# Patient Record
Sex: Male | Born: 1950
Health system: Southern US, Community
[De-identification: ages and names within clinical notes are randomized; demographics above are authoritative.]

## PROBLEM LIST (undated history)

## (undated) DIAGNOSIS — G4733 Obstructive sleep apnea (adult) (pediatric): Secondary | ICD-10-CM

## (undated) DIAGNOSIS — Z974 Presence of external hearing-aid: Secondary | ICD-10-CM

## (undated) DIAGNOSIS — K649 Unspecified hemorrhoids: Secondary | ICD-10-CM

## (undated) DIAGNOSIS — I4901 Ventricular fibrillation: Secondary | ICD-10-CM

## (undated) DIAGNOSIS — C4490 Unspecified malignant neoplasm of skin, unspecified: Secondary | ICD-10-CM

## (undated) DIAGNOSIS — K219 Gastro-esophageal reflux disease without esophagitis: Secondary | ICD-10-CM

## (undated) DIAGNOSIS — M1990 Unspecified osteoarthritis, unspecified site: Secondary | ICD-10-CM

## (undated) DIAGNOSIS — I255 Ischemic cardiomyopathy: Secondary | ICD-10-CM

## (undated) DIAGNOSIS — R0601 Orthopnea: Secondary | ICD-10-CM

## (undated) DIAGNOSIS — I469 Cardiac arrest, cause unspecified: Secondary | ICD-10-CM

## (undated) DIAGNOSIS — J45909 Unspecified asthma, uncomplicated: Secondary | ICD-10-CM

## (undated) DIAGNOSIS — H9190 Unspecified hearing loss, unspecified ear: Secondary | ICD-10-CM

## (undated) DIAGNOSIS — I2510 Atherosclerotic heart disease of native coronary artery without angina pectoris: Secondary | ICD-10-CM

## (undated) DIAGNOSIS — I10 Essential (primary) hypertension: Secondary | ICD-10-CM

## (undated) DIAGNOSIS — E119 Type 2 diabetes mellitus without complications: Secondary | ICD-10-CM

## (undated) DIAGNOSIS — H269 Unspecified cataract: Secondary | ICD-10-CM

## (undated) DIAGNOSIS — E785 Hyperlipidemia, unspecified: Secondary | ICD-10-CM

## (undated) DIAGNOSIS — I2109 ST elevation (STEMI) myocardial infarction involving other coronary artery of anterior wall: Secondary | ICD-10-CM

## (undated) HISTORY — PX: CORONARY ANGIOPLASTY: SHX604

## (undated) HISTORY — PX: EYE SURGERY: SHX253

## (undated) HISTORY — PX: CHOLECYSTECTOMY: SHX55

## (undated) HISTORY — PX: OTHER SURGICAL HISTORY: SHX169

---

## 2004-10-04 ENCOUNTER — Ambulatory Visit: Payer: Self-pay | Admitting: Internal Medicine

## 2006-03-15 ENCOUNTER — Ambulatory Visit: Payer: Self-pay

## 2007-07-22 ENCOUNTER — Ambulatory Visit: Payer: Self-pay

## 2007-07-25 DIAGNOSIS — Z85828 Personal history of other malignant neoplasm of skin: Secondary | ICD-10-CM

## 2007-07-25 HISTORY — DX: Personal history of other malignant neoplasm of skin: Z85.828

## 2007-07-29 ENCOUNTER — Ambulatory Visit: Payer: Self-pay

## 2008-06-04 ENCOUNTER — Ambulatory Visit: Payer: Self-pay | Admitting: Internal Medicine

## 2008-10-15 ENCOUNTER — Ambulatory Visit: Payer: Self-pay | Admitting: General Practice

## 2009-04-15 ENCOUNTER — Ambulatory Visit: Payer: Self-pay | Admitting: Gastroenterology

## 2009-05-24 ENCOUNTER — Ambulatory Visit: Payer: Self-pay | Admitting: General Practice

## 2009-11-08 ENCOUNTER — Ambulatory Visit: Payer: Self-pay | Admitting: Internal Medicine

## 2012-04-10 ENCOUNTER — Ambulatory Visit: Payer: Self-pay | Admitting: Internal Medicine

## 2012-11-27 HISTORY — PX: COLONOSCOPY: SHX174

## 2013-08-26 ENCOUNTER — Ambulatory Visit: Payer: Self-pay | Admitting: Internal Medicine

## 2014-04-09 DIAGNOSIS — K219 Gastro-esophageal reflux disease without esophagitis: Secondary | ICD-10-CM | POA: Insufficient documentation

## 2016-03-31 NOTE — H&P (Signed)
  See scanned notes. 

## 2016-04-03 ENCOUNTER — Ambulatory Visit
Admission: RE | Admit: 2016-04-03 | Discharge: 2016-04-03 | Disposition: A | Discharge status: Home / Self Care | Payer: BLUE CROSS/BLUE SHIELD | Source: Ambulatory Visit | Attending: Ophthalmology | Admitting: Ophthalmology

## 2016-04-03 ENCOUNTER — Encounter: Payer: Self-pay | Admitting: *Deleted

## 2016-04-03 ENCOUNTER — Ambulatory Visit: Payer: BLUE CROSS/BLUE SHIELD | Admitting: Certified Registered Nurse Anesthetist

## 2016-04-03 ENCOUNTER — Encounter
Admission: RE | Disposition: A | Discharge status: Home / Self Care | Payer: Self-pay | Source: Ambulatory Visit | Attending: Ophthalmology

## 2016-04-03 DIAGNOSIS — I10 Essential (primary) hypertension: Secondary | ICD-10-CM | POA: Insufficient documentation

## 2016-04-03 DIAGNOSIS — H2511 Age-related nuclear cataract, right eye: Secondary | ICD-10-CM | POA: Insufficient documentation

## 2016-04-03 DIAGNOSIS — M1990 Unspecified osteoarthritis, unspecified site: Secondary | ICD-10-CM | POA: Diagnosis not present

## 2016-04-03 DIAGNOSIS — G4730 Sleep apnea, unspecified: Secondary | ICD-10-CM | POA: Diagnosis not present

## 2016-04-03 DIAGNOSIS — J45909 Unspecified asthma, uncomplicated: Secondary | ICD-10-CM | POA: Insufficient documentation

## 2016-04-03 HISTORY — DX: Unspecified osteoarthritis, unspecified site: M19.90

## 2016-04-03 HISTORY — DX: Orthopnea: R06.01

## 2016-04-03 HISTORY — DX: Unspecified malignant neoplasm of skin, unspecified: C44.90

## 2016-04-03 HISTORY — PX: CATARACT EXTRACTION W/PHACO: SHX586

## 2016-04-03 HISTORY — DX: Unspecified asthma, uncomplicated: J45.909

## 2016-04-03 HISTORY — DX: Gastro-esophageal reflux disease without esophagitis: K21.9

## 2016-04-03 HISTORY — DX: Unspecified hearing loss, unspecified ear: H91.90

## 2016-04-03 HISTORY — DX: Type 2 diabetes mellitus without complications: E11.9

## 2016-04-03 LAB — GLUCOSE, CAPILLARY: Glucose-Capillary: 119 mg/dL — ABNORMAL HIGH (ref 65–99)

## 2016-04-03 SURGERY — PHACOEMULSIFICATION, CATARACT, WITH IOL INSERTION
Anesthesia: Monitor Anesthesia Care | Site: Eye | Laterality: Right | Wound class: Clean

## 2016-04-03 MED ORDER — MOXIFLOXACIN HCL 0.5 % OP SOLN
1.0000 [drp] | OPHTHALMIC | Status: AC
  Administered 2016-04-03 (×2): 1 [drp] via OPHTHALMIC

## 2016-04-03 MED ORDER — CEFUROXIME OPHTHALMIC INJECTION 1 MG/0.1 ML
INJECTION | OPHTHALMIC | Status: AC
  Filled 2016-04-03: qty 0.1

## 2016-04-03 MED ORDER — NA CHONDROIT SULF-NA HYALURON 40-17 MG/ML IO SOLN
INTRAOCULAR | Status: DC | PRN
  Administered 2016-04-03: 1 mL via INTRAOCULAR

## 2016-04-03 MED ORDER — LIDOCAINE HCL (PF) 4 % IJ SOLN
INTRAMUSCULAR | Status: AC
  Filled 2016-04-03: qty 5

## 2016-04-03 MED ORDER — MOXIFLOXACIN HCL 0.5 % OP SOLN
OPHTHALMIC | Status: DC | PRN
  Administered 2016-04-03: 1 [drp] via OPHTHALMIC

## 2016-04-03 MED ORDER — POVIDONE-IODINE 5 % OP SOLN
OPHTHALMIC | Status: DC | PRN
  Administered 2016-04-03: 1 via OPHTHALMIC

## 2016-04-03 MED ORDER — LIDOCAINE HCL (PF) 4 % IJ SOLN
INTRAOCULAR | Status: DC | PRN
  Administered 2016-04-03: .5 mL via OPHTHALMIC

## 2016-04-03 MED ORDER — PHENYLEPHRINE HCL 10 % OP SOLN
1.0000 [drp] | Freq: Once | OPHTHALMIC | Status: AC
  Administered 2016-04-03: 1 [drp] via OPHTHALMIC

## 2016-04-03 MED ORDER — CARBACHOL 0.01 % IO SOLN
INTRAOCULAR | Status: DC | PRN
  Administered 2016-04-03: .5 mL via INTRAOCULAR

## 2016-04-03 MED ORDER — NA CHONDROIT SULF-NA HYALURON 40-17 MG/ML IO SOLN
INTRAOCULAR | Status: AC
  Filled 2016-04-03: qty 1

## 2016-04-03 MED ORDER — BUPIVACAINE HCL (PF) 0.75 % IJ SOLN
INTRAMUSCULAR | Status: AC
  Filled 2016-04-03: qty 10

## 2016-04-03 MED ORDER — MOXIFLOXACIN HCL 0.5 % OP SOLN
1.0000 [drp] | OPHTHALMIC | Status: DC | PRN
  Administered 2016-04-03: 1 [drp] via OPHTHALMIC

## 2016-04-03 MED ORDER — EPINEPHRINE HCL 1 MG/ML IJ SOLN
INTRAMUSCULAR | Status: AC
  Filled 2016-04-03: qty 2

## 2016-04-03 MED ORDER — POVIDONE-IODINE 5 % OP SOLN
OPHTHALMIC | Status: AC
  Filled 2016-04-03: qty 30

## 2016-04-03 MED ORDER — CYCLOPENTOLATE HCL 2 % OP SOLN
OPHTHALMIC | Status: AC
  Administered 2016-04-03: 1 [drp] via OPHTHALMIC
  Filled 2016-04-03: qty 2

## 2016-04-03 MED ORDER — TETRACAINE HCL 0.5 % OP SOLN
OPHTHALMIC | Status: DC | PRN
  Administered 2016-04-03: 1 [drp] via OPHTHALMIC

## 2016-04-03 MED ORDER — PHENYLEPHRINE HCL 10 % OP SOLN
1.0000 [drp] | OPHTHALMIC | Status: AC
  Administered 2016-04-03 (×3): 1 [drp] via OPHTHALMIC

## 2016-04-03 MED ORDER — MIDAZOLAM HCL 2 MG/2ML IJ SOLN
INTRAMUSCULAR | Status: DC | PRN
  Administered 2016-04-03: 1 mg via INTRAVENOUS

## 2016-04-03 MED ORDER — TETRACAINE HCL 0.5 % OP SOLN
OPHTHALMIC | Status: AC
  Filled 2016-04-03: qty 2

## 2016-04-03 MED ORDER — HYALURONIDASE HUMAN 150 UNIT/ML IJ SOLN
INTRAMUSCULAR | Status: AC
  Filled 2016-04-03: qty 1

## 2016-04-03 MED ORDER — MOXIFLOXACIN HCL 0.5 % OP SOLN
OPHTHALMIC | Status: AC
  Administered 2016-04-03: 1 [drp] via OPHTHALMIC
  Filled 2016-04-03: qty 3

## 2016-04-03 MED ORDER — PHENYLEPHRINE HCL 10 % OP SOLN
OPHTHALMIC | Status: AC
  Administered 2016-04-03: 1 [drp] via OPHTHALMIC
  Filled 2016-04-03: qty 5

## 2016-04-03 MED ORDER — EPINEPHRINE HCL 1 MG/ML IJ SOLN
INTRAOCULAR | Status: DC | PRN
  Administered 2016-04-03: 1 mL via OPHTHALMIC

## 2016-04-03 MED ORDER — CEFUROXIME OPHTHALMIC INJECTION 1 MG/0.1 ML
INJECTION | OPHTHALMIC | Status: DC | PRN
  Administered 2016-04-03: .1 mL via INTRACAMERAL

## 2016-04-03 MED ORDER — SODIUM CHLORIDE 0.9 % IV SOLN
INTRAVENOUS | Status: DC
  Administered 2016-04-03: 75 mL/h via INTRAVENOUS

## 2016-04-03 MED ORDER — LIDOCAINE HCL (PF) 4 % IJ SOLN
INTRAMUSCULAR | Status: DC | PRN
  Administered 2016-04-03: 4 mL via OPHTHALMIC

## 2016-04-03 MED ORDER — CYCLOPENTOLATE HCL 2 % OP SOLN
1.0000 [drp] | Freq: Once | OPHTHALMIC | Status: AC
  Administered 2016-04-03: 1 [drp] via OPHTHALMIC

## 2016-04-03 MED ORDER — CYCLOPENTOLATE HCL 2 % OP SOLN
1.0000 [drp] | OPHTHALMIC | Status: AC
  Administered 2016-04-03 (×3): 1 [drp] via OPHTHALMIC

## 2016-04-03 MED ORDER — ALFENTANIL 500 MCG/ML IJ INJ
INJECTION | INTRAMUSCULAR | Status: DC | PRN
  Administered 2016-04-03: 200 ug via INTRAVENOUS
  Administered 2016-04-03: 500 ug via INTRAVENOUS

## 2016-04-03 SURGICAL SUPPLY — 29 items
CANNULA ANT/CHMB 27GA (MISCELLANEOUS) ×2 IMPLANT
CORD BIP STRL DISP 12FT (MISCELLANEOUS) ×2 IMPLANT
CUP MEDICINE 2OZ PLAST GRAD ST (MISCELLANEOUS) ×2 IMPLANT
DRAPE XRAY CASSETTE 23X24 (DRAPES) ×2 IMPLANT
ERASER HMR WETFIELD 18G (MISCELLANEOUS) ×2 IMPLANT
GLOVE BIO SURGEON STRL SZ8 (GLOVE) ×2 IMPLANT
GLOVE SURG LX 6.5 MICRO (GLOVE) ×1
GLOVE SURG LX 8.0 MICRO (GLOVE) ×1
GLOVE SURG LX STRL 6.5 MICRO (GLOVE) ×1 IMPLANT
GLOVE SURG LX STRL 8.0 MICRO (GLOVE) ×1 IMPLANT
GOWN STRL REUS W/ TWL LRG LVL3 (GOWN DISPOSABLE) ×1 IMPLANT
GOWN STRL REUS W/ TWL XL LVL3 (GOWN DISPOSABLE) ×1 IMPLANT
GOWN STRL REUS W/TWL LRG LVL3 (GOWN DISPOSABLE) ×1
GOWN STRL REUS W/TWL XL LVL3 (GOWN DISPOSABLE) ×1
LENS IOL ACRYSOF IQ 20.5 (Intraocular Lens) ×2 IMPLANT
PACK CATARACT (MISCELLANEOUS) ×2 IMPLANT
PACK CATARACT DINGLEDEIN LX (MISCELLANEOUS) ×2 IMPLANT
PACK EYE AFTER SURG (MISCELLANEOUS) ×2 IMPLANT
SHLD EYE VISITEC  UNIV (MISCELLANEOUS) ×2 IMPLANT
SOL BSS BAG (MISCELLANEOUS) ×2
SOL PREP PVP 2OZ (MISCELLANEOUS) ×2
SOLUTION BSS BAG (MISCELLANEOUS) ×1 IMPLANT
SOLUTION PREP PVP 2OZ (MISCELLANEOUS) ×1 IMPLANT
SUT SILK 5-0 (SUTURE) ×2 IMPLANT
SYR 3ML LL SCALE MARK (SYRINGE) ×2 IMPLANT
SYR 5ML LL (SYRINGE) ×2 IMPLANT
SYR TB 1ML 27GX1/2 LL (SYRINGE) ×2 IMPLANT
WATER STERILE IRR 1000ML POUR (IV SOLUTION) ×2 IMPLANT
WIPE NON LINTING 3.25X3.25 (MISCELLANEOUS) ×2 IMPLANT

## 2016-04-03 NOTE — Discharge Instructions (Addendum)
See handout Eye Surgery Discharge Instructions  Expect mild scratchy sensation or mild soreness. DO NOT RUB YOUR EYE!  The day of surgery:  Minimal physical activity, but bed rest is not required  No reading, computer work, or close hand work  No bending, lifting, or straining.  May watch TV  For 24 hours:  No driving, legal decisions, or alcoholic beverages  Safety precautions  Eat anything you prefer: It is better to start with liquids, then soup then solid foods.  _____ Eye patch should be worn until postoperative exam tomorrow.  ____ Solar shield eyeglasses should be worn for comfort in the sunlight/patch while sleeping  Resume all regular medications including aspirin or Coumadin if these were discontinued prior to surgery. You may shower, bathe, shave, or wash your hair. Tylenol may be taken for mild discomfort.  Call your doctor if you experience significant pain, nausea, or vomiting, fever > 101 or other signs of infection. [phone] or [phone] Specific instructions:  Follow-up Information    Follow up with Estill Cotta, MD.   Specialty:  Ophthalmology   Why:  5-09-17u at 11:00   Contact information:   [place] [address] [phone]      Eye Surgery Discharge Instructions  Expect mild scratchy sensation or mild soreness. DO NOT RUB YOUR EYE!  The day of surgery:  Minimal physical activity, but bed rest is not required  No reading, computer work, or close hand work  No bending, lifting, or straining.  May watch TV  For 24 hours:  No driving, legal decisions, or alcoholic beverages  Safety precautions  Eat anything you prefer: It is better to start with liquids, then soup then solid foods.  _____ Eye patch should be worn until postoperative exam tomorrow.  ____ Solar shield eyeglasses should be worn for comfort in the sunlight/patch while sleeping  Resume all regular medications including aspirin or  Coumadin if these were discontinued prior to surgery. You may shower, bathe, shave, or wash your hair. Tylenol may be taken for mild discomfort.  Call your doctor if you experience significant pain, nausea, or vomiting, fever > 101 or other signs of infection. [phone] or [phone] Specific instructions:  Follow-up Information    Follow up with Estill Cotta, MD.   Specialty:  Ophthalmology   Why:  5-09-17u at 11:00   Contact information:   [address]   [place] [address] [phone]

## 2016-04-03 NOTE — Interval H&P Note (Signed)
History and Physical Interval Note:  04/03/2016 9:00 AM  Bobby Tanner  has presented today for surgery, with the diagnosis of CATARACT  The various methods of treatment have been discussed with the patient and family. After consideration of risks, benefits and other options for treatment, the patient has consented to  Procedure(s): CATARACT EXTRACTION PHACO AND INTRAOCULAR LENS PLACEMENT ([place]) (Right) as a surgical intervention .  The patient's history has been reviewed, patient examined, no change in status, stable for surgery.  I have reviewed the patient's chart and labs.  Questions were answered to the patient's satisfaction.     Shandon Matson

## 2016-04-03 NOTE — Anesthesia Postprocedure Evaluation (Signed)
Anesthesia Post Note  Patient: Bobby Tanner  Procedure(s) Performed: Procedure(s) (LRB): CATARACT EXTRACTION PHACO AND INTRAOCULAR LENS PLACEMENT ([facility]) (Right)  Patient location during evaluation: PACU Anesthesia Type: MAC Level of consciousness: awake and alert and oriented Pain management: satisfactory to patient Vital Signs Assessment: post-procedure vital signs reviewed and stable Respiratory status: respiratory function stable Cardiovascular status: stable    Last Vitals:  Filed Vitals:   04/03/16 0743  BP: 130/74  Pulse: 67  Temp: 37.6 C  Resp: 18    Last Pain: There were no vitals filed for this visit.               Blima Singer

## 2016-04-03 NOTE — Anesthesia Procedure Notes (Signed)
Performed by: Daneli Butkiewicz Pre-anesthesia Checklist: Patient identified, Emergency Drugs available, Suction available, Patient being monitored and Timeout performed Oxygen Delivery Method: Nasal cannula       

## 2016-04-03 NOTE — Transfer of Care (Signed)
Immediate Anesthesia Transfer of Care Note  Patient: Bobby Tanner  Procedure(s) Performed: Procedure(s) with comments: CATARACT EXTRACTION PHACO AND INTRAOCULAR LENS PLACEMENT ([facility]) (Right) - [place] 01:13 AP% 25.8 CDE 34.18 fluid pack lot [id number]  Patient Location: PACU  Anesthesia Type:MAC  Level of Consciousness: awake, alert  and oriented  Airway & Oxygen Therapy: Patient Spontanous Breathing  Post-op Assessment: Report given to RN and Post -op Vital signs reviewed and stable  Post vital signs: Reviewed and stable  Last Vitals:  Filed Vitals:   04/03/16 0743  BP: 130/74  Pulse: 67  Temp: 37.6 C  Resp: 18    Last Pain: There were no vitals filed for this visit.    Patients Stated Pain Goal: 0 ([id number])  Complications: No apparent anesthesia complications

## 2016-04-03 NOTE — Op Note (Signed)
Date of Surgery: 04/03/2016 Date of Dictation: 04/03/2016 9:45 AM Pre-operative Diagnosis:  Nuclear Sclerotic Cataract right Eye Post-operative Diagnosis: same Procedure performed: Extra-capsular Cataract Extraction (ECCE) with placement of a posterior chamber intraocular lens (IOL) right Eye IOL:  Implant Name Type Inv. Item Serial No. Manufacturer Lot No. LRB No. Used  LENS IOL ACRYSOF IQ 20.5 - [id number] Intraocular Lens LENS IOL ACRYSOF IQ 20.5 [id number] ALCON   Right 1   Anesthesia: 2% Lidocaine and 4% Marcaine in a 50/50 mixture with 10 unites/ml of Hylenex given as a peribulbar Anesthesiologist: Anesthesiologist: Gunnar Bulla, MD CRNA: Demetrius Charity, CRNA Complications: none Estimated Blood Loss: less than 1 ml  Description of procedure:  The patient was given anesthesia and sedation via intravenous access. The patient was then prepped and draped in the usual fashion. A 25-gauge needle was bent for initiating the capsulorhexis. A 5-0 silk suture was placed through the conjunctiva superior and inferiorly to serve as bridle sutures. Hemostasis was obtained at the superior limbus using an eraser cautery. A partial thickness groove was made at the anterior surgical limbus with a 64 Beaver blade and this was dissected anteriorly with an [employer]aya. The anterior chamber was entered at 10 o'clock with a 1.0 mm paracentesis knife and through the lamellar dissection with a 2.6 mm Alcon keratome. Epi-Shugarcaine 0.5 CC [9 cc BSS Plus (Alcon), 3 cc 4% preservative-free lidocaine (Hospira) and 4 cc 1:1000 preservative-free, bisulfite-free epinephrine] was injected into the anterior chamber via the paracentesis tract. Epi-Shugarcaine 0.5 CC [9 cc BSS Plus (Alcon), 3 cc 4% preservative-free lidocaine (Hospira) and 4 cc 1:1000 preservative-free, bisulfite-free epinephrine] was injected into the anterior chamber via the paracentesis tract. DiscoVisc was injected to replace the aqueous and a  continuous tear curvilinear capsulorhexis was performed using a bent 25-gauge needle.  Balance salt on a syringe was used to perform hydro-dissection and phacoemulsification was carried out using a divide and conquer technique. Procedure(s) with comments: CATARACT EXTRACTION PHACO AND INTRAOCULAR LENS PLACEMENT ([facility]) (Right) - [place] 01:13 AP% 25.8 CDE 34.18 fluid pack lot [id number]. Irrigation/aspiration was used to remove the residual cortex and the capsular bag was inflated with DiscoVisc. The intraocular lens was inserted into the capsular bag using a pre-loaded UltraSert Delivery System. Irrigation/aspiration was used to remove the residual DiscoVisc. The wound was inflated with balanced salt and checked for leaks. None were found. Miostat was injected via the paracentesis track and 0.1 ml of cefuroxime containing 1 mg of drug  was injected via the paracentesis track. The wound was checked for leaks again and none were found.   The bridal sutures were removed and two drops of Vigamox were placed on the eye. An eye shield was placed to protect the eye and the patient was discharged to the recovery area in good condition.   Julian Askin MD

## 2016-04-03 NOTE — Anesthesia Preprocedure Evaluation (Signed)
Anesthesia Evaluation  Patient identified by MRN, date of birth, ID band Patient awake    Reviewed: Allergy & Precautions, NPO status , Patient's Chart, lab work & pertinent test results, reviewed documented beta blocker date and time   Airway Mallampati: III  TM Distance: >3 FB     Dental  (+) Chipped   Pulmonary shortness of breath, asthma , sleep apnea ,           Cardiovascular hypertension, Pt. on medications + angina + Orthopnea       Neuro/Psych    GI/Hepatic GERD  Controlled,  Endo/Other    Renal/GU      Musculoskeletal  (+) Arthritis ,   Abdominal   Peds  Hematology   Anesthesia Other Findings   Reproductive/Obstetrics                             Anesthesia Physical Anesthesia Plan  ASA: III  Anesthesia Plan: General and MAC   Post-op Pain Management:    Induction:   Airway Management Planned:   Additional Equipment:   Intra-op Plan:   Post-operative Plan:   Informed Consent: I have reviewed the patients History and Physical, chart, labs and discussed the procedure including the risks, benefits and alternatives for the proposed anesthesia with the patient or authorized representative who has indicated his/her understanding and acceptance.     Plan Discussed with: CRNA  Anesthesia Plan Comments:         Anesthesia Quick Evaluation

## 2016-04-10 ENCOUNTER — Inpatient Hospital Stay (HOSPITAL_COMMUNITY)
Admit: 2016-04-10 | Discharge: 2016-04-10 | Disposition: A | Discharge status: Home / Self Care | Payer: BLUE CROSS/BLUE SHIELD | Attending: Nurse Practitioner | Admitting: Nurse Practitioner

## 2016-04-10 ENCOUNTER — Emergency Department: Payer: BLUE CROSS/BLUE SHIELD

## 2016-04-10 ENCOUNTER — Inpatient Hospital Stay
Admission: EM | Admit: 2016-04-10 | Discharge: 2016-04-12 | DRG: 246 | Disposition: A | Discharge status: Home / Self Care | Payer: BLUE CROSS/BLUE SHIELD | Attending: Cardiovascular Disease | Admitting: Cardiovascular Disease

## 2016-04-10 ENCOUNTER — Encounter
Admission: EM | Disposition: A | Discharge status: Home / Self Care | Payer: Self-pay | Source: Home / Self Care | Attending: Cardiovascular Disease

## 2016-04-10 DIAGNOSIS — I213 ST elevation (STEMI) myocardial infarction of unspecified site: Secondary | ICD-10-CM | POA: Diagnosis not present

## 2016-04-10 DIAGNOSIS — I2510 Atherosclerotic heart disease of native coronary artery without angina pectoris: Secondary | ICD-10-CM | POA: Diagnosis present

## 2016-04-10 DIAGNOSIS — D72829 Elevated white blood cell count, unspecified: Secondary | ICD-10-CM | POA: Diagnosis present

## 2016-04-10 DIAGNOSIS — K219 Gastro-esophageal reflux disease without esophagitis: Secondary | ICD-10-CM | POA: Diagnosis present

## 2016-04-10 DIAGNOSIS — E785 Hyperlipidemia, unspecified: Secondary | ICD-10-CM | POA: Diagnosis present

## 2016-04-10 DIAGNOSIS — Z6841 Body Mass Index (BMI) 40.0 and over, adult: Secondary | ICD-10-CM | POA: Diagnosis not present

## 2016-04-10 DIAGNOSIS — I2102 ST elevation (STEMI) myocardial infarction involving left anterior descending coronary artery: Secondary | ICD-10-CM | POA: Diagnosis not present

## 2016-04-10 DIAGNOSIS — I10 Essential (primary) hypertension: Secondary | ICD-10-CM | POA: Diagnosis present

## 2016-04-10 DIAGNOSIS — I462 Cardiac arrest due to underlying cardiac condition: Secondary | ICD-10-CM | POA: Diagnosis present

## 2016-04-10 DIAGNOSIS — Z8249 Family history of ischemic heart disease and other diseases of the circulatory system: Secondary | ICD-10-CM | POA: Diagnosis not present

## 2016-04-10 DIAGNOSIS — I472 Ventricular tachycardia, unspecified: Secondary | ICD-10-CM

## 2016-04-10 DIAGNOSIS — I469 Cardiac arrest, cause unspecified: Secondary | ICD-10-CM | POA: Diagnosis not present

## 2016-04-10 DIAGNOSIS — Z85828 Personal history of other malignant neoplasm of skin: Secondary | ICD-10-CM | POA: Diagnosis not present

## 2016-04-10 DIAGNOSIS — Z820 Family history of epilepsy and other diseases of the nervous system: Secondary | ICD-10-CM

## 2016-04-10 DIAGNOSIS — R079 Chest pain, unspecified: Secondary | ICD-10-CM | POA: Diagnosis present

## 2016-04-10 DIAGNOSIS — I2109 ST elevation (STEMI) myocardial infarction involving other coronary artery of anterior wall: Principal | ICD-10-CM

## 2016-04-10 DIAGNOSIS — R112 Nausea with vomiting, unspecified: Secondary | ICD-10-CM | POA: Diagnosis present

## 2016-04-10 DIAGNOSIS — H9190 Unspecified hearing loss, unspecified ear: Secondary | ICD-10-CM | POA: Diagnosis present

## 2016-04-10 DIAGNOSIS — G4733 Obstructive sleep apnea (adult) (pediatric): Secondary | ICD-10-CM | POA: Diagnosis present

## 2016-04-10 DIAGNOSIS — Z7982 Long term (current) use of aspirin: Secondary | ICD-10-CM | POA: Diagnosis not present

## 2016-04-10 DIAGNOSIS — Z79899 Other long term (current) drug therapy: Secondary | ICD-10-CM

## 2016-04-10 DIAGNOSIS — I4901 Ventricular fibrillation: Secondary | ICD-10-CM

## 2016-04-10 DIAGNOSIS — E119 Type 2 diabetes mellitus without complications: Secondary | ICD-10-CM

## 2016-04-10 DIAGNOSIS — I209 Angina pectoris, unspecified: Secondary | ICD-10-CM | POA: Diagnosis not present

## 2016-04-10 HISTORY — DX: Unspecified hemorrhoids: K64.9

## 2016-04-10 HISTORY — DX: Morbid (severe) obesity due to excess calories: E66.01

## 2016-04-10 HISTORY — DX: Ischemic cardiomyopathy: I25.5

## 2016-04-10 HISTORY — DX: Obstructive sleep apnea (adult) (pediatric): G47.33

## 2016-04-10 HISTORY — DX: Unspecified cataract: H26.9

## 2016-04-10 HISTORY — DX: Atherosclerotic heart disease of native coronary artery without angina pectoris: I25.10

## 2016-04-10 HISTORY — DX: Ventricular fibrillation: I49.01

## 2016-04-10 HISTORY — DX: ST elevation (STEMI) myocardial infarction involving other coronary artery of anterior wall: I21.09

## 2016-04-10 HISTORY — PX: CARDIAC CATHETERIZATION: SHX172

## 2016-04-10 HISTORY — DX: Cardiac arrest, cause unspecified: I46.9

## 2016-04-10 HISTORY — DX: Essential (primary) hypertension: I10

## 2016-04-10 HISTORY — DX: Hyperlipidemia, unspecified: E78.5

## 2016-04-10 LAB — BASIC METABOLIC PANEL
ANION GAP: 9 (ref 5–15)
BUN: 20 mg/dL (ref 6–20)
CALCIUM: 9.5 mg/dL (ref 8.9–10.3)
CO2: 21 mmol/L — AB (ref 22–32)
Chloride: 111 mmol/L (ref 101–111)
Creatinine, Ser: 1.2 mg/dL (ref 0.61–1.24)
GFR calc Af Amer: >60 mL/min (ref >60)
GFR calc non Af Amer: >60 mL/min (ref >60)
Glucose, Bld: 151 mg/dL — ABNORMAL HIGH (ref 65–99)
Potassium: 3.9 mmol/L (ref 3.5–5.1)
Sodium: 141 mmol/L (ref 135–145)

## 2016-04-10 LAB — CBC
HCT: 44.1 % (ref 40.0–52.0)
HEMATOCRIT: 45.4 % (ref 40.0–52.0)
HEMOGLOBIN: 15.3 g/dL (ref 13.0–18.0)
Hemoglobin: 14.7 g/dL (ref 13.0–18.0)
MCH: 29.7 pg (ref 26.0–34.0)
MCH: 30.3 pg (ref 26.0–34.0)
MCHC: 33.7 g/dL (ref 32.0–36.0)
MCHC: 33.3 g/dL (ref 32.0–36.0)
MCV: 88.1 fL (ref 80.0–100.0)
MCV: 91.2 fL (ref 80.0–100.0)
PLATELETS: 193 10*3/uL (ref 150–440)
Platelets: 202 10*3/uL (ref 150–440)
RBC: 5.15 MIL/uL (ref 4.40–5.90)
RBC: 4.83 MIL/uL (ref 4.40–5.90)
RDW: 13.4 % (ref 11.5–14.5)
RDW: 13.5 % (ref 11.5–14.5)
WBC: 15 10*3/uL — AB (ref 3.8–10.6)
WBC: 19.5 10*3/uL — ABNORMAL HIGH (ref 3.8–10.6)

## 2016-04-10 LAB — CREATININE, SERUM
CREATININE: 1.48 mg/dL — AB (ref 0.61–1.24)
GFR calc Af Amer: 56 mL/min — ABNORMAL LOW (ref >60)
GFR calc non Af Amer: 48 mL/min — ABNORMAL LOW (ref >60)

## 2016-04-10 LAB — MRSA PCR SCREENING: MRSA BY PCR: NEGATIVE

## 2016-04-10 LAB — GLUCOSE, CAPILLARY
GLUCOSE-CAPILLARY: 172 mg/dL — AB (ref 65–99)
Glucose-Capillary: 221 mg/dL — ABNORMAL HIGH (ref 65–99)

## 2016-04-10 LAB — TROPONIN I: TROPONIN I: 0.66 ng/mL — AB (ref <0.031)

## 2016-04-10 LAB — TSH: TSH: 2.357 u[IU]/mL (ref 0.350–4.500)

## 2016-04-10 LAB — APTT

## 2016-04-10 SURGERY — LEFT HEART CATH AND CORONARY ANGIOGRAPHY
Anesthesia: Moderate Sedation

## 2016-04-10 MED ORDER — BIVALIRUDIN 250 MG IV SOLR
250.0000 mg | INTRAVENOUS | Status: DC | PRN
  Administered 2016-04-10: 1.75 mg/kg/h via INTRAVENOUS

## 2016-04-10 MED ORDER — NITROGLYCERIN 5 MG/ML IV SOLN
INTRAVENOUS | Status: AC
  Filled 2016-04-10: qty 10

## 2016-04-10 MED ORDER — HEPARIN (PORCINE) IN NACL 2-0.9 UNIT/ML-% IJ SOLN
INTRAMUSCULAR | Status: AC
  Filled 2016-04-10: qty 500

## 2016-04-10 MED ORDER — HEPARIN SODIUM (PORCINE) 1000 UNIT/ML IJ SOLN
INTRAMUSCULAR | Status: AC
  Filled 2016-04-10: qty 1

## 2016-04-10 MED ORDER — ONDANSETRON HCL 4 MG/2ML IJ SOLN
4.0000 mg | Freq: Four times a day (QID) | INTRAMUSCULAR | Status: DC | PRN
  Filled 2016-04-10: qty 2

## 2016-04-10 MED ORDER — TICAGRELOR 90 MG PO TABS
ORAL_TABLET | ORAL | Status: DC | PRN
  Administered 2016-04-10: 180 mg via ORAL

## 2016-04-10 MED ORDER — FLUTICASONE PROPIONATE 50 MCG/ACT NA SUSP
2.0000 | Freq: Every day | NASAL | Status: DC
  Administered 2016-04-11: 2 via NASAL
  Filled 2016-04-10: qty 16

## 2016-04-10 MED ORDER — ASPIRIN EC 81 MG PO TBEC
DELAYED_RELEASE_TABLET | ORAL | Status: AC
  Filled 2016-04-10: qty 1

## 2016-04-10 MED ORDER — ONDANSETRON HCL 4 MG/2ML IJ SOLN
4.0000 mg | Freq: Once | INTRAMUSCULAR | Status: AC
  Administered 2016-04-10: 4 mg via INTRAVENOUS
  Filled 2016-04-10: qty 2

## 2016-04-10 MED ORDER — IRBESARTAN 150 MG PO TABS
150.0000 mg | ORAL_TABLET | Freq: Every day | ORAL | Status: DC
  Administered 2016-04-11 – 2016-04-12 (×2): 150 mg via ORAL
  Filled 2016-04-10: qty 2
  Filled 2016-04-10: qty 1

## 2016-04-10 MED ORDER — SODIUM CHLORIDE 0.9% FLUSH: 3.0000 mL | INTRAVENOUS | Status: DC | PRN

## 2016-04-10 MED ORDER — NITROGLYCERIN 1 MG/10 ML FOR IR/CATH LAB
INTRA_ARTERIAL | Status: DC | PRN
  Administered 2016-04-10: 200 ug

## 2016-04-10 MED ORDER — ONDANSETRON HCL 4 MG/2ML IJ SOLN: 4.0000 mg | Freq: Four times a day (QID) | INTRAMUSCULAR | Status: DC | PRN

## 2016-04-10 MED ORDER — BIVALIRUDIN 250 MG IV SOLR
INTRAVENOUS | Status: AC
  Filled 2016-04-10: qty 250

## 2016-04-10 MED ORDER — HEPARIN BOLUS VIA INFUSION
4000.0000 [IU] | Freq: Once | INTRAVENOUS | Status: AC
  Administered 2016-04-10: 4000 [IU] via INTRAVENOUS
  Filled 2016-04-10: qty 4000

## 2016-04-10 MED ORDER — FENTANYL CITRATE (PF) 100 MCG/2ML IJ SOLN
INTRAMUSCULAR | Status: AC
  Filled 2016-04-10: qty 2

## 2016-04-10 MED ORDER — BIVALIRUDIN BOLUS VIA INFUSION - CUPID
INTRAVENOUS | Status: DC | PRN
  Administered 2016-04-10: 102.075 mg via INTRAVENOUS

## 2016-04-10 MED ORDER — VERAPAMIL HCL 2.5 MG/ML IV SOLN
INTRAVENOUS | Status: AC
  Filled 2016-04-10: qty 2

## 2016-04-10 MED ORDER — ASPIRIN EC 81 MG PO TBEC
162.0000 mg | DELAYED_RELEASE_TABLET | Freq: Once | ORAL | Status: AC
  Administered 2016-04-10: 162 mg via ORAL
  Filled 2016-04-10: qty 2

## 2016-04-10 MED ORDER — NITROGLYCERIN 0.4 MG SL SUBL
0.4000 mg | SUBLINGUAL_TABLET | SUBLINGUAL | Status: DC | PRN
  Administered 2016-04-10 (×2): 0.4 mg via SUBLINGUAL
  Filled 2016-04-10: qty 1

## 2016-04-10 MED ORDER — CARVEDILOL 3.125 MG PO TABS
3.1250 mg | ORAL_TABLET | Freq: Two times a day (BID) | ORAL | Status: DC
  Administered 2016-04-11 – 2016-04-12 (×3): 3.125 mg via ORAL
  Filled 2016-04-10 (×3): qty 1

## 2016-04-10 MED ORDER — ONDANSETRON HCL 4 MG/2ML IJ SOLN
INTRAMUSCULAR | Status: AC
  Filled 2016-04-10: qty 2

## 2016-04-10 MED ORDER — PANTOPRAZOLE SODIUM 40 MG PO TBEC
40.0000 mg | DELAYED_RELEASE_TABLET | Freq: Every day | ORAL | Status: DC
  Administered 2016-04-11 – 2016-04-12 (×2): 40 mg via ORAL
  Filled 2016-04-10 (×2): qty 1

## 2016-04-10 MED ORDER — IOPAMIDOL (ISOVUE-300) INJECTION 61%
INTRAVENOUS | Status: DC | PRN
  Administered 2016-04-10: 140 mL via INTRAVENOUS

## 2016-04-10 MED ORDER — TICAGRELOR 90 MG PO TABS
90.0000 mg | ORAL_TABLET | Freq: Two times a day (BID) | ORAL | Status: DC
  Administered 2016-04-10 – 2016-04-12 (×4): 90 mg via ORAL
  Filled 2016-04-10 (×4): qty 1

## 2016-04-10 MED ORDER — VERAPAMIL HCL 2.5 MG/ML IV SOLN
INTRAVENOUS | Status: DC | PRN
  Administered 2016-04-10: 2.5 mg via INTRAVENOUS

## 2016-04-10 MED ORDER — INSULIN ASPART 100 UNIT/ML ~~LOC~~ SOLN
0.0000 [IU] | Freq: Three times a day (TID) | SUBCUTANEOUS | Status: DC
  Administered 2016-04-10: 5 [IU] via SUBCUTANEOUS
  Administered 2016-04-11 (×2): 2 [IU] via SUBCUTANEOUS
  Filled 2016-04-10: qty 5
  Filled 2016-04-10 (×2): qty 2

## 2016-04-10 MED ORDER — ONDANSETRON HCL 4 MG/2ML IJ SOLN
INTRAMUSCULAR | Status: DC | PRN
  Administered 2016-04-10: 4 mg via INTRAVENOUS

## 2016-04-10 MED ORDER — HEPARIN (PORCINE) IN NACL 100-0.45 UNIT/ML-% IJ SOLN
1400.0000 [IU]/h | INTRAMUSCULAR | Status: DC
  Filled 2016-04-10 (×2): qty 250

## 2016-04-10 MED ORDER — SODIUM CHLORIDE 0.9 % IV SOLN: 250.0000 mL | INTRAVENOUS | Status: DC | PRN

## 2016-04-10 MED ORDER — INSULIN ASPART 100 UNIT/ML ~~LOC~~ SOLN: 0.0000 [IU] | Freq: Three times a day (TID) | SUBCUTANEOUS | Status: DC

## 2016-04-10 MED ORDER — TIROFIBAN HCL IN NACL 5-0.9 MG/100ML-% IV SOLN
0.1500 ug/kg/min | INTRAVENOUS | Status: AC
  Administered 2016-04-10 (×2): 0.15 ug/kg/min via INTRAVENOUS
  Filled 2016-04-10 (×2): qty 100

## 2016-04-10 MED ORDER — TICAGRELOR 90 MG PO TABS
ORAL_TABLET | ORAL | Status: AC
  Filled 2016-04-10: qty 2

## 2016-04-10 MED ORDER — SODIUM CHLORIDE 0.9% FLUSH
3.0000 mL | Freq: Two times a day (BID) | INTRAVENOUS | Status: DC
  Administered 2016-04-10 – 2016-04-11 (×2): 3 mL via INTRAVENOUS

## 2016-04-10 MED ORDER — NITROGLYCERIN 0.4 MG SL SUBL: 0.4000 mg | SUBLINGUAL_TABLET | SUBLINGUAL | Status: DC | PRN

## 2016-04-10 MED ORDER — ACETAMINOPHEN 325 MG PO TABS: 650.0000 mg | ORAL_TABLET | ORAL | Status: DC | PRN

## 2016-04-10 MED ORDER — TIROFIBAN HCL IN NACL 5-0.9 MG/100ML-% IV SOLN: 0.1500 ug/kg/min | INTRAVENOUS | Status: DC

## 2016-04-10 MED ORDER — SODIUM CHLORIDE 0.9 % IV SOLN: INTRAVENOUS | Status: AC

## 2016-04-10 MED ORDER — TIROFIBAN HCL IN NACL 5-0.9 MG/100ML-% IV SOLN
INTRAVENOUS | Status: AC
  Filled 2016-04-10: qty 200

## 2016-04-10 MED ORDER — ATORVASTATIN CALCIUM 20 MG PO TABS
80.0000 mg | ORAL_TABLET | Freq: Every day | ORAL | Status: DC
  Administered 2016-04-11: 80 mg via ORAL
  Filled 2016-04-10 (×2): qty 4

## 2016-04-10 MED ORDER — SODIUM CHLORIDE 0.9% FLUSH
3.0000 mL | Freq: Two times a day (BID) | INTRAVENOUS | Status: DC
  Administered 2016-04-10 – 2016-04-11 (×3): 3 mL via INTRAVENOUS

## 2016-04-10 MED ORDER — ASPIRIN EC 81 MG PO TBEC
81.0000 mg | DELAYED_RELEASE_TABLET | Freq: Every day | ORAL | Status: DC
  Administered 2016-04-10 – 2016-04-12 (×3): 81 mg via ORAL
  Filled 2016-04-10 (×3): qty 1

## 2016-04-10 MED ORDER — MIDAZOLAM HCL 2 MG/2ML IJ SOLN
INTRAMUSCULAR | Status: AC
  Filled 2016-04-10: qty 2

## 2016-04-10 MED ORDER — HEPARIN SODIUM (PORCINE) 5000 UNIT/ML IJ SOLN
5000.0000 [IU] | Freq: Three times a day (TID) | INTRAMUSCULAR | Status: DC
  Administered 2016-04-10 – 2016-04-12 (×5): 5000 [IU] via SUBCUTANEOUS
  Filled 2016-04-10 (×7): qty 1

## 2016-04-10 MED ORDER — TIROFIBAN (AGGRASTAT) BOLUS VIA INFUSION
25.0000 ug/kg | Freq: Once | INTRAVENOUS | Status: AC
  Administered 2016-04-10: 3402.5 ug via INTRAVENOUS
  Filled 2016-04-10: qty 69

## 2016-04-10 SURGICAL SUPPLY — 13 items
CATH HEARTRAIL 6F IL3.5 (CATHETERS) ×3 IMPLANT
CATH INFINITI 5FR ANG PIGTAIL (CATHETERS) ×3 IMPLANT
CATH INFINITI JR4 5F (CATHETERS) ×3 IMPLANT
CATH PRIORITY ONE AC 6F (CATHETERS) ×3 IMPLANT
CATH VISTA GUIDE 6FR JL3.5 (CATHETERS) ×3 IMPLANT
DEVICE INFLAT 30 PLUS (MISCELLANEOUS) ×3 IMPLANT
DEVICE RAD COMP TR BAND LRG (VASCULAR PRODUCTS) ×3 IMPLANT
GLIDESHEATH SLEND SS 6F .021 (SHEATH) ×3 IMPLANT
KIT MANI 3VAL PERCEP (MISCELLANEOUS) ×3 IMPLANT
PACK CARDIAC CATH (CUSTOM PROCEDURE TRAY) ×3 IMPLANT
STENT XIENCE ALPINE RX 3.5X15 (Permanent Stent) ×3 IMPLANT
WIRE RUNTHROUGH .014X180CM (WIRE) ×3 IMPLANT
WIRE SAFE-T 1.5MM-J .035X260CM (WIRE) ×3 IMPLANT

## 2016-04-10 NOTE — ED Notes (Signed)
Chest pain that began today after doing some gardening

## 2016-04-10 NOTE — Progress Notes (Signed)
ANTICOAGULATION CONSULT NOTE - Initial Consult  Pharmacy Consult for heparin dosing Indication: chest pain/ACS  No Known Allergies  Patient Measurements: Height: 6' (182.9 cm) Weight: 300 lb (136.079 kg) IBW/kg (Calculated) : 77.6  Heparin dosing weight: 108kg  Vital Signs: Temp: 97.4 F (36.3 C) (05/15 1514) Temp Source: Oral (05/15 1514) BP: 138/61 mmHg (05/15 1514) Pulse Rate: 71 (05/15 1514)  Labs:  Recent Labs  04/10/16 1510  HGB 15.3  HCT 45.4  PLT 202  CREATININE 1.20  TROPONINI <0.03    Estimated Creatinine Clearance: 88.8 mL/min (by C-G formula based on Cr of 1.2).   Assessment: Pharmacy consulted to start heparin for ACS in this 65 year old male. Patient arresting in ED. Spoke with patients family, not on anticoagulation prior to admission. Baseline labs ordered.   Goal of Therapy:  Heparin level 0.3-0.7 units/ml Monitor platelets by anticoagulation protocol: Yes   Plan:  Heparin 4000 units IV x 1 followed by 1400 units/hour. Will check heparin level 6 hours after start of drip. CBC with AM labs.  Tiras Bianchini C 04/10/2016,3:54 PM

## 2016-04-10 NOTE — ED Provider Notes (Signed)
[facility] Emergency Department Provider Note  ____________________________________________  Time seen: Approximately 3:17 PM  I have reviewed the triage vital signs and the nursing notes.   HISTORY  Chief Complaint Chest Pain    HPI Bobby Tanner is a 65 y.o. male with a history of HTN, diet controlled diabetes, GERD, morbid obesity presenting with chest pain. The patient reports he was outside gardening in the heat for 2 hours and then developed a "burning" and "dull" central chest pain that did not radiate. He had associated shortness breath, diaphoresis, nausea and vomiting. No lightheadedness or palpitations. He went to lie down, and this made the pain worse. He took 2 low-dose aspirins and his antihypertensives after the pain started, and states that his pain has been easing off but is waxing and waning in character. He describes years of intermittent atypical chest pain that has not been worse over the last several weeks. At this time, the patient is feeling much better with improvement in his diaphoresis and chest pain. Blood sugar 120s by EMS.   Past Medical History  Diagnosis Date  . Obstructive sleep apnea     a. Compliant w/ CPAP.  Marland Kitchen Essential hypertension   . GERD (gastroesophageal reflux disease)   . Diet-controlled diabetes mellitus ([place])   . HOH (hard of hearing)   . Skin cancer     a. Basal cell carcinoma of neck s/p resection.  . Hyperlipidemia   . Anginal pain ([place])     a. Reports nl cardiac w/u ~ 2014.  . Orthopnea     a. Improved with CPAP.  Marland Kitchen Asthma     a. had as a child. pollen could aggrevate but does not use inhalers  . Arthritis     a. Hands  . Morbid obesity ([place])   . Hemorrhoids   . Cataract     a. 03/2016 s/p R cataract extraction and IOL placement.    Patient Active Problem List   Diagnosis Date Noted  . Acute ST elevation myocardial infarction (STEMI) involving left anterior descending coronary artery ([place])  04/10/2016  . Morbid obesity ([place])   . Hyperlipidemia   . Diet-controlled diabetes mellitus ([place])   . Essential hypertension   . Obstructive sleep apnea   . ST elevation myocardial infarction involving left anterior descending (LAD) coronary artery [facility])     Past Surgical History  Procedure Laterality Date  . Skin cancers      head  . Colonoscopy  2014  . Cataract extraction w/phaco Right 04/03/2016    Procedure: CATARACT EXTRACTION PHACO AND INTRAOCULAR LENS PLACEMENT ([facility]);  Surgeon: Estill Cotta, MD;  Location: [facility] ORS;  Service: Ophthalmology;  Laterality: Right;  [place] 01:13 AP% 25.8 CDE 34.18 fluid pack lot [id number]    No current outpatient prescriptions on file.  Allergies Review of patient's allergies indicates no known allergies.  Family History  Problem Relation Age of Onset  . CAD Father   . Hypertension Father   . Alzheimer's disease Mother   . Hypertension Paternal Grandfather   . Hypertension Brother     Social History Social History  Substance Use Topics  . Smoking status: Never Smoker   . Smokeless tobacco: None     Comment: no smokers in  home  . Alcohol Use: 0.0 oz/week    0 Standard drinks or equivalent per week     Comment: occasional drink every one or two weeks    Review of Systems Constitutional: No fever/chills.No lightheadedness or  syncope. Positive diaphoresis Eyes: No visual changes. ENT: No sore throat. No congestion or rhinorrhea. Cardiovascular: Positive central chest pain. Denies palpitations. Respiratory: Positive shortness of breath.  No cough. Gastrointestinal: No abdominal pain.  Positive nausea, positive vomiting.  No diarrhea.  No constipation. Genitourinary: Negative for dysuria. Musculoskeletal: Negative for back pain. Skin: Negative for rash. Neurological: Negative for headaches. No focal numbness, tingling or weakness.   10-point ROS otherwise negative.  ____________________________________________   PHYSICAL  EXAM:  VITAL SIGNS: ED Triage Vitals  Enc Vitals Group     BP 04/10/16 1514 138/61 mmHg     Pulse Rate 04/10/16 1514 71     Resp 04/10/16 1514 17     Temp 04/10/16 1514 97.4 F (36.3 C)     Temp Source 04/10/16 1514 Oral     SpO2 04/10/16 1514 96 %     Weight 04/10/16 1514 300 lb (136.079 kg)     Height 04/10/16 1514 6' (1.829 m)     Head Cir --      Peak Flow --      Pain Score 04/10/16 1515 2     Pain Loc --      Pain Edu? --      Excl. in [place]? --     Constitutional: Alert and oriented. Nontoxic. Answers questions appropriately. Eyes: Conjunctivae are normal.  EOMI. No scleral icterus. Head: Atraumatic. Nose: No congestion/rhinnorhea. Mouth/Throat: Mucous membranes are dry.  Neck: No stridor.  Supple.  No JVD. Cardiovascular: Normal rate, regular rhythm. No murmurs, rubs or gallops.  Respiratory: Normal respiratory effort.  No accessory muscle use or retractions. Lungs CTAB.  No wheezes, rales or ronchi. Gastrointestinal: Obese. Soft, nontender and nondistended.  No guarding or rebound.  No peritoneal signs. Musculoskeletal: No LE edema. No ttp in the calves or palpable cords.  Negative Homan's sign. Neurologic:  A&Ox3.  Speech is clear.  Face and smile are symmetric.  EOMI.  Moves all extremities well. Skin:  Skin is warm, dry and intact. No rash noted. Psychiatric: Mood and affect are normal. Speech and behavior are normal.  Normal judgement.  ____________________________________________   LABS (all labs ordered are listed, but only abnormal results are displayed)  Labs Reviewed  CBC - Abnormal; Notable for the following:    WBC 15.0 (*)    All other components within normal limits  BASIC METABOLIC PANEL - Abnormal; Notable for the following:    CO2 21 (*)    Glucose, Bld 151 (*)    All other components within normal limits  APTT - Abnormal; Notable for the following:    aPTT <24 (*)    All other components within normal limits  CBC - Abnormal; Notable for the  following:    WBC 19.5 (*)    All other components within normal limits  CREATININE, SERUM - Abnormal; Notable for the following:    Creatinine, Ser 1.48 (*)    GFR calc non Af Amer 48 (*)    GFR calc Af Amer 56 (*)    All other components within normal limits  TROPONIN I - Abnormal; Notable for the following:    Troponin I 0.66 (*)    All other components within normal limits  GLUCOSE, CAPILLARY - Abnormal; Notable for the following:    Glucose-Capillary 172 (*)    All other components within normal limits  GLUCOSE, CAPILLARY - Abnormal; Notable for the following:    Glucose-Capillary 221 (*)    All other components within normal limits  MRSA PCR SCREENING  TROPONIN I  TSH  COMPREHENSIVE METABOLIC PANEL  TROPONIN I  TROPONIN I  CBC   ____________________________________________  EKG  ED ECG REPORT I, Eula Listen, the attending physician, personally viewed and interpreted this ECG.   Date: 04/10/2016  EKG Time: 1508  Rate: 69  Rhythm: normal sinus rhythm  Axis: normal  Intervals:none  ST&T Change: No STEMI  Repeat EKG after vfib arrest s/p defibrillation: ED ECG REPORT I, Eula Listen, the attending physician, personally viewed and interpreted this ECG.   Date: 04/10/2016  EKG Time: 1549  Rate: 91  Rhythm: normal sinus rhythm  Axis: normal  Intervals:none  ST&T Change: STEMI w/ tombstone meaning ST elevation in V1 through V5 and reciprocal changes in II, III, and F aVF. This EKG is consistent with anterior acute MI.   ____________________________________________  RADIOLOGY  No results found.  ____________________________________________   PROCEDURES  Procedure(s) performed: None  Critical Care performed: Yes ____________________________________________   INITIAL IMPRESSION / ASSESSMENT AND PLAN / ED COURSE  Pertinent labs & imaging results that were available during my care of the patient were reviewed by me and considered in  my medical decision making (see chart for details).  65 y.o. male with a history of HTN, diet controlled DM, and GERD presenting with a central chest pain associated with shortness of breath, nausea and vomiting, who is mild ankle trouble appearing but stable at this time. Given the patient's multiple cardiac risk factors, I would be concerned about ACS or MI and will evaluate him for cardiac causes of pain. This could also possibly be reflux, especially given that his pain worsened when he lay down, but he does not endorse that this is typical of his GERD symptoms. The patient has not had any recent cardiac stress test, nor has he ever had a cardiac catheterization, so he will require full cardiac workup and admission even if his ER workup is reassuring.  ----------------------------------------- 4:17 PM on 04/10/2016 -----------------------------------------  CRITICAL CARE Performed by: Eula Listen   Total critical care time: 50 minutes  Critical care time was exclusive of separately billable procedures and treating other patients.  Critical care was necessary to treat or prevent imminent or life-threatening deterioration.  Critical care was time spent personally by me on the following activities: development of treatment plan with patient and/or surrogate as well as nursing, discussions with consultants, evaluation of patient's response to treatment, examination of patient, obtaining history from patient or surrogate, ordering and performing treatments and interventions, ordering and review of laboratory studies, ordering and review of radiographic studies, pulse oximetry and re-evaluation of patient's condition.    The patient continued to complain of worsening chest pain, despite nitroglycerin. He became grossly diaphoretic, then unresponsive. No pulse could be found so CPR was initiated. The patient had agonal breathing so bag-valve-mask was used to aid in his oxygenation. On  first check, the patient was in V. fib arrest, and defibrillation at 120 V was attempted with no response. A second and third defibrillation at 200 V was attempted which resulted in the patient beginning to mentate, stable blood pressure, and sinus rhythm with rates between 80s to 140s. The patient was given 150 mg of amiodarone and cardiology was immediately contacted.  A repeat EKG was obtained which showed profuse ST elevation, most notable in the anterior leads. The patient had received aspirin, and a heparin bolus was given.  The patient's rhythm evolved again, first into ventricular tachycardia, then into ventricular fibrillation. He  became unresponsive with no pulse was CPR was reinitiated and the patient received 5 defibrillatory shocks at 200 V.  He was given an additional 150 mg of amiodarone. Dr. Sophronia Simas, the cardiologist in call, arrived during CPR and the patient did regain responsiveness, pulse, and was immediately transported to the cardiac catheterization lab.  During the entirety of the patient's unstable condition, I was able to intermittently talked to the patient's wife, so that she was updated about the patient's condition and the plan for cardiac catheterization.  Cardiopulmonary Resuscitation (CPR) Procedure Note Directed/Performed by: Eula Listen I personally directed ancillary staff and/or performed CPR in an effort to regain return of spontaneous circulation and to maintain cardiac, neuro and systemic perfusion.       ____________________________________________  FINAL CLINICAL IMPRESSION(S) / ED DIAGNOSES  Final diagnoses:  ST elevation myocardial infarction involving left anterior descending (LAD) coronary artery ([facility])  Ventricular fibrillation ([place])  Cardiac arrest ([place])  Ventricular tachycardia ([place])      NEW MEDICATIONS STARTED DURING THIS VISIT:  Current Discharge Medication List       Eula Listen, MD 04/10/16 2325

## 2016-04-10 NOTE — ED Notes (Addendum)
1535 patient became unresponsive. MD made aware. Patient's rhythm showing v-fib. CPR started and patient prepared for shock. First shock administered at 1539 and one mg epi given and CPR resumed. Patient shocked again at 1540. CPR resumed. 1542 patient shocked again. Patient able to respond after that shock. Given 150 amiodarone at 1544. 1550 patient given 4000units heparin bolus. 1551 patient back in v-fib/tach , shock administered. CPR resumed. 1552 shock administered, CPR resumed another 150 amiodarone administered. Cardiology here to see patient. 1553 shock administered, CPR resumed. 1554 shock administered, CPR resumed. 1555 shock administered patient able to respond to verbal stimulation. Patient taken to cath lab via stretcher with staff.

## 2016-04-10 NOTE — Progress Notes (Signed)
°   04/10/16 1700  Clinical Encounter Type  Visited With Patient and family together  Visit Type Initial  Referral From Nurse  Consult/Referral To Chaplain  Pastoral care visit with family. [facility] Ext [phone]

## 2016-04-10 NOTE — Progress Notes (Signed)
Pt arrived to unit around 1745.  Pt is alert and oriented, nsr, lungs clear maintaining O2 Sats around 98%.  Tolerating liquids at this time.  Aggrastat infusing at 24.5 ml/hr, to infuse for 12 hrs per Dr. Fletcher Anon (discontinue at 0600 tomorrow am).  Right radial cath site clear of hematoma or bleeding.  TR band deflation time began at 1830, 3cc air let out q15 min, 1 ml of air remained in TR band at report off to Tess, RN resuming care of pt.

## 2016-04-10 NOTE — Progress Notes (Signed)
ANTICOAGULATION CONSULT NOTE - Initial Consult  Pharmacy Consult for Aggrastat  Indication: STEMI   No Known Allergies  Patient Measurements: Height: 6' (182.9 cm) Weight: (!) 315 lb 0.6 oz (142.9 kg) IBW/kg (Calculated) : 77.6 Heparin Dosing Weight:   Vital Signs: Temp: 97.8 F (36.6 C) (05/15 1745) Temp Source: Oral (05/15 1745) BP: 117/52 mmHg (05/15 1745) Pulse Rate: 70 (05/15 1745)  Labs:  Recent Labs  04/10/16 1510  HGB 15.3  HCT 45.4  PLT 202  APTT <24*  CREATININE 1.20  TROPONINI <0.03    Estimated Creatinine Clearance: 91.2 mL/min (by C-G formula based on Cr of 1.2).   Medical History: Past Medical History  Diagnosis Date  . Obstructive sleep apnea     a. Compliant w/ CPAP.  Marland Kitchen Essential hypertension   . GERD (gastroesophageal reflux disease)   . Diet-controlled diabetes mellitus ([place])   . HOH (hard of hearing)   . Skin cancer     a. Basal cell carcinoma of neck s/p resection.  . Hyperlipidemia   . Anginal pain ([place])     a. Reports nl cardiac w/u ~ 2014.  . Orthopnea     a. Improved with CPAP.  Marland Kitchen Asthma     a. had as a child. pollen could aggrevate but does not use inhalers  . Arthritis     a. Hands  . Morbid obesity ([place])   . Hemorrhoids   . Cataract     a. 03/2016 s/p R cataract extraction and IOL placement.    Medications:  Scheduled:  . aspirin EC      . aspirin EC  81 mg Oral Daily  . [START ON 04/11/2016] atorvastatin  80 mg Oral q1800  . [START ON 04/11/2016] carvedilol  3.125 mg Oral BID WC  . fluticasone  2 spray Each Nare Daily  . heparin      . heparin  5,000 Units Subcutaneous Q8H  . [START ON 04/11/2016] insulin aspart  0-15 Units Subcutaneous TID WC  . irbesartan  150 mg Oral Daily  . [START ON 04/11/2016] pantoprazole  40 mg Oral Q0600  . sodium chloride flush  3 mL Intravenous Q12H  . sodium chloride flush  3 mL Intravenous Q12H  . ticagrelor  90 mg Oral BID  . tirofiban  25 mcg/kg Intravenous Once     Assessment: Pharmacy consulted to dose Aggrastat in this 65 year old male admitted with STEMI.   Goal of Therapy:  Thrombus prophylaxis during PCI.    Plan:  Will order aggrastat 3402.5 mcg X 1 over 5 minutes followed by aggrastat 24.5 ml/hr X 12 hrs.   Roldan Laforest D 04/10/2016,6:17 PM

## 2016-04-10 NOTE — H&P (Signed)
History & Physical    Patient ID: Bobby Tanner MRN: [id number], DOB/AGE: 1951-04-01   Admit date: 04/10/2016   Primary Physician: Rusty Aus, MD Primary Cardiologist: new - seen by M. Fletcher Anon, MD   Patient Profile    65 y/o ? w/o prior cardiac hx who presented with c/p and suffered VF arrest  subsequently found to have anterior STEMI.  Past Medical History    Past Medical History  Diagnosis Date  . Obstructive sleep apnea     a. Compliant w/ CPAP.  Marland Kitchen Essential hypertension   . GERD (gastroesophageal reflux disease)   . Diet-controlled diabetes mellitus ([place])   . HOH (hard of hearing)   . Skin cancer     a. Basal cell carcinoma of neck s/p resection.  . Hyperlipidemia   . Anginal pain ([place])     a. Reports nl cardiac w/u ~ 2014.  . Orthopnea     a. Improved with CPAP.  Marland Kitchen Asthma     a. had as a child. pollen could aggrevate but does not use inhalers  . Arthritis     a. Hands  . Morbid obesity ([place])   . Hemorrhoids     Past Surgical History  Procedure Laterality Date  . Skin cancers      head  . Colonoscopy  2014  . Cataract extraction w/phaco Right 04/03/2016    Procedure: CATARACT EXTRACTION PHACO AND INTRAOCULAR LENS PLACEMENT (IOC);  Surgeon: Estill Cotta, MD;  Location: [facility] ORS;  Service: Ophthalmology;  Laterality: Right;  [place] 01:13 AP% 25.8 CDE 34.18 fluid pack lot [id number]     Allergies  No Known Allergies  History of Present Illness    65 y/o ? with a h/o HTN, HL, obesity, OSA, and diet controlled DM.  He reports a prior h/o chest pain, ~ 3 yrs ago, with negative cardiac w/u @ that time. No data available @ this time.  He lives locally with his wife and was doing some gardening today when he began to experience chest pain.  After two hours of ongoing Ss, he presented to the [facility] ED where initial ECG was non-acute and troponin was nl.  At 1535, he became unresponsive and was noted to be in VT.  CPR/ACLS initiated.  He required multiple defibs and  was given amio 150 mg, epi 1mg , and heparin 4K units.  He had ROSC and was noted to have marked anterior ST elevation on 15:49 ECG.  Code STEMI was called and pt had recurrent VT requiring at least two more defibs, the last of which being successful in restoring sinus rhythm, again with ant ST elevation noted.  At that point, he was taken to the cath lab for emergent diagnostic cath  ongoing.  Home Medications    Prior to Admission medications   Medication Sig Start Date End Date Taking? Authorizing Provider  aspirin EC 81 MG tablet Take 81 mg by mouth daily.    Historical Provider, MD  fluticasone (FLONASE) 50 MCG/ACT nasal spray Place 2 sprays into both nostrils daily.    Historical Provider, MD  lansoprazole (PREVACID) 30 MG capsule Take 30 mg by mouth daily.    Historical Provider, MD  telmisartan (MICARDIS) 40 MG tablet Take 40 mg by mouth daily.     Historical Provider, MD    Family History    Family History  Problem Relation Age of Onset  . CAD Father   . Hypertension Father   . Alzheimer's disease Mother   .  Hypertension Paternal Grandfather   . Hypertension Brother     Social History    Social History   Social History  . Marital Status: Married    Spouse Name: N/A  . Number of Children: N/A  . Years of Education: N/A   Occupational History  . Not on file.   Social History Main Topics  . Smoking status: Never Smoker   . Smokeless tobacco: Not on file     Comment: no smokers in  home  . Alcohol Use: 0.0 oz/week    0 Standard drinks or equivalent per week     Comment: occasional drink every one or two weeks  . Drug Use: No  . Sexual Activity: Not on file   Other Topics Concern  . Not on file   Social History Narrative   Lives in [place] with wife.  Does not routinely exercise.  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     Review of Systems    General:  No chills, fever, night sweats or weight changes.  Cardiovascular:  +++ chest pain, +++ dyspnea.  No  edema, orthopnea, palpitations, paroxysmal nocturnal dyspnea. Dermatological: No rash, lesions/masses Respiratory: No cough, dyspnea Urologic: No hematuria, dysuria Abdominal:   No nausea, vomiting, diarrhea, bright red blood per rectum, melena, or hematemesis Neurologic:  No visual changes, wkns, changes in mental status. All other systems reviewed and are otherwise negative except as noted above.  Physical Exam    Blood pressure 138/61, pulse 71, temperature 97.4 F (36.3 C), temperature source Oral, resp. rate 17, height 6' (1.829 m), weight 300 lb (136.079 kg), SpO2 96 %.  General: Pleasant, NAD Psych: Normal affect. Neuro: Alert and oriented X 3. Moves all extremities spontaneously. HEENT: Normal  Neck: Supple, obese, without bruits.  Difficult to gauge jvp 2/2 girth. Lungs:  Resp regular and unlabored, CTA. Heart: RRR no s3, s4, or murmurs. Abdomen: Soft, non-tender, non-distended, BS + x 4.  Extremities: No clubbing, cyanosis or edema. DP/PT/Radials 2+ and equal bilaterally.  Labs     Recent Labs  04/10/16 1510  TROPONINI <0.03   Lab Results  Component Value Date   WBC 15.0* 04/10/2016   HGB 15.3 04/10/2016   HCT 45.4 04/10/2016   MCV 88.1 04/10/2016   PLT 202 04/10/2016    Recent Labs Lab 04/10/16 1510  NA 141  K 3.9  CL 111  CO2 21*  BUN 20  CREATININE 1.20  CALCIUM 9.5  GLUCOSE 151*    Radiology Studies    No results found.  ECG & Cardiac Imaging    RSR, 91, marked anterolateral ST elevation with inferior reciprocal changes.  Assessment & Plan    1.  Acute Anterolateral STEMI/VF Arrest:  Pt presented to the ED today with chest pain and later developed VF requiring multiple defibrillations, epi, and amio.  Post-arrest ECG notable for marked anterolateral ST elevation and inferior reciprocal changes.  Emergent cath ongoing.  Plan to admit to ICU, cycle trop, check echo, initiate ASA, high potency statin, and  blocker.  Cont home ARB.  Eventual  cardiac rehab.  2.  Essential HTN:  BP currently stable.  On ARB @ home.  Follow.  3.  HL:  Lipid from 03/2015: TC 196, TG 134, HDL 37, LDL 132.  Statin naive.  Adding high potency statin in setting of STEMI.  4.  Diet controlled DM:  Glucose (non-fasting) 151 in ED.  Add SSI.  5.  OSA:  Cont CPAP while inpt.  6.  Morbid Obesity:  Will require dietary counseling/Cardiac rehab.  7.  Leukocytosis:  Likely reactive in setting of ACS.  Follow-up CXR given VF arrest/risk for aspiration.  Signed, Murray Hodgkins, NP 04/10/2016, 4:13 PM

## 2016-04-10 NOTE — Progress Notes (Signed)
eLink Physician-Brief Progress Note Patient Name: Bobby Tanner DOB: 1951/11/08 MRN: [id number]   Date of Service  04/10/2016  HPI/Events of Note  New patient evaluation.  eICU Interventions  Nothing to add.     Intervention Category Intermediate Interventions: Other:  Shaguana Love 04/10/2016, 6:34 PM

## 2016-04-10 NOTE — ED Notes (Signed)
Patient states no relief from nitro. Still c/o of pain 7/10. Will ask MD about pain medication.

## 2016-04-11 ENCOUNTER — Encounter: Payer: Self-pay | Admitting: Cardiovascular Disease

## 2016-04-11 ENCOUNTER — Inpatient Hospital Stay: Payer: BLUE CROSS/BLUE SHIELD

## 2016-04-11 DIAGNOSIS — I469 Cardiac arrest, cause unspecified: Secondary | ICD-10-CM

## 2016-04-11 DIAGNOSIS — I4901 Ventricular fibrillation: Secondary | ICD-10-CM

## 2016-04-11 DIAGNOSIS — I2102 ST elevation (STEMI) myocardial infarction involving left anterior descending coronary artery: Secondary | ICD-10-CM | POA: Diagnosis not present

## 2016-04-11 DIAGNOSIS — I209 Angina pectoris, unspecified: Secondary | ICD-10-CM

## 2016-04-11 DIAGNOSIS — R079 Chest pain, unspecified: Secondary | ICD-10-CM

## 2016-04-11 DIAGNOSIS — I10 Essential (primary) hypertension: Secondary | ICD-10-CM | POA: Diagnosis not present

## 2016-04-11 LAB — COMPREHENSIVE METABOLIC PANEL
ALBUMIN: 3.7 g/dL (ref 3.5–5.0)
ALT: 215 U/L — AB (ref 17–63)
AST: 162 U/L — AB (ref 15–41)
Alkaline Phosphatase: 89 U/L (ref 38–126)
Anion gap: 7 (ref 5–15)
BILIRUBIN TOTAL: 1 mg/dL (ref 0.3–1.2)
BUN: 20 mg/dL (ref 6–20)
CHLORIDE: 109 mmol/L (ref 101–111)
CO2: 21 mmol/L — ABNORMAL LOW (ref 22–32)
CREATININE: 1.25 mg/dL — AB (ref 0.61–1.24)
Calcium: 8.4 mg/dL — ABNORMAL LOW (ref 8.9–10.3)
GFR calc Af Amer: >60 mL/min (ref >60)
GFR, EST NON AFRICAN AMERICAN: 59 mL/min — AB (ref >60)
GLUCOSE: 158 mg/dL — AB (ref 65–99)
POTASSIUM: 4.2 mmol/L (ref 3.5–5.1)
Sodium: 137 mmol/L (ref 135–145)
TOTAL PROTEIN: 6.6 g/dL (ref 6.5–8.1)

## 2016-04-11 LAB — LIPID PANEL
Cholesterol: 189 mg/dL (ref 0–200)
HDL: 37 mg/dL — ABNORMAL LOW
LDL Cholesterol: 132 mg/dL — ABNORMAL HIGH (ref 0–99)
Total CHOL/HDL Ratio: 5.1 ratio
Triglycerides: 100 mg/dL
VLDL: 20 mg/dL (ref 0–40)

## 2016-04-11 LAB — HEMOGLOBIN A1C: Hgb A1c MFr Bld: 6.3 % — ABNORMAL HIGH (ref 4.0–6.0)

## 2016-04-11 LAB — CBC
HEMATOCRIT: 41.1 % (ref 40.0–52.0)
HEMOGLOBIN: 13.8 g/dL (ref 13.0–18.0)
MCH: 30.2 pg (ref 26.0–34.0)
MCHC: 33.6 g/dL (ref 32.0–36.0)
MCV: 90.1 fL (ref 80.0–100.0)
Platelets: 171 10*3/uL (ref 150–440)
RBC: 4.56 MIL/uL (ref 4.40–5.90)
RDW: 13.5 % (ref 11.5–14.5)
WBC: 14.7 10*3/uL — ABNORMAL HIGH (ref 3.8–10.6)

## 2016-04-11 LAB — GLUCOSE, CAPILLARY
GLUCOSE-CAPILLARY: 147 mg/dL — AB (ref 65–99)
Glucose-Capillary: 146 mg/dL — ABNORMAL HIGH (ref 65–99)
Glucose-Capillary: 119 mg/dL — ABNORMAL HIGH (ref 65–99)
Glucose-Capillary: 133 mg/dL — ABNORMAL HIGH (ref 65–99)

## 2016-04-11 LAB — TROPONIN I
TROPONIN I: 3.53 ng/mL — AB (ref <0.031)
Troponin I: 5.24 ng/mL — ABNORMAL HIGH (ref <0.031)

## 2016-04-11 LAB — ECHOCARDIOGRAM COMPLETE
HEIGHTINCHES: 72 in
WEIGHTICAEL: 5040.6 [oz_av]

## 2016-04-11 MED ORDER — ONDANSETRON HCL 4 MG/2ML IJ SOLN
INTRAMUSCULAR | Status: DC | PRN
  Administered 2016-04-10: 4 mg via INTRAVENOUS

## 2016-04-11 MED ORDER — TIROFIBAN (AGGRASTAT) BOLUS VIA INFUSION
INTRAVENOUS | Status: DC | PRN
  Administered 2016-04-10: 3635 ug via INTRAVENOUS

## 2016-04-11 MED ORDER — TIROFIBAN HCL IN NACL 5-0.9 MG/100ML-% IV SOLN
INTRAVENOUS | Status: DC | PRN
  Administered 2016-04-10: 0.15 ug/kg/min via INTRAVENOUS

## 2016-04-11 MED ORDER — PNEUMOCOCCAL VAC POLYVALENT 25 MCG/0.5ML IJ INJ: 0.5000 mL | INJECTION | INTRAMUSCULAR | Status: DC

## 2016-04-11 MED FILL — Medication: Qty: 1 | Status: AC

## 2016-04-11 NOTE — Progress Notes (Signed)
65 yo wm transferred from CCU to 236 via bed.  Alert and oriented x4, no distress on ra.  Cardiac monitor applied and verified with Gerald Stabs, CNA.  Pt denies chest pain.  Lungs diminished lower lobes bil.  1+ edema noted to lower extremities bil.  Dressing dry and intact to rt radial cath site.  SL rt ac/rt hand in place.  Oriented to room and surroundings, POC reviewed with pt.  Denies need at this time, family at bedside.

## 2016-04-11 NOTE — Progress Notes (Signed)
Ambulated around nurses station x 2 with minimal shortness of breath.

## 2016-04-11 NOTE — Progress Notes (Signed)
Pt is to be transferred to room 236, report called to [place], rn receiving pt.  Pt is alert and oriented, no complaints of pain. NSR, lungs clear on room air.  Up in room and chair with no complications, plan is to ambulate pt in halls today and observe for arrthymias. Voids.  VSS, afebrile.  Right radial site clear of hematoma or bleeding, positive pulses.

## 2016-04-11 NOTE — Progress Notes (Signed)
Pt voided around 0650 this am (341ml) with about 1L urine output on night shift.  Pt has not voided since 0650.  Pt reports that he has not been drinking much.  Now getting pt to drink fluids. Pt states he "will be able to pee after he drinks the fluids and walks around."  Called and spoke with Lavella Lemons of pt urine status and to monitor for urine output.

## 2016-04-11 NOTE — Progress Notes (Signed)
Patient: Bobby Tanner / Admit Date: 04/10/2016 / Date of Encounter: 04/11/2016, 7:28 AM   Subjective: Patient presented to [facility] ED on 5/15 with acute onset of substernal chest pain with initial EKG being non-acute, subsequently developing ventricular fibrillation and was unresponsive required multiple defibs. Repeat EKG showed marked anterior ST elevation with reciprocal inferior st changes. Code STEMI was called. He again had recurrent VT requiring at least 2 more defibs leading to restoration of sinus rhyhtm. He was taken emergently to the cardiac cath lab which showed the proximal LAD was 99% stenosed s/p PCI/DES there was 0% residual stenosis. LVSF was normal. Otherwise there was no obstructive disease. He had significant N/V s/p cath and was continued on tirofiban. Overnight, he has done well. No further chest pain. Felt good getting up in his room. Some SOB this AM. O2 saturations stable.   Review of Systems: Review of Systems  Constitutional: Negative for fever, chills, weight loss, malaise/fatigue and diaphoresis.  HENT: Negative for congestion.   Eyes: Negative for discharge and redness.  Respiratory: Positive for shortness of breath. Negative for cough, hemoptysis, sputum production and wheezing.   Cardiovascular: Negative for chest pain, palpitations, orthopnea, claudication, leg swelling and PND.  Gastrointestinal: Negative for heartburn, nausea, vomiting and abdominal pain.  Musculoskeletal: Negative for falls.  Skin: Negative for rash.  Neurological: Negative for dizziness, tingling, tremors, sensory change, speech change, focal weakness, loss of consciousness and weakness.  Endo/Heme/Allergies: Does not bruise/bleed easily.  Psychiatric/Behavioral: The patient is not nervous/anxious.      Objective: Telemetry: NSR, 60's, no further increased ventricular ectopy Physical Exam: Blood pressure 121/68, pulse 66, temperature 98.8 F (37.1 C), temperature source Oral, resp. rate  16, height 6' (1.829 m), weight 320 lb 8.8 oz (145.4 kg), SpO2 98 %. Body mass index is 43.46 kg/(m^2). General: Well developed, well nourished, in no acute distress. Head: Normocephalic, atraumatic, sclera non-icteric, no xanthomas, nares are without discharge. Neck: Negative for carotid bruits. JVP not elevated. Lungs: Clear bilaterally to auscultation without wheezes, rales, or rhonchi. Breathing is unlabored. Heart: RRR S1 S2 without murmurs, rubs, or gallops.  Abdomen: Obese, soft, non-tender, non-distended with normoactive bowel sounds. No rebound/guarding. Extremities: No clubbing or cyanosis. No edema. Distal pedal pulses are 2+ and equal bilaterally. Cath site healing well, without bleeding, bruising, swelling, erythema, or TTP. Distal pulse 2+.  Neuro: Alert and oriented X 3. Moves all extremities spontaneously. Psych:  Responds to questions appropriately with a normal affect.   Intake/Output Summary (Last 24 hours) at 04/11/16 0728 Last data filed at 04/11/16 0700  Gross per 24 hour  Intake    500 ml  Output   1080 ml  Net   -580 ml    Inpatient Medications:  . aspirin EC  81 mg Oral Daily  . atorvastatin  80 mg Oral q1800  . carvedilol  3.125 mg Oral BID WC  . fluticasone  2 spray Each Nare Daily  . heparin  5,000 Units Subcutaneous Q8H  . insulin aspart  0-15 Units Subcutaneous TID WC  . irbesartan  150 mg Oral Daily  . pantoprazole  40 mg Oral Q0600  . sodium chloride flush  3 mL Intravenous Q12H  . sodium chloride flush  3 mL Intravenous Q12H  . ticagrelor  90 mg Oral BID   Infusions:    Labs:  Recent Labs  04/10/16 1510 04/10/16 1925  NA 141  --   K 3.9  --   CL 111  --  CO2 21*  --   GLUCOSE 151*  --   BUN 20  --   CREATININE 1.20 1.48*  CALCIUM 9.5  --    No results for input(s): AST, ALT, ALKPHOS, BILITOT, PROT, ALBUMIN in the last 72 hours.  Recent Labs  04/10/16 1925 04/11/16 0648  WBC 19.5* 14.7*  HGB 14.7 13.8  HCT 44.1 41.1  MCV 91.2  90.1  PLT 193 171    Recent Labs  04/10/16 1510 04/10/16 1925 04/11/16 0036 04/11/16 0648  TROPONINI <0.03 0.66* 3.53* 5.24*   Invalid input(s): POCBNP No results for input(s): HGBA1C in the last 72 hours.   Weights: Filed Weights   04/10/16 1514 04/10/16 1745 04/11/16 0500  Weight: 300 lb (136.079 kg) 315 lb 0.6 oz (142.9 kg) 320 lb 8.8 oz (145.4 kg)     Radiology/Studies:  No results found.   Assessment and Plan   1. Acute anterior ST elevation MI s/p PCI as above/VF arrest: -No further chest pain -Continue DAPT with aspirin 81 mg and Brilinta 90 mg bid for at least the next 12 months -Coreg -Echo is pending  -Cardiac rehab -Some SOB, could be related to his Brilinta vs prior history of SOB with statin. O2 saturations stable. Continue to monitor -If ok with Dr. Rockey Situ, MD would transfer to telemetry and have patient ambulate throughout the hallways today  2. HTN: -Well controlled -Continue Coreg and irbesartan   3. HLD: -Lipitor 80 mg daily -Has history of statin intolerance, though he is not sure which statin it was that lead to prior SOB -Goal LDL <70, check lipids  4. Diet controlled DM: -SSI per IM -Check A1C  5. OSA: -CPAP  6. Morbid obesity: -Needs dietary counseling -Cardiac rehab  7. Leukocytosis: -Likely in the setting of #1 -Will repeat CXR today to evaluate for aspiration PNA given VF arrest   Signed, Christell Faith, PA-C Pager: [phone] 04/11/2016, 7:28 AM

## 2016-04-12 ENCOUNTER — Telehealth: Payer: Self-pay

## 2016-04-12 ENCOUNTER — Telehealth: Payer: Self-pay | Admitting: Cardiovascular Disease

## 2016-04-12 DIAGNOSIS — E119 Type 2 diabetes mellitus without complications: Secondary | ICD-10-CM

## 2016-04-12 DIAGNOSIS — G4733 Obstructive sleep apnea (adult) (pediatric): Secondary | ICD-10-CM

## 2016-04-12 MED ORDER — ATORVASTATIN CALCIUM 80 MG PO TABS: 80.0000 mg | ORAL_TABLET | Freq: Every day | ORAL | Status: DC

## 2016-04-12 MED ORDER — TICAGRELOR 90 MG PO TABS: 90.0000 mg | ORAL_TABLET | Freq: Two times a day (BID) | ORAL | Status: DC

## 2016-04-12 MED ORDER — CARVEDILOL 3.125 MG PO TABS: 3.1250 mg | ORAL_TABLET | Freq: Two times a day (BID) | ORAL | Status: DC

## 2016-04-12 MED ORDER — NITROGLYCERIN 0.4 MG SL SUBL: 0.4000 mg | SUBLINGUAL_TABLET | SUBLINGUAL | Status: DC | PRN

## 2016-04-12 NOTE — Telephone Encounter (Signed)
Spoke w/ pt's wife.  Clarified d/c instructions read to "Continue Coreg and irbesartan " She verbalizes understanding and is appreciative of the call.

## 2016-04-12 NOTE — Telephone Encounter (Signed)
Attempted to contact pt regarding discharge from [facility] on 04/12/16. Left message asking pt to call back regarding discharge instructions and/or medications. Advised pt of appt w/ Dr. Rockey Situ on 04/18/16 at 11:20 w/ [facility]. Asked pt to call back if unable to keep this appt.

## 2016-04-12 NOTE — Progress Notes (Signed)
No voiced complaints of chest pain or shortness of breath.  CPAP in place while asleep.

## 2016-04-12 NOTE — Care Management (Signed)
CM consult for [place] assistance. Coupon given by cardiology. Patient is a Pharmacist, hospital. He is independent, active and drives. No home health needs anticipated. Case closed.

## 2016-04-12 NOTE — Telephone Encounter (Signed)
-----   Message from Blain Pais sent at 04/12/2016  8:33 AM EDT ----- Regarding: tcm/ph 5/23 11:20 Dr. Rockey Situ

## 2016-04-12 NOTE — Discharge Summary (Signed)
Discharge Summary    Patient ID: Bobby Tanner,  MRN: [id number], DOB/AGE: Mar 28, 1951 65 y.o.  Admit date: 04/10/2016 Discharge date: 04/12/2016  Primary Care Provider: Rusty Aus Primary Cardiologist: Dr. Fletcher Anon, MD  Discharge Diagnoses    Active Problems:   ST elevation myocardial infarction involving left anterior descending (LAD) coronary artery [facility])   Cardiac arrest [facility])   Ventricular fibrillation ([place])   Morbid obesity ([place])   Hyperlipidemia   Diet-controlled diabetes mellitus ([place])   Essential hypertension   Obstructive sleep apnea   Chest pain   Allergies No Known Allergies  Diagnostic Studies/Procedures    Cardiac cath 04/10/2016: Coronary Findings    Dominance: Right   Left Main  . Vessel is angiographically normal.     Left Anterior Descending   . Prox LAD lesion, 99% stenosed. Thrombotic with heavy thrombus. The lesion was not previously treated. Pressure wire/FFR was not performed on the lesion   . Thrombectomy: An aspiration thrombectomy was performed on the lesion. Thrombectomy was successful.  Marland Kitchen PCI: The pre-interventional distal flow is normal (TIMI 3). No pre-stent angioplasty was performed. A drug-eluting stent was placed. No post-stent angioplasty was performed. Maximum pressure: 12 atm. The post-interventional distal flow is normal (TIMI 3). No complications occurred at this lesion.  . There is no residual stenosis post intervention.     . First Diagonal Branch   . 1st Diag lesion, 50% stenosed.   . Second Diagonal Branch   The vessel is angiographically normal.   . Third Diagonal Branch   The vessel is small in size and is angiographically normal.     Left Circumflex  The vessel exhibits minimal luminal irregularities.   . First Obtuse Marginal Branch   The vessel is small in size and is angiographically normal.   . Second Obtuse Marginal Branch   The vessel is angiographically normal.   . Third Obtuse Marginal Branch   The  vessel is angiographically normal.     Right Coronary Artery  The vessel exhibits minimal luminal irregularities.   . Right Posterior Descending Artery   The vessel is angiographically normal.   . Right Posterior Atrioventricular Branch   The vessel is angiographically normal.   . First Right Posterolateral   The vessel is angiographically normal.   . Second Right Posterolateral   The vessel is angiographically normal.   . Third Right Posterolateral   The vessel is angiographically normal.      Wall Motion                 Left Heart    Left Ventricle The left ventricular size is normal. The left ventricular systolic function is normal. The left ventricular ejection fraction is 45-50% by visual estimate. There are wall motion abnormalities in the left ventricle. There are segmental wall motion abnormalities in the left ventricle.   Mitral Valve There is no mitral valve regurgitation.   Aortic Valve There is no aortic valve stenosis.    Coronary Diagrams    Diagnostic Diagram           Post-Intervention Diagram         Echocardiogram 04/10/2016: Study Conclusions - Left ventricle: The cavity size was normal. Systolic function was  normal. The estimated ejection fraction was in the range of 50%  to 55%. Akinesis of the apical myocardium, hypokinesis of the  anteroapical region. Left ventricular diastolic function  parameters were normal. - Mitral valve: There was mild regurgitation. - Right  ventricle: Systolic function was normal. - Pulmonary arteries: Systolic pressure was within the normal  range. _____________   History of Present Illness     65 year old male with newly diagnosed CAD s/p anterior wall ST elevation MI in the setting of multiple VT arrests in the [facility] ED who was without prior known cardiac history. Also with history of HTN, HLD, morbid obesity, OSA, and diet controlled DM who presented at [facility] ED on 04/10/16 with acute onset of  chest pain. He reported a prior history of chest pain approximately 3 years ago with a negative cardiac work up at that time. No data is available for review during this admission. He was doing some gardening on 5/15 when he began to experience sudden onset of chest pain. Symptoms persisted for 2 hours prior to his arrival to [facility] ED for further evaluation.   Hospital Course     Consultants: Case manager & cardiac rehab  His initial EKG was non-acute and troponin was normal. At 1535, he became unresponsive and was noted to be in VT. CPR/ACLS was initiated. He required multiple defibrillations and was given amiodarone 150 mg, epi 1 mg, and heparin 4,000 units. He had ROSC and was noted to have marked anterior ST elevation on 1549 EKG with inferior reciprocal changes. Code STEMI was called. The patient had recurrent VT requiring at least 2 more defibrillations, with the last being successful in restoring normal sinus rhythm. Again, anterior ST elevation was noted on EKG. At that point he was taken emergently to the cardiac cath lab which showed severe 1-vessel CAD with heavy thrombotic lesion in the proximal LAD, otherwise no obstructive disease. He underwent successful aspiration thrombectomy and PCI/DES to the proximal LAD. His LVSF was mildly reduced with mild anterior wall HK on LV gram. There was moderately to severely elevated LVEDP. Post procedure he had persistent nausea and vomiting. There was concern as to if he absorbed the Brilinta that was given to him. Because of this he was placed on Aggrostat for 12 hours post cath. Post cardiac cath showed NSR, 61 bpm, improvement in anterior ST elevation with anterolateral TWI. Echo showed an EF of 50-55%, akinesis of the apical myocardium, hypokinesis of the anteroapical region, LV diastolic function was normal, mild MR, RV systolic function was normal, PASP was normal. Troponin peaked at 5.24 post cardiac catheterization, patient was asymptomatic. His  presenting WBC count was 19.5-->14.7 on 5/16. Because of the VT arrests and his leukocytosis follow up CXR was performed on 5/16 to rule out possible aspiration PNA. CXR on 5/16 was negative. LDL was 132, TC 189, HDL 37, TG 100, A1C 6.3%. He has reported history of statin intolerance, though he is uncertain as to which statin he has previously been on. He also noted mild SOB on 5/16, though had stable pulse ox in the upper 90's on room air. He did note some chest soreness 2/2 chest compressions the day prior. He was transferred to telemetry on 5/16 and ambulated without issues. He had no further chest pain. SOB was greatly improved on 04/12/16. He has been tolerating his medications without issues. No further ventricular ectopy seen on telemetry. He is to follow up with his PCP regarding his diabetes. He is ready to go home.   The patient has been seen by Dr. Rockey Situ, MD and felt to be stable for discharge.  _____________  Discharge Vitals Blood pressure 114/63, pulse 61, temperature 97.6 F (36.4 C), temperature source Oral, resp. rate 20, height  6' (1.829 m), weight 313 lb 3.2 oz (142.067 kg), SpO2 95 %.  Filed Weights   04/10/16 1745 04/11/16 0500 04/12/16 0423  Weight: 315 lb 0.6 oz (142.9 kg) 320 lb 8.8 oz (145.4 kg) 313 lb 3.2 oz (142.067 kg)    Labs & Radiologic Studies    CBC  Recent Labs  04/10/16 1925 04/11/16 0648  WBC 19.5* 14.7*  HGB 14.7 13.8  HCT 44.1 41.1  MCV 91.2 90.1  PLT 193 403   Basic Metabolic Panel  Recent Labs  04/10/16 1510 04/10/16 1925 04/11/16 0648  NA 141  --  137  K 3.9  --  4.2  CL 111  --  109  CO2 21*  --  21*  GLUCOSE 151*  --  158*  BUN 20  --  20  CREATININE 1.20 1.48* 1.25*  CALCIUM 9.5  --  8.4*   Liver Function Tests  Recent Labs  04/11/16 0648  AST 162*  ALT 215*  ALKPHOS 89  BILITOT 1.0  PROT 6.6  ALBUMIN 3.7   No results for input(s): LIPASE, AMYLASE in the last 72 hours. Cardiac Enzymes  Recent Labs  04/10/16 1925  04/11/16 0036 04/11/16 0648  TROPONINI 0.66* 3.53* 5.24*   BNP Invalid input(s): POCBNP D-Dimer No results for input(s): DDIMER in the last 72 hours. Hemoglobin A1C  Recent Labs  04/11/16 0648  HGBA1C 6.3*   Fasting Lipid Panel  Recent Labs  04/11/16 0648  CHOL 189  HDL 37*  LDLCALC 132*  TRIG 100  CHOLHDL 5.1   Thyroid Function Tests  Recent Labs  04/10/16 1925  TSH 2.357   _____________  Dg Chest Port 1 View  04/11/2016  CLINICAL DATA:  Post cardiac arrest EXAM: PORTABLE CHEST 1 VIEW COMPARISON:  None. FINDINGS: Normal heart size. Normal mediastinal contour. No pneumothorax. No pleural effusion. Lungs appear clear, with no acute consolidative airspace disease and no pulmonary edema. IMPRESSION: No active disease. Electronically Signed   By: Ilona Sorrel M.D.   On: 04/11/2016 08:39   Disposition   Pt is being discharged home today in good condition.  Follow-up Plans & Appointments    Follow-up Information    Follow up with Ida Rogue, MD On 04/18/2016.   Specialty:  Cardiology   Why:  Appointment time: 11:20 AM   Contact information:   [address] [place] [address] [phone]      Discharge Instructions    AMB Referral to Cardiac Rehabilitation - Phase II    Complete by:  As directed   Diagnosis:   PTCA STEMI             Discharge Medications   Current Discharge Medication List    START taking these medications   Details  atorvastatin (LIPITOR) 80 MG tablet Take 1 tablet (80 mg total) by mouth daily at 6 PM. Qty: 30 tablet, Refills: 11    carvedilol (COREG) 3.125 MG tablet Take 1 tablet (3.125 mg total) by mouth 2 (two) times daily with a meal. Qty: 60 tablet, Refills: 11    nitroGLYCERIN (NITROSTAT) 0.4 MG SL tablet Place 1 tablet (0.4 mg total) under the tongue every 5 (five) minutes x 3 doses as needed for chest pain. Qty: 25 tablet, Refills: 12    !! ticagrelor (BRILINTA) 90 MG TABS tablet Take 1 tablet (90  mg total) by mouth 2 (two) times daily. Qty: 60 tablet, Refills: 0    !! ticagrelor (BRILINTA) 90 MG TABS tablet  Take 1 tablet (90 mg total) by mouth 2 (two) times daily. Qty: 60 tablet, Refills: 11     !! - Potential duplicate medications found. Please discuss with provider.    CONTINUE these medications which have NOT CHANGED   Details  Difluprednate 0.05 % EMUL Apply 1 drop to eye 2 (two) times daily.    moxifloxacin (VIGAMOX) 0.5 % ophthalmic solution Place 1 drop into the right eye 4 (four) times daily. Reported on 04/11/2016    nepafenac (NEVANAC) 0.1 % ophthalmic suspension Place 1 drop into the right eye at bedtime.    aspirin EC 81 MG tablet Take 81 mg by mouth daily.    fluticasone (FLONASE) 50 MCG/ACT nasal spray Place 2 sprays into both nostrils daily.    lansoprazole (PREVACID) 30 MG capsule Take 30 mg by mouth daily.    telmisartan (MICARDIS) 40 MG tablet Take 40 mg by mouth daily.          Aspirin prescribed at discharge?  Yes High Intensity Statin Prescribed? (Lipitor 40-88m or Crestor 20-47m: Yes Beta Blocker Prescribed? Yes For EF <40%, was ACEI/ARB Prescribed? No: EF >40% ADP Receptor Inhibitor Prescribed? (i.e. Plavix etc.-Includes Medically Managed Patients): Yes For EF <40%, Aldosterone Inhibitor Prescribed? No: EF >40% Was EF assessed during THIS hospitalization? Yes Was Cardiac Rehab II ordered? (Included Medically managed Patients): Yes   Outstanding Labs/Studies   none  Duration of Discharge Encounter   Greater than 30 minutes including physician time.  Signed, RyChristell FaithA-C 04/12/2016, 8:32 AM

## 2016-04-12 NOTE — Telephone Encounter (Signed)
Patient wife wants to clarify medication changes.  Patient had mi .  Dr. Fletcher Anon did a cath at [place] 3.125 po x2 daily was added   Patient was taking telmisartan 40 mg tablet po once daily  She wants to make sure she has instructions correct.   Was he supposed to stop taking the telmisartan when coreg was started? Please call to clarify.

## 2016-04-12 NOTE — Progress Notes (Signed)
Patient: Bobby Tanner / Admit Date: 04/10/2016 / Date of Encounter: 04/12/2016, 7:21 AM   Subjective: No further chest pain. SOB much improved. Has ambulated in the hallways since coming to telemetry without any issues. Tolerating medications without issues. Echo showed EF 50-55%, akinesis of the apical myocardium, hypokinesis of the anteroseptal myocardium, LV diastolic function was normal, mild MR, RV systolic function was normal, PASP was normal.   Review of Systems: Review of Systems  Constitutional: Negative for fever, chills, weight loss, malaise/fatigue and diaphoresis.  HENT: Negative for congestion.   Eyes: Negative for discharge and redness.  Respiratory: Negative for cough, hemoptysis, sputum production, shortness of breath and wheezing.   Cardiovascular: Negative for chest pain, palpitations, orthopnea, claudication, leg swelling and PND.  Gastrointestinal: Negative for heartburn, nausea, vomiting, abdominal pain, blood in stool and melena.  Genitourinary: Negative for hematuria.  Musculoskeletal: Negative for myalgias and falls.  Skin: Negative for rash.  Neurological: Negative for dizziness, tingling, tremors, sensory change, speech change, focal weakness, loss of consciousness and weakness.  Endo/Heme/Allergies: Does not bruise/bleed easily.  Psychiatric/Behavioral: Negative for substance abuse. The patient is not nervous/anxious.   All other systems reviewed and are negative.   Objective: Telemetry: NSR, 60's bpm Physical Exam: Blood pressure 114/63, pulse 61, temperature 97.6 F (36.4 C), temperature source Oral, resp. rate 20, height 6' (1.829 m), weight 313 lb 3.2 oz (142.067 kg), SpO2 95 %. Body mass index is 42.47 kg/(m^2). General: Well developed, well nourished, in no acute distress. Head: Normocephalic, atraumatic, sclera non-icteric, no xanthomas, nares are without discharge. Neck: Negative for carotid bruits. JVP not elevated. Lungs: Clear bilaterally to  auscultation without wheezes, rales, or rhonchi. Breathing is unlabored. Heart: RRR S1 S2 without murmurs, rubs, or gallops.  Abdomen: Obese, soft, non-tender, non-distended with normoactive bowel sounds. No rebound/guarding. Extremities: No clubbing or cyanosis. No edema. Distal pedal pulses are 2+ and equal bilaterally. Right radial cath site is well healed without bleeding, bruising, swelling, erythema, or TTP. Distal pulse 2+.  Neuro: Alert and oriented X 3. Moves all extremities spontaneously. Psych:  Responds to questions appropriately with a normal affect.   Intake/Output Summary (Last 24 hours) at 04/12/16 0721 Last data filed at 04/11/16 2223  Gross per 24 hour  Intake    240 ml  Output    300 ml  Net    -60 ml    Inpatient Medications:  . aspirin EC  81 mg Oral Daily  . atorvastatin  80 mg Oral q1800  . carvedilol  3.125 mg Oral BID WC  . fluticasone  2 spray Each Nare Daily  . heparin  5,000 Units Subcutaneous Q8H  . insulin aspart  0-15 Units Subcutaneous TID WC  . irbesartan  150 mg Oral Daily  . pantoprazole  40 mg Oral Q0600  . pneumococcal 23 valent vaccine  0.5 mL Intramuscular Tomorrow-1000  . sodium chloride flush  3 mL Intravenous Q12H  . sodium chloride flush  3 mL Intravenous Q12H  . ticagrelor  90 mg Oral BID   Infusions:    Labs:  Recent Labs  04/10/16 1510 04/10/16 1925 04/11/16 0648  NA 141  --  137  K 3.9  --  4.2  CL 111  --  109  CO2 21*  --  21*  GLUCOSE 151*  --  158*  BUN 20  --  20  CREATININE 1.20 1.48* 1.25*  CALCIUM 9.5  --  8.4*    Recent Labs  04/11/16  0648  AST 162*  ALT 215*  ALKPHOS 89  BILITOT 1.0  PROT 6.6  ALBUMIN 3.7    Recent Labs  04/10/16 1925 04/11/16 0648  WBC 19.5* 14.7*  HGB 14.7 13.8  HCT 44.1 41.1  MCV 91.2 90.1  PLT 193 171    Recent Labs  04/10/16 1510 04/10/16 1925 04/11/16 0036 04/11/16 0648  TROPONINI <0.03 0.66* 3.53* 5.24*   Invalid input(s): POCBNP  Recent Labs   04/11/16 0648  HGBA1C 6.3*     Weights: Filed Weights   04/10/16 1745 04/11/16 0500 04/12/16 0423  Weight: 315 lb 0.6 oz (142.9 kg) 320 lb 8.8 oz (145.4 kg) 313 lb 3.2 oz (142.067 kg)     Radiology/Studies:  Dg Chest Port 1 View  04/11/2016  CLINICAL DATA:  Post cardiac arrest EXAM: PORTABLE CHEST 1 VIEW COMPARISON:  None. FINDINGS: Normal heart size. Normal mediastinal contour. No pneumothorax. No pleural effusion. Lungs appear clear, with no acute consolidative airspace disease and no pulmonary edema. IMPRESSION: No active disease. Electronically Signed   By: Ilona Sorrel M.D.   On: 04/11/2016 08:39     Assessment and Plan   1. Acute anterior ST elevation MI s/p PCI as above/VF arrest: -No further chest pain -Continue DAPT with aspirin 81 mg and Brilinta 90 mg bid for at least the next 12 months -Coreg 3.125 mg bid, HR precludes further titration at this time -Echo as above  -Cardiac rehab -SOB is much improved, though some of his SOB, could be related to his Brilinta vs prior history of SOB with statin. O2 saturations stable. Continue to monitor -Once seen by Dr. Rockey Situ, MD would likely be ok for discharge today  2. HTN: -Well controlled -Continue Coreg and irbesartan   3. HLD: -Lipitor 80 mg daily -Has history of statin intolerance, though he is not sure which statin it was that lead to prior SOB -Tolerating well without issues at this time -Goal LDL <70, check lipids  4. Diet controlled DM: -SSI per IM -A1C 6.4% -Needs follow up with PCP  5. OSA: -CPAP  6. Morbid obesity: -Needs dietary counseling -Cardiac rehab  7. Leukocytosis: -Likely in the setting of #1 -Repeat CXR on 5/16 negative  8. Dispo: -Discharge today if ok with MD with outpatient follow up in 7 days with Dr. Fletcher Anon, MD  Signed, Christell Faith, PA-C Pager: [phone] 04/12/2016, 7:21 AM   Agree with above, Please see rounding note  Signed, Esmond Plants, MD, Ph.D [facility]

## 2016-04-18 ENCOUNTER — Ambulatory Visit (INDEPENDENT_AMBULATORY_CARE_PROVIDER_SITE_OTHER): Payer: BLUE CROSS/BLUE SHIELD | Admitting: Cardiovascular Disease

## 2016-04-18 ENCOUNTER — Telehealth: Payer: Self-pay | Admitting: Cardiovascular Disease

## 2016-04-18 ENCOUNTER — Encounter: Payer: Self-pay | Admitting: Cardiovascular Disease

## 2016-04-18 VITALS — BP 104/64 | HR 62 | Ht 72.0 in | Wt 296.2 lb

## 2016-04-18 DIAGNOSIS — E785 Hyperlipidemia, unspecified: Secondary | ICD-10-CM

## 2016-04-18 DIAGNOSIS — I10 Essential (primary) hypertension: Secondary | ICD-10-CM | POA: Diagnosis not present

## 2016-04-18 DIAGNOSIS — I4901 Ventricular fibrillation: Secondary | ICD-10-CM | POA: Diagnosis not present

## 2016-04-18 DIAGNOSIS — I2102 ST elevation (STEMI) myocardial infarction involving left anterior descending coronary artery: Secondary | ICD-10-CM

## 2016-04-18 DIAGNOSIS — R0602 Shortness of breath: Secondary | ICD-10-CM | POA: Diagnosis not present

## 2016-04-18 DIAGNOSIS — E119 Type 2 diabetes mellitus without complications: Secondary | ICD-10-CM

## 2016-04-18 MED ORDER — POTASSIUM CHLORIDE ER 10 MEQ PO TBCR: 10.0000 meq | EXTENDED_RELEASE_TABLET | Freq: Every day | ORAL | Status: DC | PRN

## 2016-04-18 MED ORDER — FUROSEMIDE 20 MG PO TABS
20.0000 mg | ORAL_TABLET | Freq: Every day | ORAL | Status: DC | PRN
Start: 2016-04-18 — End: 2017-05-15

## 2016-04-18 MED ORDER — TELMISARTAN 20 MG PO TABS: 20.0000 mg | ORAL_TABLET | Freq: Every day | ORAL | Status: DC

## 2016-04-18 NOTE — Telephone Encounter (Signed)
Patient wants to make sure he is ok to proceed with cataract surgery. Patient was seen in office today and forgot to ask Dr. Rockey Situ if he has restrictions on this since he just had MI last week.  Please call patient to let him know if he should wait for this or if it is ok to go ahead and schedule surgery.

## 2016-04-18 NOTE — Assessment & Plan Note (Signed)
Long discussion concerning his diet Recommended low carbohydrates, weight loss

## 2016-04-18 NOTE — Assessment & Plan Note (Signed)
Blood pressure running low on today's visit, recommended he decrease the Benicar down to 20 mgrams daily from 40 mg He is having lightheaded symptoms

## 2016-04-18 NOTE — Assessment & Plan Note (Signed)
Recommended he stay on his Lipitor. Goal LDL less than 70 Recommended recheck cholesterol panel in 3 months time either through primary care or through our office

## 2016-04-18 NOTE — Assessment & Plan Note (Signed)
Ventricular fibrillation the setting of STEMI 04/10/2016 Stent placed at that time, minimally depressed ejection fraction with region of hypokinesis in the anterior, anteroseptal wall, apical region Will recommend repeat echocardiogram in 3 months time

## 2016-04-18 NOTE — Assessment & Plan Note (Signed)
Etiology of his shortness of breath symptoms is unclear High risk of diastolic and systolic CHF We have given him prescription for Lasix with potassium to take as needed Recommended he watch his salt and fluid intake If symptoms do not improve, may need to change his brilinta to Plavix We also sent in a order to start cardiac rehabilitation in 2-3 weeks' time

## 2016-04-18 NOTE — Progress Notes (Signed)
Patient ID: Bobby Tanner, male    DOB: 1950/12/13, 65 y.o.   MRN: [id number]  HPI Comments: 65 year old gentleman with history of Acute anterior ST elevation MI s/p PCI with VF arrest requiring chest compressions 04/10/2016, DES placed to the proximal LAD also with residual 50% diagonal disease discharged on 04/12/2016 with follow-up today for his coronary artery disease, STEMI  He has several issues on today's visit Reports that after he takes his morning and evening medications, he gets tunnel vision, feels like blood pressures dropping Also has periodic shortness of breath seems to get worse after taking his medications He has started doing some low-grade walking, occasionally has some tightness up into his neck Some tenderness in his chest in the day and sometimes when sleeping, relieved by changing position He is interested in participating in cardiac rehabilitation  EKG on today's visit shows normal sinus rhythm with rate 62 bpm, old anterior MI, left axis deviation  Other past medical history reviewed with him Previous  Echo showed EF 50-55%, akinesis of the apical myocardium, hypokinesis of the anteroseptal myocardium, LV diastolic function was normal, mild MR, RV systolic function was normal, PASP was normal.   Cardiac catheterization 04/10/2016: 1st Diag lesion, 50% stenosed. Prox LAD lesion, 99% stenosed. Post intervention, there is a 0% residual stenosis. The lesion was not previously treated.The left ventricular systolic function is normal.  1. Severe one-vessel coronary artery disease with heavy thrombotic lesion in the proximal LAD. Otherwise no obstructive disease. 2. Mildly reduced LV systolic function with mild anterior wall hypokinesis. Moderately to severely elevated left ventricular end-diastolic pressure. 3. Successful aspiration thrombectomy and drug-eluting stent placement to the proximal LAD.     No Known Allergies  Current Outpatient Prescriptions on File  Prior to Visit  Medication Sig Dispense Refill  . aspirin EC 81 MG tablet Take 81 mg by mouth daily.    Marland Kitchen atorvastatin (LIPITOR) 80 MG tablet Take 1 tablet (80 mg total) by mouth daily at 6 PM. 30 tablet 11  . carvedilol (COREG) 3.125 MG tablet Take 1 tablet (3.125 mg total) by mouth 2 (two) times daily with a meal. 60 tablet 11  . Difluprednate 0.05 % EMUL Apply 1 drop to eye 2 (two) times daily.    . fluticasone (FLONASE) 50 MCG/ACT nasal spray Place 2 sprays into both nostrils daily.    . lansoprazole (PREVACID) 30 MG capsule Take 30 mg by mouth daily.    . nepafenac (NEVANAC) 0.1 % ophthalmic suspension Place 1 drop into the right eye at bedtime.    . nitroGLYCERIN (NITROSTAT) 0.4 MG SL tablet Place 1 tablet (0.4 mg total) under the tongue every 5 (five) minutes x 3 doses as needed for chest pain. 25 tablet 12  . ticagrelor (BRILINTA) 90 MG TABS tablet Take 1 tablet (90 mg total) by mouth 2 (two) times daily. 60 tablet 0   No current facility-administered medications on file prior to visit.    Past Medical History  Diagnosis Date  . Obstructive sleep apnea     a. Compliant w/ CPAP.  Marland Kitchen Essential hypertension   . GERD (gastroesophageal reflux disease)   . Diet-controlled diabetes mellitus ([place])   . HOH (hard of hearing)   . Skin cancer     a. Basal cell carcinoma of neck s/p resection.  . Hyperlipidemia   . Anginal pain ([place])     a. Reports nl cardiac w/u ~ 2014.  . Orthopnea     a. Improved with CPAP.  Marland Kitchen  Asthma     a. had as a child. pollen could aggrevate but does not use inhalers  . Arthritis     a. Hands  . Morbid obesity ([place])   . Hemorrhoids   . Cataract     a. 03/2016 s/p R cataract extraction and IOL placement.  Marland Kitchen CAD (coronary artery disease)     a. anterior wall ST elevation MI 0000000 complicated by multiple VT arrests requiring multiple defibs; b. cardiac cath 04/10/16: pLAD 99% s/p aspiration thrombectomy and PCI/DES 0% residual stenosis, D1 50%, mod to sev  elevated LVEDP, mildly reduced LVSF  . ST elevation myocardial infarction (STEMI) of anterior wall ([place]) 04/10/2016  . Ischemic cardiomyopathy     a. echo 04/10/16: EF 50-55%, AK of apical wall, HK of anteroapical wall, LV dias fxn nl, mild MR, RV sys fxn nl, PASP nl  . Cardiac arrest with ventricular fibrillation ([place]) 04/10/2016    a. VT/VF arrest x 2 in setting of anterior ST elevation MI requiring multiple defibs, chest compressions, amio, epi    Past Surgical History  Procedure Laterality Date  . Skin cancers      head  . Colonoscopy  2014  . Cataract extraction w/phaco Right 04/03/2016    Procedure: CATARACT EXTRACTION PHACO AND INTRAOCULAR LENS PLACEMENT (IOC);  Surgeon: Estill Cotta, MD;  Location: [facility];  Service: Ophthalmology;  Laterality: Right;  [place] 01:13 AP% 25.8 CDE 34.18 fluid pack lot [id number]  . Cardiac catheterization N/A 04/10/2016    Procedure: Left Heart Cath and Coronary Angiography;  Surgeon: Wellington Hampshire, MD;  Location: [facility];  Service: Cardiovascular;  Laterality: N/A;  . Cardiac catheterization N/A 04/10/2016    Procedure: Coronary Stent Intervention;  Surgeon: Wellington Hampshire, MD;  Location: [facility];  Service: Cardiovascular;  Laterality: N/A;    Social History  reports that he has never smoked. He does not have any smokeless tobacco history on file. He reports that he drinks alcohol. He reports that he does not use illicit drugs.  Family History family history includes Alzheimer's disease in his mother; CAD in his father; Hypertension in his brother, father, and paternal grandfather.   Review of Systems  Constitutional: Negative.   Respiratory: Positive for chest tightness and shortness of breath.   Cardiovascular: Negative.   Gastrointestinal: Negative.   Musculoskeletal: Negative.   Neurological: Positive for light-headedness.  Hematological: Negative.   Psychiatric/Behavioral: Negative.   All other systems  reviewed and are negative.   BP 104/64 mmHg  Pulse 62  Ht 6' (1.829 m)  Wt 296 lb 4 oz (134.378 kg)  BMI 40.17 kg/m2  Physical Exam  Constitutional: He is oriented to person, place, and time. He appears well-developed and well-nourished.  Obese  HENT:  Head: Normocephalic.  Nose: Nose normal.  Mouth/Throat: Oropharynx is clear and moist.  Eyes: Conjunctivae are normal. Pupils are equal, round, and reactive to light.  Neck: Normal range of motion. Neck supple. No JVD present.  Cardiovascular: Normal rate, regular rhythm, normal heart sounds and intact distal pulses.  Exam reveals no gallop and no friction rub.   No murmur heard. Pulmonary/Chest: Effort normal and breath sounds normal. No respiratory distress. He has no wheezes. He has no rales. He exhibits no tenderness.  Abdominal: Soft. Bowel sounds are normal. He exhibits no distension. There is no tenderness.  Musculoskeletal: Normal range of motion. He exhibits no edema or tenderness.  Lymphadenopathy:    He has no cervical adenopathy.  Neurological: He is alert and oriented to person, place, and time. Coordination normal.  Skin: Skin is warm and dry. No rash noted. No erythema.  Psychiatric: He has a normal mood and affect. His behavior is normal. Judgment and thought content normal.

## 2016-04-18 NOTE — Patient Instructions (Addendum)
You are doing well.  Please decrease the micardis to 20mg  daily. Please take at night.  Take lasix with potassium as needed   If the SOB continues, call the office   Begin Cardiac Rehab in 3 weeks  Please call us if you have new issues that need to be addressed before your next appt.  Your physician wants you to follow-up in: 3 months.  You will receive a reminder letter in the mail two months in advance. If you don't receive a letter, please call our office to schedule the follow-up appointment.

## 2016-04-18 NOTE — Telephone Encounter (Signed)
Is he ok to proceed w/ scheduling cataract surgery?

## 2016-04-18 NOTE — Assessment & Plan Note (Signed)
DES placed to the proximal LAD Long discussion concerning his medications, will continue aspirin and brilinta but he is having shortness of breath

## 2016-04-20 NOTE — Telephone Encounter (Signed)
Okay to have cataract surgery Typically they can do this on all of the blood thinners Would not stop any of the aspirin or brilinta

## 2016-04-20 NOTE — Telephone Encounter (Signed)
Spoke with patient and let him know that per Dr. Rockey Situ he could move forward with cataract surgery and does not have to hold any of his blood thinners. Patient verbalized understanding and had no further questions at this time.

## 2016-05-01 ENCOUNTER — Encounter: Payer: BLUE CROSS/BLUE SHIELD | Attending: Cardiovascular Disease | Admitting: *Deleted

## 2016-05-01 VITALS — Ht 71.0 in | Wt 293.0 lb

## 2016-05-01 DIAGNOSIS — Z9861 Coronary angioplasty status: Secondary | ICD-10-CM | POA: Diagnosis present

## 2016-05-01 DIAGNOSIS — I213 ST elevation (STEMI) myocardial infarction of unspecified site: Secondary | ICD-10-CM | POA: Diagnosis present

## 2016-05-01 NOTE — Patient Instructions (Signed)
Patient Instructions  Patient Details  Name: Bobby Tanner MRN: [id number] Date of Birth: 1951/03/30 Referring Provider:  Wellington Hampshire, MD  Below are the personal goals you chose as well as exercise and nutrition goals. Our goal is to help you keep on track towards obtaining and maintaining your goals. We will be discussing your progress on these goals with you throughout the program.  Initial Exercise Prescription:     Initial Exercise Prescription - 05/01/16 1500    Date of Initial Exercise RX and Referring Provider   Date 05/01/16   Treadmill   MPH 3.2   Grade 0   Minutes 15   Recumbant Bike   Level 3   Watts 40   Minutes 15   Recumbant Elliptical   Level 3   Watts 30   Minutes 15   REL-XR   Level 4   Watts 40   Minutes 15   T5 Nustep   Level 3   Watts 30   Minutes 15   Prescription Details   Frequency (times per week) 3   Duration Progress to 30 minutes of continuous aerobic without signs/symptoms of physical distress   Intensity   THRR 40-80% of Max Heartrate 110-142   Ratings of Perceived Exertion 11-15   Perceived Dyspnea 0-4   Progression   Progression Continue progressive overload as per policy without signs/symptoms or physical distress.   Resistance Training   Training Prescription Yes   Weight 3   Reps 10-15      Exercise Goals: Frequency: Be able to perform aerobic exercise three times per week working toward 3-5 days per week.  Intensity: Work with a perceived exertion of 11 (fairly light) - 15 (hard) as tolerated. Follow your new exercise prescription and watch for changes in prescription as you progress with the program. Changes will be reviewed with you when they are made.  Duration: You should be able to do 30 minutes of continuous aerobic exercise in addition to a 5 minute warm-up and a 5 minute cool-down routine.  Nutrition Goals: Your personal nutrition goals will be established when you do your nutrition analysis with the  dietician.  The following are nutrition guidelines to follow: Cholesterol < 200mg /day Sodium < 1500mg /day Fiber: Men over 50 yrs - 30 grams per day  Personal Goals:     Personal Goals and Risk Factors at Admission - 05/01/16 1539    Core Components/Risk Factors/Patient Goals on Admission    Weight Management Yes;Obesity;Weight Loss   Intervention Weight Management/Obesity: Establish reasonable short term and long term weight goals.;Obesity: Provide education and appropriate resources to help participant work on and attain dietary goals.;Weight Management: Develop a combined nutrition and exercise program designed to reach desired caloric intake, while maintaining appropriate intake of nutrient and fiber, sodium and fats, and appropriate energy expenditure required for the weight goal.;Weight Management: Provide education and appropriate resources to help participant work on and attain dietary goals.   Admit Weight 293 lb 11.2 oz (133.221 kg)   Goal Weight: Short Term 250 lb (113.399 kg)  by the end of the year.     Goal Weight: Long Term 200 lb (90.719 kg)   Expected Outcomes Short Term: Continue to assess and modify interventions until short term weight is achieved;Long Term: Adherence to nutrition and physical activity/exercise program aimed toward attainment of established weight goal;Weight Loss: Understanding of general recommendations for a balanced deficit meal plan, which promotes 1-2 lb weight loss per week and includes a negative  energy balance of [phone] kcal/d;Understanding recommendations for meals to include 15-35% energy as protein, 25-35% energy from fat, 35-60% energy from carbohydrates, less than 200mg  of dietary cholesterol, 20-35 gm of total fiber daily;Understanding of distribution of calorie intake throughout the day with the consumption of 4-5 meals/snacks   Sedentary Yes   Intervention Provide advice, education, support and counseling about physical activity/exercise  needs.;Develop an individualized exercise prescription for aerobic and resistive training based on initial evaluation findings, risk stratification, comorbidities and participant's personal goals.   Expected Outcomes Achievement of increased cardiorespiratory fitness and enhanced flexibility, muscular endurance and strength shown through measurements of functional capacity and personal statement of participant.   Increase Strength and Stamina Yes   Intervention Provide advice, education, support and counseling about physical activity/exercise needs.;Develop an individualized exercise prescription for aerobic and resistive training based on initial evaluation findings, risk stratification, comorbidities and participant's personal goals.   Expected Outcomes Achievement of increased cardiorespiratory fitness and enhanced flexibility, muscular endurance and strength shown through measurements of functional capacity and personal statement of participant.   Hypertension Yes   Intervention Provide education on lifestyle modifcations including regular physical activity/exercise, weight management, moderate sodium restriction and increased consumption of fresh fruit, vegetables, and low fat dairy, alcohol moderation, and smoking cessation.;Monitor prescription use compliance.   Expected Outcomes Short Term: Continued assessment and intervention until BP is < 140/67mm HG in hypertensive participants. < 130/60mm HG in hypertensive participants with diabetes, heart failure or chronic kidney disease.;Long Term: Maintenance of blood pressure at goal levels.   Lipids Yes   Intervention Provide education and support for participant on nutrition & aerobic/resistive exercise along with prescribed medications to achieve LDL 70mg , HDL >40mg .   Expected Outcomes Short Term: Participant states understanding of desired cholesterol values and is compliant with medications prescribed. Participant is following exercise  prescription and nutrition guidelines.;Long Term: Cholesterol controlled with medications as prescribed, with individualized exercise RX and with personalized nutrition plan. Value goals: LDL < 70mg , HDL > 40 mg.      Tobacco Use Initial Evaluation: History  Smoking status  . Never Smoker   Smokeless tobacco  . Not on file    Comment: no smokers in  home    Copy of goals given to participant.

## 2016-05-01 NOTE — Progress Notes (Signed)
Cardiac Individual Treatment Plan  Patient Details  Name: Bobby Tanner MRN: [id number] Date of Birth: [birth year] Referring Provider:    Initial Encounter Date:       Cardiac Rehab from 05/01/2016 in [facility] Cardiac and Pulmonary Rehab   Date  05/01/16      Visit Diagnosis: ST elevation myocardial infarction (STEMI), unspecified artery ([place])  S/P PTCA (percutaneous transluminal coronary angioplasty)  Patient's Home Medications on Admission:  Current outpatient prescriptions:  .  aspirin EC 81 MG tablet, Take 81 mg by mouth daily., Disp: , Rfl:  .  atorvastatin (LIPITOR) 80 MG tablet, Take 1 tablet (80 mg total) by mouth daily at 6 PM., Disp: 30 tablet, Rfl: 11 .  carvedilol (COREG) 3.125 MG tablet, Take 1 tablet (3.125 mg total) by mouth 2 (two) times daily with a meal., Disp: 60 tablet, Rfl: 11 .  fluticasone (FLONASE) 50 MCG/ACT nasal spray, Place 2 sprays into both nostrils daily. Reported on 05/01/2016, Disp: , Rfl:  .  furosemide (LASIX) 20 MG tablet, Take 1 tablet (20 mg total) by mouth daily as needed., Disp: 30 tablet, Rfl: 6 .  lansoprazole (PREVACID) 30 MG capsule, Take 30 mg by mouth daily., Disp: , Rfl:  .  nitroGLYCERIN (NITROSTAT) 0.4 MG SL tablet, Place 1 tablet (0.4 mg total) under the tongue every 5 (five) minutes x 3 doses as needed for chest pain., Disp: 25 tablet, Rfl: 12 .  potassium chloride (K-DUR) 10 MEQ tablet, Take 1 tablet (10 mEq total) by mouth daily as needed., Disp: 30 tablet, Rfl: 6 .  telmisartan (MICARDIS) 20 MG tablet, Take 1 tablet (20 mg total) by mouth daily., Disp: 30 tablet, Rfl: 6 .  ticagrelor (BRILINTA) 90 MG TABS tablet, Take 1 tablet (90 mg total) by mouth 2 (two) times daily., Disp: 60 tablet, Rfl: 0 .  Difluprednate 0.05 % EMUL, Apply 1 drop to eye 2 (two) times daily. Reported on 05/01/2016, Disp: , Rfl:  .  nepafenac (NEVANAC) 0.1 % ophthalmic suspension, Place 1 drop into the right eye at bedtime. Reported on 05/01/2016, Disp: , Rfl:   Past  Medical History: Past Medical History  Diagnosis Date  . Obstructive sleep apnea     a. Compliant w/ CPAP.  Marland Kitchen Essential hypertension   . GERD (gastroesophageal reflux disease)   . Diet-controlled diabetes mellitus ([place])   . HOH (hard of hearing)   . Skin cancer     a. Basal cell carcinoma of neck s/p resection.  . Hyperlipidemia   . Anginal pain ([place])     a. Reports nl cardiac w/u ~ 2014.  . Orthopnea     a. Improved with CPAP.  Marland Kitchen Asthma     a. had as a child. pollen could aggrevate but does not use inhalers  . Arthritis     a. Hands  . Morbid obesity ([place])   . Hemorrhoids   . Cataract     a. 03/2016 s/p R cataract extraction and IOL placement.  Marland Kitchen CAD (coronary artery disease)     a. anterior wall ST elevation MI 0000000 complicated by multiple VT arrests requiring multiple defibs; b. cardiac cath 04/10/16: pLAD 99% s/p aspiration thrombectomy and PCI/DES 0% residual stenosis, D1 50%, mod to sev elevated LVEDP, mildly reduced LVSF  . ST elevation myocardial infarction (STEMI) of anterior wall ([place]) 04/10/2016  . Ischemic cardiomyopathy     a. echo 04/10/16: EF 50-55%, AK of apical wall, HK of anteroapical wall, LV dias fxn nl, mild MR,  RV sys fxn nl, PASP nl  . Cardiac arrest with ventricular fibrillation ([place]) 04/10/2016    a. VT/VF arrest x 2 in setting of anterior ST elevation MI requiring multiple defibs, chest compressions, amio, epi    Tobacco Use: History  Smoking status  . Never Smoker   Smokeless tobacco  . Not on file    Comment: no smokers in  home    Labs: Recent Review Flowsheet Data    Labs for ITP Cardiac and Pulmonary Rehab Latest Ref Rng 04/11/2016   Cholestrol 0 - 200 mg/dL 189   LDLCALC 0 - 99 mg/dL 132(H)   HDL >40 mg/dL 37(L)   Trlycerides <150 mg/dL 100   Hemoglobin A1c 4.0 - 6.0 % 6.3(H)       Exercise Target Goals: Date: 05/01/16  Exercise Program Goal: Individual exercise prescription set with THRR, safety & activity barriers. Participant  demonstrates ability to understand and report RPE using BORG scale, to self-measure pulse accurately, and to acknowledge the importance of the exercise prescription.  Exercise Prescription Goal: Starting with aerobic activity 30 plus minutes a day, 3 days per week for initial exercise prescription. Provide home exercise prescription and guidelines that participant acknowledges understanding prior to discharge.  Activity Barriers & Risk Stratification:     Activity Barriers & Cardiac Risk Stratification - 05/01/16 1528    Activity Barriers & Cardiac Risk Stratification   Activity Barriers Arthritis;Joint Problems;Neck/Spine Problems;Deconditioning;Shortness of Breath;Other (comment)   Comments right calf problems - used to be a runner and right calf muscle started tightening up and causing pain.  Had to stop pain.  Arthritis of hand mostly.     Cardiac Risk Stratification High      6 Minute Walk:     6 Minute Walk      05/01/16 1534       6 Minute Walk   Phase Initial     Distance 1725 feet     Walk Time 6 minutes     # of Rest Breaks 0     MPH 3.27     METS 3.5     RPE 12     Symptoms No     Resting HR 57 bpm     Resting BP 120/70 mmHg     Max Ex. HR 102 bpm     Max Ex. BP 158/72 mmHg        Initial Exercise Prescription:     Initial Exercise Prescription - 05/01/16 1500    Date of Initial Exercise RX and Referring Provider   Date 05/01/16   Treadmill   MPH 3.2   Grade 0   Minutes 15   Recumbant Bike   Level 3   Watts 40   Minutes 15   Recumbant Elliptical   Level 3   Watts 30   Minutes 15   REL-XR   Level 4   Watts 40   Minutes 15   T5 Nustep   Level 3   Watts 30   Minutes 15   Prescription Details   Frequency (times per week) 3   Duration Progress to 30 minutes of continuous aerobic without signs/symptoms of physical distress   Intensity   THRR 40-80% of Max Heartrate 110-142   Ratings of Perceived Exertion 11-15   Perceived Dyspnea 0-4    Progression   Progression Continue progressive overload as per policy without signs/symptoms or physical distress.   Resistance Training   Training Prescription Yes   Weight 3  Reps 10-15      Perform Capillary Blood Glucose checks as needed.  Exercise Prescription Changes:   Exercise Comments:   Discharge Exercise Prescription (Final Exercise Prescription Changes):   Nutrition:  Target Goals: Understanding of nutrition guidelines, daily intake of sodium 1500mg , cholesterol 200mg , calories 30% from fat and 7% or less from saturated fats, daily to have 5 or more servings of fruits and vegetables.  Biometrics:     Pre Biometrics - 05/01/16 1541    Pre Biometrics   Height 5\' 11"  (1.803 m)   Weight 293 lb (132.904 kg)   Waist Circumference 49.5 inches   Hip Circumference 52 inches   Waist to Hip Ratio 0.95 %   BMI (Calculated) 41       Nutrition Therapy Plan and Nutrition Goals:   Nutrition Discharge: Rate Your Plate Scores:   Nutrition Goals Re-Evaluation:   Psychosocial: Target Goals: Acknowledge presence or absence of depression, maximize coping skills, provide positive support system. Participant is able to verbalize types and ability to use techniques and skills needed for reducing stress and depression.  Initial Review & Psychosocial Screening:     Initial Psych Review & Screening - 05/01/16 [place]? Yes   Comments Wife, Carlyon Shadow is very supportive along with 3 children, 3 grandchildren and 1 great grandchild.  Affiliated with [facility] in [place], [place].     Barriers   Psychosocial barriers to participate in program There are no identifiable barriers or psychosocial needs.   Screening Interventions   Interventions Encouraged to exercise;Program counselor consult      Quality of Life Scores:     Quality of Life - 05/01/16 1850    Quality of Life Scores   Health/Function Pre 22 %    Socioeconomic Pre 25.07 %   Psych/Spiritual Pre 22.14 %   Family Pre 27.6 %   GLOBAL Pre 23.49 %      PHQ-9:     Recent Review Flowsheet Data    Depression screen [facility] 2/9 05/01/2016   Decreased Interest 1   Down, Depressed, Hopeless 0   PHQ - 2 Score 1   Altered sleeping 2   Tired, decreased energy 1   Change in appetite 0   Feeling bad or failure about yourself  0   Trouble concentrating 0   Suicidal thoughts 0   PHQ-9 Score 4   Difficult doing work/chores Not difficult at all      Psychosocial Evaluation and Intervention:   Psychosocial Re-Evaluation:   Vocational Rehabilitation: Provide vocational rehab assistance to qualifying candidates.   Vocational Rehab Evaluation & Intervention:     Vocational Rehab - 05/01/16 1536    Initial Vocational Rehab Evaluation & Intervention   Assessment shows need for Vocational Rehabilitation No      Education: Education Goals: Education classes will be provided on a weekly basis, covering required topics. Participant will state understanding/return demonstration of topics presented.  Learning Barriers/Preferences:     Learning Barriers/Preferences - 05/01/16 1532    Learning Barriers/Preferences   Learning Barriers Hearing;Sight   Learning Preferences Individual Instruction;Video      Education Topics: General Nutrition Guidelines/Fats and Fiber: -Group instruction provided by verbal, written material, models and posters to present the general guidelines for heart healthy nutrition. Gives an explanation and review of dietary fats and fiber.   Controlling Sodium/Reading Food Labels: -Group verbal and written material supporting the discussion of sodium use in heart healthy nutrition.  Review and explanation with models, verbal and written materials for utilization of the food label.   Exercise Physiology & Risk Factors: - Group verbal and written instruction with models to review the exercise physiology of the  cardiovascular system and associated critical values. Details cardiovascular disease risk factors and the goals associated with each risk factor.   Aerobic Exercise & Resistance Training: - Gives group verbal and written discussion on the health impact of inactivity. On the components of aerobic and resistive training programs and the benefits of this training and how to safely progress through these programs.   Flexibility, Balance, General Exercise Guidelines: - Provides group verbal and written instruction on the benefits of flexibility and balance training programs. Provides general exercise guidelines with specific guidelines to those with heart or lung disease. Demonstration and skill practice provided.   Stress Management: - Provides group verbal and written instruction about the health risks of elevated stress, cause of high stress, and healthy ways to reduce stress.   Depression: - Provides group verbal and written instruction on the correlation between heart/lung disease and depressed mood, treatment options, and the stigmas associated with seeking treatment.   Anatomy & Physiology of the Heart: - Group verbal and written instruction and models provide basic cardiac anatomy and physiology, with the coronary electrical and arterial systems. Review of: AMI, Angina, Valve disease, Heart Failure, Cardiac Arrhythmia, Pacemakers, and the ICD.   Cardiac Procedures: - Group verbal and written instruction and models to describe the testing methods done to diagnose heart disease. Reviews the outcomes of the test results. Describes the treatment choices: Medical Management, Angioplasty, or Coronary Bypass Surgery.   Cardiac Medications: - Group verbal and written instruction to review commonly prescribed medications for heart disease. Reviews the medication, class of the drug, and side effects. Includes the steps to properly store meds and maintain the prescription regimen.   Go  Sex-Intimacy & Heart Disease, Get SMART - Goal Setting: - Group verbal and written instruction through game format to discuss heart disease and the return to sexual intimacy. Provides group verbal and written material to discuss and apply goal setting through the application of the S.M.A.R.T. Method.   Other Matters of the Heart: - Provides group verbal, written materials and models to describe Heart Failure, Angina, Valve Disease, and Diabetes in the realm of heart disease. Includes description of the disease process and treatment options available to the cardiac patient.   Exercise & Equipment Safety: - Individual verbal instruction and demonstration of equipment use and safety with use of the equipment.          Cardiac Rehab from 05/01/2016 in [facility] Cardiac and Pulmonary Rehab   Date  05/01/16   Educator  DW   Instruction Review Code  1- partially meets, needs review/practice      Infection Prevention: - Provides verbal and written material to individual with discussion of infection control including proper hand washing and proper equipment cleaning during exercise session.      Cardiac Rehab from 05/01/2016 in [facility] Cardiac and Pulmonary Rehab   Date  05/01/16   Educator  DW   Instruction Review Code  2- meets goals/outcomes      Falls Prevention: - Provides verbal and written material to individual with discussion of falls prevention and safety.      Cardiac Rehab from 05/01/2016 in [facility] Cardiac and Pulmonary Rehab   Date  05/01/16   Educator  DW   Instruction Review Code  2- meets goals/outcomes  Diabetes: - Individual verbal and written instruction to review signs/symptoms of diabetes, desired ranges of glucose level fasting, after meals and with exercise. Advice that pre and post exercise glucose checks will be done for 3 sessions at entry of program.    Knowledge Questionnaire Score:     Knowledge Questionnaire Score - 05/01/16 1533    Knowledge Questionnaire Score    Pre Score 24/28      Core Components/Risk Factors/Patient Goals at Admission:     Personal Goals and Risk Factors at Admission - 05/01/16 1539    Core Components/Risk Factors/Patient Goals on Admission    Weight Management Yes;Obesity;Weight Loss   Intervention Weight Management/Obesity: Establish reasonable short term and long term weight goals.;Obesity: Provide education and appropriate resources to help participant work on and attain dietary goals.;Weight Management: Develop a combined nutrition and exercise program designed to reach desired caloric intake, while maintaining appropriate intake of nutrient and fiber, sodium and fats, and appropriate energy expenditure required for the weight goal.;Weight Management: Provide education and appropriate resources to help participant work on and attain dietary goals.   Admit Weight 293 lb 11.2 oz (133.221 kg)   Goal Weight: Short Term 250 lb (113.399 kg)  by the end of the year.     Goal Weight: Long Term 200 lb (90.719 kg)   Expected Outcomes Short Term: Continue to assess and modify interventions until short term weight is achieved;Long Term: Adherence to nutrition and physical activity/exercise program aimed toward attainment of established weight goal;Weight Loss: Understanding of general recommendations for a balanced deficit meal plan, which promotes 1-2 lb weight loss per week and includes a negative energy balance of [id number] kcal/d;Understanding recommendations for meals to include 15-35% energy as protein, 25-35% energy from fat, 35-60% energy from carbohydrates, less than 200mg  of dietary cholesterol, 20-35 gm of total fiber daily;Understanding of distribution of calorie intake throughout the day with the consumption of 4-5 meals/snacks   Sedentary Yes   Intervention Provide advice, education, support and counseling about physical activity/exercise needs.;Develop an individualized exercise prescription for aerobic and resistive training  based on initial evaluation findings, risk stratification, comorbidities and participant's personal goals.   Expected Outcomes Achievement of increased cardiorespiratory fitness and enhanced flexibility, muscular endurance and strength shown through measurements of functional capacity and personal statement of participant.   Increase Strength and Stamina Yes   Intervention Provide advice, education, support and counseling about physical activity/exercise needs.;Develop an individualized exercise prescription for aerobic and resistive training based on initial evaluation findings, risk stratification, comorbidities and participant's personal goals.   Expected Outcomes Achievement of increased cardiorespiratory fitness and enhanced flexibility, muscular endurance and strength shown through measurements of functional capacity and personal statement of participant.   Hypertension Yes   Intervention Provide education on lifestyle modifcations including regular physical activity/exercise, weight management, moderate sodium restriction and increased consumption of fresh fruit, vegetables, and low fat dairy, alcohol moderation, and smoking cessation.;Monitor prescription use compliance.   Expected Outcomes Short Term: Continued assessment and intervention until BP is < 140/57mm HG in hypertensive participants. < 130/79mm HG in hypertensive participants with diabetes, heart failure or chronic kidney disease.;Long Term: Maintenance of blood pressure at goal levels.   Lipids Yes   Intervention Provide education and support for participant on nutrition & aerobic/resistive exercise along with prescribed medications to achieve LDL 70mg , HDL >40mg .   Expected Outcomes Short Term: Participant states understanding of desired cholesterol values and is compliant with medications prescribed. Participant is following exercise prescription and nutrition guidelines.;Long  Term: Cholesterol controlled with medications as  prescribed, with individualized exercise RX and with personalized nutrition plan. Value goals: LDL < 70mg , HDL > 40 mg.      Core Components/Risk Factors/Patient Goals Review:    Core Components/Risk Factors/Patient Goals at Discharge (Final Review):    ITP Comments:   Comments:  Amadeo will be starting Cardiac Rehab on Thursday, June 8th, 2017 at 0800.

## 2016-05-03 DIAGNOSIS — I2510 Atherosclerotic heart disease of native coronary artery without angina pectoris: Secondary | ICD-10-CM | POA: Insufficient documentation

## 2016-05-04 ENCOUNTER — Encounter: Payer: BLUE CROSS/BLUE SHIELD | Admitting: *Deleted

## 2016-05-04 DIAGNOSIS — I213 ST elevation (STEMI) myocardial infarction of unspecified site: Secondary | ICD-10-CM

## 2016-05-04 DIAGNOSIS — Z9861 Coronary angioplasty status: Secondary | ICD-10-CM

## 2016-05-04 NOTE — Progress Notes (Signed)
Daily Session Note  Patient Details  Name: Bobby Tanner MRN: [id number] Date of Birth: 06/11/1951 Referring Provider:            Cardiac Rehab from 05/04/2016 in [facility] Cardiac and Pulmonary Rehab   Referring Provider  Emily Filbert MD      Encounter Date: 05/04/2016  Check In:     Session Check In - 05/04/16 4739    Check-In   Location [facility]-Cardiac & Pulmonary Rehab   Staff Present Alberteen Sam, MA, ACSM RCEP, Exercise Physiologist;Amanda Oletta Darter, BA, ACSM CEP, Exercise Physiologist;Diane Joya Gaskins, RN, BSN   Supervising physician immediately available to respond to emergencies See telemetry face sheet for immediately available ER MD   Medication changes reported     No   Fall or balance concerns reported    No   Warm-up and Cool-down Performed on first and last piece of equipment   Resistance Training Performed Yes   VAD Patient? No   Pain Assessment   Currently in Pain? No/denies   Multiple Pain Sites No         Goals Met:  Exercise tolerated well Personal goals reviewed No report of cardiac concerns or symptoms Strength training completed today  Goals Unmet:  Not Applicable  Comments: First full day of exercise!  Patient was oriented to gym and equipment including functions, settings, policies, and procedures.  Patient's individual exercise prescription and treatment plan were reviewed.  All starting workloads were established based on the results of the 6 minute walk test done at initial orientation visit.  The plan for exercise progression was also introduced and progression will be customized based on patient's performance and goals.    Dr. Emily Filbert is Medical Director for [place] and [facility].

## 2016-05-08 ENCOUNTER — Other Ambulatory Visit: Payer: Self-pay | Admitting: Physician Assistant

## 2016-05-09 ENCOUNTER — Encounter: Payer: BLUE CROSS/BLUE SHIELD | Admitting: *Deleted

## 2016-05-09 DIAGNOSIS — I213 ST elevation (STEMI) myocardial infarction of unspecified site: Secondary | ICD-10-CM | POA: Diagnosis not present

## 2016-05-09 NOTE — Progress Notes (Signed)
Daily Session Note  Patient Details  Name: Bobby Tanner MRN: [id number] Date of Birth: 05/09/1951 Referring Provider:        Cardiac Rehab from 05/04/2016 in [facility] Cardiac and Pulmonary Rehab   Referring Provider  Emily Filbert MD      Encounter Date: 05/09/2016  Check In:     Session Check In - 05/09/16 0847    Check-In   Location [facility]-Cardiac & Pulmonary Rehab   Staff Present Gerlene Burdock, RN, BSN;Diane Joya Gaskins, RN, Levie Heritage, MA, ACSM RCEP, Exercise Physiologist   Supervising physician immediately available to respond to emergencies See telemetry face sheet for immediately available ER MD   Medication changes reported     No   Fall or balance concerns reported    No   Warm-up and Cool-down Performed on first and last piece of equipment   Resistance Training Performed Yes   VAD Patient? No   Pain Assessment   Currently in Pain? No/denies         Goals Met:  Proper associated with RPD/PD & O2 Sat Exercise tolerated well  Goals Unmet:  Not Applicable  Comments:     Dr. Emily Filbert is Medical Director for [place] and [facility].

## 2016-05-09 NOTE — Progress Notes (Signed)
Cardiac Individual Treatment Plan  Patient Details  Name: Bobby Tanner MRN: [id number] Date of Birth: 1951/03/26 Referring Provider:        Cardiac Rehab from 05/04/2016 in [facility] Cardiac and Pulmonary Rehab   Referring Provider  Emily Filbert MD      Initial Encounter Date:       Cardiac Rehab from 05/04/2016 in [facility] Cardiac and Pulmonary Rehab   Referring Provider  Emily Filbert MD      Visit Diagnosis: ST elevation myocardial infarction (STEMI), unspecified artery ([place])  Patient's Home Medications on Admission:  Current outpatient prescriptions:  .  aspirin EC 81 MG tablet, Take 81 mg by mouth daily., Disp: , Rfl:  .  atorvastatin (LIPITOR) 80 MG tablet, Take 1 tablet (80 mg total) by mouth daily at 6 PM., Disp: 30 tablet, Rfl: 11 .  BRILINTA 90 MG TABS tablet, TAKE ONE TABLET BY MOUTH TWICE DAILY, Disp: 60 tablet, Rfl: 3 .  carvedilol (COREG) 3.125 MG tablet, Take 1 tablet (3.125 mg total) by mouth 2 (two) times daily with a meal., Disp: 60 tablet, Rfl: 11 .  Difluprednate 0.05 % EMUL, Apply 1 drop to eye 2 (two) times daily. Reported on 05/01/2016, Disp: , Rfl:  .  fluticasone (FLONASE) 50 MCG/ACT nasal spray, Place 2 sprays into both nostrils daily. Reported on 05/01/2016, Disp: , Rfl:  .  furosemide (LASIX) 20 MG tablet, Take 1 tablet (20 mg total) by mouth daily as needed., Disp: 30 tablet, Rfl: 6 .  lansoprazole (PREVACID) 30 MG capsule, Take 30 mg by mouth daily., Disp: , Rfl:  .  nepafenac (NEVANAC) 0.1 % ophthalmic suspension, Place 1 drop into the right eye at bedtime. Reported on 05/01/2016, Disp: , Rfl:  .  nitroGLYCERIN (NITROSTAT) 0.4 MG SL tablet, Place 1 tablet (0.4 mg total) under the tongue every 5 (five) minutes x 3 doses as needed for chest pain., Disp: 25 tablet, Rfl: 12 .  potassium chloride (K-DUR) 10 MEQ tablet, Take 1 tablet (10 mEq total) by mouth daily as needed., Disp: 30 tablet, Rfl: 6 .  telmisartan (MICARDIS) 20 MG tablet, Take 1 tablet (20 mg total) by mouth  daily., Disp: 30 tablet, Rfl: 6  Past Medical History: Past Medical History  Diagnosis Date  . Obstructive sleep apnea     a. Compliant w/ CPAP.  Marland Kitchen Essential hypertension   . GERD (gastroesophageal reflux disease)   . Diet-controlled diabetes mellitus ([place])   . HOH (hard of hearing)   . Skin cancer     a. Basal cell carcinoma of neck s/p resection.  . Hyperlipidemia   . Anginal pain ([place])     a. Reports nl cardiac w/u ~ 2014.  . Orthopnea     a. Improved with CPAP.  Marland Kitchen Asthma     a. had as a child. pollen could aggrevate but does not use inhalers  . Arthritis     a. Hands  . Morbid obesity ([place])   . Hemorrhoids   . Cataract     a. 03/2016 s/p R cataract extraction and IOL placement.  Marland Kitchen CAD (coronary artery disease)     a. anterior wall ST elevation MI 0000000 complicated by multiple VT arrests requiring multiple defibs; b. cardiac cath 04/10/16: pLAD 99% s/p aspiration thrombectomy and PCI/DES 0% residual stenosis, D1 50%, mod to sev elevated LVEDP, mildly reduced LVSF  . ST elevation myocardial infarction (STEMI) of anterior wall ([place]) 04/10/2016  . Ischemic cardiomyopathy     a. echo  04/10/16: EF 50-55%, AK of apical wall, HK of anteroapical wall, LV dias fxn nl, mild MR, RV sys fxn nl, PASP nl  . Cardiac arrest with ventricular fibrillation ([place]) 04/10/2016    a. VT/VF arrest x 2 in setting of anterior ST elevation MI requiring multiple defibs, chest compressions, amio, epi    Tobacco Use: History  Smoking status  . Never Smoker   Smokeless tobacco  . Not on file    Comment: no smokers in  home    Labs: Recent Review Flowsheet Data    Labs for ITP Cardiac and Pulmonary Rehab Latest Ref Rng 04/11/2016   Cholestrol 0 - 200 mg/dL 189   LDLCALC 0 - 99 mg/dL 132(H)   HDL >40 mg/dL 37(L)   Trlycerides <150 mg/dL 100   Hemoglobin A1c 4.0 - 6.0 % 6.3(H)       Exercise Target Goals:    Exercise Program Goal: Individual exercise prescription set with THRR, safety &  activity barriers. Participant demonstrates ability to understand and report RPE using BORG scale, to self-measure pulse accurately, and to acknowledge the importance of the exercise prescription.  Exercise Prescription Goal: Starting with aerobic activity 30 plus minutes a day, 3 days per week for initial exercise prescription. Provide home exercise prescription and guidelines that participant acknowledges understanding prior to discharge.  Activity Barriers & Risk Stratification:     Activity Barriers & Cardiac Risk Stratification - 05/01/16 1528    Activity Barriers & Cardiac Risk Stratification   Activity Barriers Arthritis;Joint Problems;Neck/Spine Problems;Deconditioning;Shortness of Breath;Other (comment)   Comments right calf problems - used to be a runner and right calf muscle started tightening up and causing pain.  Had to stop pain.  Arthritis of hand mostly.     Cardiac Risk Stratification High      6 Minute Walk:     6 Minute Walk      05/01/16 1534       6 Minute Walk   Phase Initial     Distance 1725 feet     Walk Time 6 minutes     # of Rest Breaks 0     MPH 3.27     METS 3.5     RPE 12     Symptoms No     Resting HR 57 bpm     Resting BP 120/70 mmHg     Max Ex. HR 102 bpm     Max Ex. BP 158/72 mmHg        Initial Exercise Prescription:     Initial Exercise Prescription - 05/04/16 0800    Date of Initial Exercise RX and Referring Provider   Referring Provider Emily Filbert MD      Perform Capillary Blood Glucose checks as needed.  Exercise Prescription Changes:   Exercise Comments:     Exercise Comments      05/04/16 0825           Exercise Comments First full day of exercise!  He did great for his first day.  Patient was oriented to gym and equipment including functions, settings, policies, and procedures.  Patient's individual exercise prescription and treatment plan were reviewed.  All starting workloads were established based on the  results of the 6 minute walk test done at initial orientation visit.  The plan for exercise progression was also introduced and progression will be customized based on patient's performance and goals.          Discharge Exercise Prescription (Final Exercise Prescription  Changes):   Nutrition:  Target Goals: Understanding of nutrition guidelines, daily intake of sodium 1500mg , cholesterol 200mg , calories 30% from fat and 7% or less from saturated fats, daily to have 5 or more servings of fruits and vegetables.  Biometrics:     Pre Biometrics - 05/01/16 1541    Pre Biometrics   Height 5\' 11"  (1.803 m)   Weight 293 lb (132.904 kg)   Waist Circumference 49.5 inches   Hip Circumference 52 inches   Waist to Hip Ratio 0.95 %   BMI (Calculated) 41       Nutrition Therapy Plan and Nutrition Goals:   Nutrition Discharge: Rate Your Plate Scores:   Nutrition Goals Re-Evaluation:   Psychosocial: Target Goals: Acknowledge presence or absence of depression, maximize coping skills, provide positive support system. Participant is able to verbalize types and ability to use techniques and skills needed for reducing stress and depression.  Initial Review & Psychosocial Screening:     Initial Psych Review & Screening - 05/01/16 [place]? Yes   Comments Wife, Bobby Tanner is very supportive along with 3 children, 3 grandchildren and 1 great grandchild.  Affiliated with [facility] in [place], [place].     Barriers   Psychosocial barriers to participate in program There are no identifiable barriers or psychosocial needs.   Screening Interventions   Interventions Encouraged to exercise;Program counselor consult      Quality of Life Scores:     Quality of Life - 05/01/16 1850    Quality of Life Scores   Health/Function Pre 22 %   Socioeconomic Pre 25.07 %   Psych/Spiritual Pre 22.14 %   Family Pre 27.6 %   GLOBAL Pre 23.49 %       PHQ-9:     Recent Review Flowsheet Data    Depression screen [facility] 2/9 05/01/2016   Decreased Interest 1   Down, Depressed, Hopeless 0   PHQ - 2 Score 1   Altered sleeping 2   Tired, decreased energy 1   Change in appetite 0   Feeling bad or failure about yourself  0   Trouble concentrating 0   Suicidal thoughts 0   PHQ-9 Score 4   Difficult doing work/chores Not difficult at all      Psychosocial Evaluation and Intervention:   Psychosocial Re-Evaluation:   Vocational Rehabilitation: Provide vocational rehab assistance to qualifying candidates.   Vocational Rehab Evaluation & Intervention:     Vocational Rehab - 05/01/16 1536    Initial Vocational Rehab Evaluation & Intervention   Assessment shows need for Vocational Rehabilitation No      Education: Education Goals: Education classes will be provided on a weekly basis, covering required topics. Participant will state understanding/return demonstration of topics presented.  Learning Barriers/Preferences:     Learning Barriers/Preferences - 05/01/16 1532    Learning Barriers/Preferences   Learning Barriers Hearing;Sight   Learning Preferences Individual Instruction;Video      Education Topics: General Nutrition Guidelines/Fats and Fiber: -Group instruction provided by verbal, written material, models and posters to present the general guidelines for heart healthy nutrition. Gives an explanation and review of dietary fats and fiber.   Controlling Sodium/Reading Food Labels: -Group verbal and written material supporting the discussion of sodium use in heart healthy nutrition. Review and explanation with models, verbal and written materials for utilization of the food label.          Cardiac Rehab from 05/04/2016 in [facility]  Cardiac and Pulmonary Rehab   Date  05/04/16   Educator  Jaclyn Shaggy   Instruction Review Code  2- meets goals/outcomes      Exercise Physiology & Risk Factors: - Group verbal and written  instruction with models to review the exercise physiology of the cardiovascular system and associated critical values. Details cardiovascular disease risk factors and the goals associated with each risk factor.   Aerobic Exercise & Resistance Training: - Gives group verbal and written discussion on the health impact of inactivity. On the components of aerobic and resistive training programs and the benefits of this training and how to safely progress through these programs.   Flexibility, Balance, General Exercise Guidelines: - Provides group verbal and written instruction on the benefits of flexibility and balance training programs. Provides general exercise guidelines with specific guidelines to those with heart or lung disease. Demonstration and skill practice provided.   Stress Management: - Provides group verbal and written instruction about the health risks of elevated stress, cause of high stress, and healthy ways to reduce stress.   Depression: - Provides group verbal and written instruction on the correlation between heart/lung disease and depressed mood, treatment options, and the stigmas associated with seeking treatment.   Anatomy & Physiology of the Heart: - Group verbal and written instruction and models provide basic cardiac anatomy and physiology, with the coronary electrical and arterial systems. Review of: AMI, Angina, Valve disease, Heart Failure, Cardiac Arrhythmia, Pacemakers, and the ICD.   Cardiac Procedures: - Group verbal and written instruction and models to describe the testing methods done to diagnose heart disease. Reviews the outcomes of the test results. Describes the treatment choices: Medical Management, Angioplasty, or Coronary Bypass Surgery.   Cardiac Medications: - Group verbal and written instruction to review commonly prescribed medications for heart disease. Reviews the medication, class of the drug, and side effects. Includes the steps to properly  store meds and maintain the prescription regimen.   Go Sex-Intimacy & Heart Disease, Get SMART - Goal Setting: - Group verbal and written instruction through game format to discuss heart disease and the return to sexual intimacy. Provides group verbal and written material to discuss and apply goal setting through the application of the S.M.A.R.T. Method.   Other Matters of the Heart: - Provides group verbal, written materials and models to describe Heart Failure, Angina, Valve Disease, and Diabetes in the realm of heart disease. Includes description of the disease process and treatment options available to the cardiac patient.   Exercise & Equipment Safety: - Individual verbal instruction and demonstration of equipment use and safety with use of the equipment.      Cardiac Rehab from 05/04/2016 in [facility] Cardiac and Pulmonary Rehab   Date  05/01/16   Educator  DW   Instruction Review Code  1- partially meets, needs review/practice      Infection Prevention: - Provides verbal and written material to individual with discussion of infection control including proper hand washing and proper equipment cleaning during exercise session.      Cardiac Rehab from 05/04/2016 in [facility] Cardiac and Pulmonary Rehab   Date  05/01/16   Educator  DW   Instruction Review Code  2- meets goals/outcomes      Falls Prevention: - Provides verbal and written material to individual with discussion of falls prevention and safety.      Cardiac Rehab from 05/04/2016 in [facility] Cardiac and Pulmonary Rehab   Date  05/01/16   Educator  DW   Instruction  Review Code  2- meets goals/outcomes      Diabetes: - Individual verbal and written instruction to review signs/symptoms of diabetes, desired ranges of glucose level fasting, after meals and with exercise. Advice that pre and post exercise glucose checks will be done for 3 sessions at entry of program.    Knowledge Questionnaire Score:     Knowledge Questionnaire  Score - 05/01/16 1533    Knowledge Questionnaire Score   Pre Score 24/28      Core Components/Risk Factors/Patient Goals at Admission:     Personal Goals and Risk Factors at Admission - 05/01/16 1539    Core Components/Risk Factors/Patient Goals on Admission    Weight Management Yes;Obesity;Weight Loss   Intervention Weight Management/Obesity: Establish reasonable short term and long term weight goals.;Obesity: Provide education and appropriate resources to help participant work on and attain dietary goals.;Weight Management: Develop a combined nutrition and exercise program designed to reach desired caloric intake, while maintaining appropriate intake of nutrient and fiber, sodium and fats, and appropriate energy expenditure required for the weight goal.;Weight Management: Provide education and appropriate resources to help participant work on and attain dietary goals.   Admit Weight 293 lb 11.2 oz (133.221 kg)   Goal Weight: Short Term 250 lb (113.399 kg)  by the end of the year.     Goal Weight: Long Term 200 lb (90.719 kg)   Expected Outcomes Short Term: Continue to assess and modify interventions until short term weight is achieved;Long Term: Adherence to nutrition and physical activity/exercise program aimed toward attainment of established weight goal;Weight Loss: Understanding of general recommendations for a balanced deficit meal plan, which promotes 1-2 lb weight loss per week and includes a negative energy balance of [phone] kcal/d;Understanding recommendations for meals to include 15-35% energy as protein, 25-35% energy from fat, 35-60% energy from carbohydrates, less than 200mg  of dietary cholesterol, 20-35 gm of total fiber daily;Understanding of distribution of calorie intake throughout the day with the consumption of 4-5 meals/snacks   Sedentary Yes   Intervention Provide advice, education, support and counseling about physical activity/exercise needs.;Develop an individualized  exercise prescription for aerobic and resistive training based on initial evaluation findings, risk stratification, comorbidities and participant's personal goals.   Expected Outcomes Achievement of increased cardiorespiratory fitness and enhanced flexibility, muscular endurance and strength shown through measurements of functional capacity and personal statement of participant.   Increase Strength and Stamina Yes   Intervention Provide advice, education, support and counseling about physical activity/exercise needs.;Develop an individualized exercise prescription for aerobic and resistive training based on initial evaluation findings, risk stratification, comorbidities and participant's personal goals.   Expected Outcomes Achievement of increased cardiorespiratory fitness and enhanced flexibility, muscular endurance and strength shown through measurements of functional capacity and personal statement of participant.   Hypertension Yes   Intervention Provide education on lifestyle modifcations including regular physical activity/exercise, weight management, moderate sodium restriction and increased consumption of fresh fruit, vegetables, and low fat dairy, alcohol moderation, and smoking cessation.;Monitor prescription use compliance.   Expected Outcomes Short Term: Continued assessment and intervention until BP is < 140/44mm HG in hypertensive participants. < 130/82mm HG in hypertensive participants with diabetes, heart failure or chronic kidney disease.;Long Term: Maintenance of blood pressure at goal levels.   Lipids Yes   Intervention Provide education and support for participant on nutrition & aerobic/resistive exercise along with prescribed medications to achieve LDL 70mg , HDL >40mg .   Expected Outcomes Short Term: Participant states understanding of desired cholesterol values and is compliant  with medications prescribed. Participant is following exercise prescription and nutrition guidelines.;Long  Term: Cholesterol controlled with medications as prescribed, with individualized exercise RX and with personalized nutrition plan. Value goals: LDL < 70mg , HDL > 40 mg.      Core Components/Risk Factors/Patient Goals Review:    Core Components/Risk Factors/Patient Goals at Discharge (Final Review):    ITP Comments:     ITP Comments      05/09/16 0848           ITP Comments Barnard has lost 3 lbs in the past 3 days, down 15lbs since his MI Dequane said he has always had a problem with his weight. He is meeting individually with the [facility] on Thursdsay. His snacks are now fruit snacks. Blood pressure ojk at 120-150/60-70. He is now able to garden 2 hours/per day sometdays and some walking. We discussed how exercising in here should help his overall health incl weight.           Comments: Bobby Tanner reports he is able to walk around the block daily. He said he is still not smoking since February 14, 2016!! He 's blood pressure is better at home and is good here also. He said his stress has decreased since his wife 's infection after her shoulder surgery is ok now and she is getting physical therapy across the street (set up for the 2 days that he is here in Cardiac Rehab).

## 2016-05-09 NOTE — Progress Notes (Signed)
Daily Session Note  Patient Details  Name: Bobby Tanner MRN: [id number] Date of Birth: 12-08-1950 Referring Provider:        Cardiac Rehab from 05/04/2016 in [facility] Cardiac and Pulmonary Rehab   Referring Provider  Emily Filbert MD      Encounter Date: 05/09/2016  Check In:     Session Check In - 05/09/16 0847    Check-In   Location [facility]-Cardiac & Pulmonary Rehab   Staff Present Gerlene Burdock, RN, BSN;Diane Joya Gaskins, RN, Levie Heritage, MA, ACSM RCEP, Exercise Physiologist   Supervising physician immediately available to respond to emergencies See telemetry face sheet for immediately available ER MD   Medication changes reported     No   Fall or balance concerns reported    No   Warm-up and Cool-down Performed on first and last piece of equipment   Resistance Training Performed Yes   VAD Patient? No   Pain Assessment   Currently in Pain? No/denies         Goals Met:  Proper associated with RPD/PD & O2 Sat Exercise tolerated well Personal goals reviewed No report of cardiac concerns or symptoms  Goals Unmet:  Not Applicable  Comments:     Dr. Emily Filbert is Medical Director for [place] and [facility].

## 2016-05-11 ENCOUNTER — Encounter: Payer: BLUE CROSS/BLUE SHIELD | Admitting: *Deleted

## 2016-05-11 DIAGNOSIS — I213 ST elevation (STEMI) myocardial infarction of unspecified site: Secondary | ICD-10-CM | POA: Diagnosis not present

## 2016-05-11 DIAGNOSIS — Z9861 Coronary angioplasty status: Secondary | ICD-10-CM

## 2016-05-11 NOTE — Patient Instructions (Addendum)
Discharge Instructions  Patient Details  Name: Bobby Tanner MRN: [id number] Date of Birth: 05/20/1951 Referring Provider:  Wellington Hampshire, MD   Number of Visits: 8  Reason for Discharge:  Early Exit:  Insurance  Smoking History:  History  Smoking status  . Never Smoker   Smokeless tobacco  . Not on file    Comment: no smokers in  home    Diagnosis:  ST elevation myocardial infarction (STEMI), unspecified artery (HCC)  S/P PTCA (percutaneous transluminal coronary angioplasty)  Initial Exercise Prescription:     Initial Exercise Prescription - 05/11/16 1100    Date of Initial Exercise RX and Referring Provider   Referring Provider Kathlyn Sacramento MD      Discharge Exercise Prescription (Final Exercise Prescription Changes):     Exercise Prescription Changes - 05/11/16 1100    Exercise Review   Progression Yes   Response to Exercise   Blood Pressure (Admit) 104/62 mmHg   Blood Pressure (Exercise) 126/64 mmHg   Blood Pressure (Exit) 124/60 mmHg   Heart Rate (Admit) 59 bpm   Heart Rate (Exercise) 104 bpm   Heart Rate (Exit) 75 bpm   Oxygen Saturation (Exercise) 97 %   Rating of Perceived Exertion (Exercise) 14   Symptoms shortness of breath in general  most likely related to his Birlinta   Duration Progress to 30 minutes of continuous aerobic without signs/symptoms of physical distress   Intensity THRR unchanged   Progression   Progression Continue to progress workloads to maintain intensity without signs/symptoms of physical distress.   Average METs 3.6   Resistance Training   Training Prescription Yes   Weight 3   Reps 10-15   Interval Training   Interval Training No   Treadmill   MPH 3.3   Grade 0.5   Minutes 15   Recumbant Bike   Level 7   Watts 57   Minutes 15   REL-XR   Level 4   Watts 92   Minutes 15   Biostep-RELP   Level 6   Watts 63   Minutes 15   METs 2.5      Functional Capacity:     6 Minute Walk      05/01/16 1534        6 Minute Walk   Phase Initial     Distance 1725 feet     Walk Time 6 minutes     # of Rest Breaks 0     MPH 3.27     METS 3.5     RPE 12     Symptoms No     Resting HR 57 bpm     Resting BP 120/70 mmHg     Max Ex. HR 102 bpm     Max Ex. BP 158/72 mmHg        Quality of Life:     Quality of Life - 05/01/16 1850    Quality of Life Scores   Health/Function Pre 22 %   Socioeconomic Pre 25.07 %   Psych/Spiritual Pre 22.14 %   Family Pre 27.6 %   GLOBAL Pre 23.49 %      Personal Goals: Goals established at orientation with interventions provided to work toward goal.     Personal Goals and Risk Factors at Admission - 05/01/16 1539    Core Components/Risk Factors/Patient Goals on Admission    Weight Management Yes;Obesity;Weight Loss   Intervention Weight Management/Obesity: Establish reasonable short term and long term weight goals.;Obesity: Provide education  and appropriate resources to help participant work on and attain dietary goals.;Weight Management: Develop a combined nutrition and exercise program designed to reach desired caloric intake, while maintaining appropriate intake of nutrient and fiber, sodium and fats, and appropriate energy expenditure required for the weight goal.;Weight Management: Provide education and appropriate resources to help participant work on and attain dietary goals.   Admit Weight 293 lb 11.2 oz (133.221 kg)   Goal Weight: Short Term 250 lb (113.399 kg)  by the end of the year.     Goal Weight: Long Term 200 lb (90.719 kg)   Expected Outcomes Short Term: Continue to assess and modify interventions until short term weight is achieved;Long Term: Adherence to nutrition and physical activity/exercise program aimed toward attainment of established weight goal;Weight Loss: Understanding of general recommendations for a balanced deficit meal plan, which promotes 1-2 lb weight loss per week and includes a negative energy balance of [phone]  kcal/d;Understanding recommendations for meals to include 15-35% energy as protein, 25-35% energy from fat, 35-60% energy from carbohydrates, less than 200mg  of dietary cholesterol, 20-35 gm of total fiber daily;Understanding of distribution of calorie intake throughout the day with the consumption of 4-5 meals/snacks   Sedentary Yes   Intervention Provide advice, education, support and counseling about physical activity/exercise needs.;Develop an individualized exercise prescription for aerobic and resistive training based on initial evaluation findings, risk stratification, comorbidities and participant's personal goals.   Expected Outcomes Achievement of increased cardiorespiratory fitness and enhanced flexibility, muscular endurance and strength shown through measurements of functional capacity and personal statement of participant.   Increase Strength and Stamina Yes   Intervention Provide advice, education, support and counseling about physical activity/exercise needs.;Develop an individualized exercise prescription for aerobic and resistive training based on initial evaluation findings, risk stratification, comorbidities and participant's personal goals.   Expected Outcomes Achievement of increased cardiorespiratory fitness and enhanced flexibility, muscular endurance and strength shown through measurements of functional capacity and personal statement of participant.   Hypertension Yes   Intervention Provide education on lifestyle modifcations including regular physical activity/exercise, weight management, moderate sodium restriction and increased consumption of fresh fruit, vegetables, and low fat dairy, alcohol moderation, and smoking cessation.;Monitor prescription use compliance.   Expected Outcomes Short Term: Continued assessment and intervention until BP is < 140/44mm HG in hypertensive participants. < 130/64mm HG in hypertensive participants with diabetes, heart failure or chronic kidney  disease.;Long Term: Maintenance of blood pressure at goal levels.   Lipids Yes   Intervention Provide education and support for participant on nutrition & aerobic/resistive exercise along with prescribed medications to achieve LDL 70mg , HDL >40mg .   Expected Outcomes Short Term: Participant states understanding of desired cholesterol values and is compliant with medications prescribed. Participant is following exercise prescription and nutrition guidelines.;Long Term: Cholesterol controlled with medications as prescribed, with individualized exercise RX and with personalized nutrition plan. Value goals: LDL < 70mg , HDL > 40 mg.       Personal Goals Discharge:     Goals and Risk Factor Review - 05/11/16 1142    Core Components/Risk Factors/Patient Goals Review   Personal Goals Review Weight Management/Obesity;Sedentary;Increase Strength and Stamina;Hypertension   Review Varish is coming to the program consistently and getting some exercise walking around at home.  He is feeling stronger, but has noticed that his blood pressures have been running lower.  He has had some blurry vision and plans to talk to his doctor about this.  His blood pressures here have been in the normal range (  140-120/60-78).  He was encouraged to drink more water.    Expected Outcomes We will continue to monitor his blood pressure in program and encourage exercise and diet changes for improved lifestyle habits.      Nutrition & Weight - Outcomes:     Pre Biometrics - 05/01/16 1541    Pre Biometrics   Height 5\' 11"  (1.803 m)   Weight 293 lb (132.904 kg)   Waist Circumference 49.5 inches   Hip Circumference 52 inches   Waist to Hip Ratio 0.95 %   BMI (Calculated) 41       Nutrition:   Nutrition Discharge:   Education Questionnaire Score:     Knowledge Questionnaire Score - 05/01/16 1533    Knowledge Questionnaire Score   Pre Score 24/28      Goals reviewed with patient; copy given to patient.

## 2016-05-11 NOTE — Progress Notes (Signed)
Daily Session Note  Patient Details  Name: Bobby Tanner MRN: [id number] Date of Birth: [birth year] Referring Provider:        Cardiac Rehab from 05/04/2016 in [facility]   Referring Provider  Emily Filbert MD      Encounter Date: 05/11/2016  Check In:     Session Check In - 05/11/16 1014    Check-In   Location [facility]-Cardiac & Pulmonary Rehab   Staff Present Heath Lark, RN, BSN, CCRP;Deonte Otting [place], MA, ACSM RCEP, Exercise Physiologist;Amanda Oletta Darter, BA, ACSM CEP, Exercise Physiologist   Supervising physician immediately available to respond to emergencies See telemetry face sheet for immediately available ER MD   Medication changes reported     No   Fall or balance concerns reported    No   Warm-up and Cool-down Performed on first and last piece of equipment   Resistance Training Performed No   VAD Patient? No   Pain Assessment   Currently in Pain? No/denies   Multiple Pain Sites No         Goals Met:  Independence with exercise equipment Exercise tolerated well No report of cardiac concerns or symptoms  Goals Unmet:  Not Applicable  Comments: Pt able to follow exercise prescription today without complaint.  Will continue to monitor for progression.    Dr. Emily Filbert is Medical Director for [facility].

## 2016-05-12 ENCOUNTER — Telehealth: Payer: Self-pay | Admitting: Cardiovascular Disease

## 2016-05-12 NOTE — Telephone Encounter (Signed)
Spoke w/ pt.  He reports lethargy, has no energy or motivation. He is napping everyday for 2-3 hours; affecting his daily activities.  Reports blurry, tunnel vision approx 30 mins after taking his meds, happens am & pm.  BP previously used to run 140s/70s, but recently has been 100s/50s, HR 59-65 at rest, 75 after cardiac rehab.  Micardis was decreased to 20 mg QHS at last ov 04/18/16, pt continues carvedilol 3.125 mg BID.  He states that he discussed w/ Dr. Rockey Situ at time these same sx and that his meds may need further adjustment.  He would like to know if he can further reduce his micardis or if he needs to stop the carvedilol.  Advised him that I will make Dr. Rockey Situ aware of his concerns and call back w/ his recommendation.

## 2016-05-12 NOTE — Telephone Encounter (Signed)
Pt states he thinks he needs an adjustment in his medication. States he is lethargic, and no energy most of the time. Please call.

## 2016-05-12 NOTE — Telephone Encounter (Signed)
He could try to decrease Micardis down to 10 mg daily (uncertain if he can cut the pill down that low without a new prescription).  If he continues to have symptoms, would decrease carvedilol down to 3.125 mg daily

## 2016-05-15 MED ORDER — TELMISARTAN 20 MG PO TABS: 10.0000 mg | ORAL_TABLET | Freq: Every day | ORAL | Status: DC

## 2016-05-15 NOTE — Telephone Encounter (Signed)
Spoke w/ pt.  Advised him of Dr. Donivan Scull recommendation. He requests a new rx to be sent in, as he has been cutting micardis 40 mg tabs in 1/2 and cannot cut them any smaller. Asked him to call back next week if his sx do not improve.

## 2016-05-16 ENCOUNTER — Encounter: Payer: BLUE CROSS/BLUE SHIELD | Admitting: *Deleted

## 2016-05-16 DIAGNOSIS — Z9861 Coronary angioplasty status: Secondary | ICD-10-CM

## 2016-05-16 DIAGNOSIS — I213 ST elevation (STEMI) myocardial infarction of unspecified site: Secondary | ICD-10-CM | POA: Diagnosis not present

## 2016-05-16 NOTE — Progress Notes (Signed)
Discharge Summary  Patient Details  Name: Bobby Tanner MRN: [id number] Date of Birth: August 25, 1951 Referring Provider:        Cardiac Rehab from 05/11/2016 in [facility]   Referring Provider  Kathlyn Sacramento MD       Number of Visits: 5  Reason for Discharge:  Early Exit:  Insurance  Smoking History:  History  Smoking status  . Never Smoker   Smokeless tobacco  . Not on file    Comment: no smokers in  home    Diagnosis:  ST elevation myocardial infarction (STEMI), unspecified artery (HCC)  S/P PTCA (percutaneous transluminal coronary angioplasty)  ADL UCSD:   Initial Exercise Prescription:     Initial Exercise Prescription - 05/11/16 1100    Date of Initial Exercise RX and Referring Provider   Referring Provider Kathlyn Sacramento MD      Discharge Exercise Prescription (Final Exercise Prescription Changes):     Exercise Prescription Changes - 05/11/16 1100    Exercise Review   Progression Yes   Response to Exercise   Blood Pressure (Admit) 104/62 mmHg   Blood Pressure (Exercise) 126/64 mmHg   Blood Pressure (Exit) 124/60 mmHg   Heart Rate (Admit) 59 bpm   Heart Rate (Exercise) 104 bpm   Heart Rate (Exit) 75 bpm   Oxygen Saturation (Exercise) 97 %   Rating of Perceived Exertion (Exercise) 14   Symptoms shortness of breath in general  most likely related to his Birlinta   Duration Progress to 30 minutes of continuous aerobic without signs/symptoms of physical distress   Intensity THRR unchanged   Progression   Progression Continue to progress workloads to maintain intensity without signs/symptoms of physical distress.   Average METs 3.6   Resistance Training   Training Prescription Yes   Weight 3   Reps 10-15   Interval Training   Interval Training No   Treadmill   MPH 3.3   Grade 0.5   Minutes 15   Recumbant Bike   Level 7   Watts 57   Minutes 15   REL-XR   Level 4   Watts 92   Minutes 15   Biostep-RELP   Level 6    Watts 63   Minutes 15   METs 2.5      Functional Capacity:     6 Minute Walk      05/01/16 1534       6 Minute Walk   Phase Initial     Distance 1725 feet     Walk Time 6 minutes     # of Rest Breaks 0     MPH 3.27     METS 3.5     RPE 12     Symptoms No     Resting HR 57 bpm     Resting BP 120/70 mmHg     Max Ex. HR 102 bpm     Max Ex. BP 158/72 mmHg        Psychological, QOL, Others - Outcomes: PHQ 2/9: Depression screen PHQ 2/9 05/01/2016  Decreased Interest 1  Down, Depressed, Hopeless 0  PHQ - 2 Score 1  Altered sleeping 2  Tired, decreased energy 1  Change in appetite 0  Feeling bad or failure about yourself  0  Trouble concentrating 0  Suicidal thoughts 0  PHQ-9 Score 4  Difficult doing work/chores Not difficult at all    Quality of Life:     Quality of Life - 05/01/16 1850  Quality of Life Scores   Health/Function Pre 22 %   Socioeconomic Pre 25.07 %   Psych/Spiritual Pre 22.14 %   Family Pre 27.6 %   GLOBAL Pre 23.49 %      Personal Goals: Goals established at orientation with interventions provided to work toward goal.     Personal Goals and Risk Factors at Admission - 05/01/16 1539    Core Components/Risk Factors/Patient Goals on Admission    Weight Management Yes;Obesity;Weight Loss   Intervention Weight Management/Obesity: Establish reasonable short term and long term weight goals.;Obesity: Provide education and appropriate resources to help participant work on and attain dietary goals.;Weight Management: Develop a combined nutrition and exercise program designed to reach desired caloric intake, while maintaining appropriate intake of nutrient and fiber, sodium and fats, and appropriate energy expenditure required for the weight goal.;Weight Management: Provide education and appropriate resources to help participant work on and attain dietary goals.   Admit Weight 293 lb 11.2 oz (133.221 kg)   Goal Weight: Short Term 250 lb (113.399 kg)   by the end of the year.     Goal Weight: Long Term 200 lb (90.719 kg)   Expected Outcomes Short Term: Continue to assess and modify interventions until short term weight is achieved;Long Term: Adherence to nutrition and physical activity/exercise program aimed toward attainment of established weight goal;Weight Loss: Understanding of general recommendations for a balanced deficit meal plan, which promotes 1-2 lb weight loss per week and includes a negative energy balance of [phone] kcal/d;Understanding recommendations for meals to include 15-35% energy as protein, 25-35% energy from fat, 35-60% energy from carbohydrates, less than 200mg  of dietary cholesterol, 20-35 gm of total fiber daily;Understanding of distribution of calorie intake throughout the day with the consumption of 4-5 meals/snacks   Sedentary Yes   Intervention Provide advice, education, support and counseling about physical activity/exercise needs.;Develop an individualized exercise prescription for aerobic and resistive training based on initial evaluation findings, risk stratification, comorbidities and participant's personal goals.   Expected Outcomes Achievement of increased cardiorespiratory fitness and enhanced flexibility, muscular endurance and strength shown through measurements of functional capacity and personal statement of participant.   Increase Strength and Stamina Yes   Intervention Provide advice, education, support and counseling about physical activity/exercise needs.;Develop an individualized exercise prescription for aerobic and resistive training based on initial evaluation findings, risk stratification, comorbidities and participant's personal goals.   Expected Outcomes Achievement of increased cardiorespiratory fitness and enhanced flexibility, muscular endurance and strength shown through measurements of functional capacity and personal statement of participant.   Hypertension Yes   Intervention Provide education  on lifestyle modifcations including regular physical activity/exercise, weight management, moderate sodium restriction and increased consumption of fresh fruit, vegetables, and low fat dairy, alcohol moderation, and smoking cessation.;Monitor prescription use compliance.   Expected Outcomes Short Term: Continued assessment and intervention until BP is < 140/89mm HG in hypertensive participants. < 130/5mm HG in hypertensive participants with diabetes, heart failure or chronic kidney disease.;Long Term: Maintenance of blood pressure at goal levels.   Lipids Yes   Intervention Provide education and support for participant on nutrition & aerobic/resistive exercise along with prescribed medications to achieve LDL 70mg , HDL >40mg .   Expected Outcomes Short Term: Participant states understanding of desired cholesterol values and is compliant with medications prescribed. Participant is following exercise prescription and nutrition guidelines.;Long Term: Cholesterol controlled with medications as prescribed, with individualized exercise RX and with personalized nutrition plan. Value goals: LDL < 70mg , HDL > 40 mg.  Personal Goals Discharge:     Goals and Risk Factor Review      05/09/16 0904 05/09/16 0905 05/09/16 0906 05/11/16 1142     Core Components/Risk Factors/Patient Goals Review   Personal Goals Review Weight Management/Obesity;Sedentary;Lipids;Increase Strength and Stamina Hypertension;Lipids Weight Management/Obesity;Sedentary;Lipids;Hypertension Weight Management/Obesity;Sedentary;Increase Strength and Stamina;Hypertension    Review   Jeneen Rinks reports he is able to walk around the block daily. He said he is still not smoking since February 14, 2016!! He 's blood pressure is better at home and is good here also. He said his stress has decreased since his wife 's infection after her shoulder surgery is ok now and she is getting physical therapy across the street (set up for the 2 days that he is here  in Cardiac Rehab). Janiel is coming to the program consistently and getting some exercise walking around at home.  He is feeling stronger, but has noticed that his blood pressures have been running lower.  He has had some blurry vision and plans to talk to his doctor about this.  His blood pressures here have been in the normal range (140-120/60-78).  He was encouraged to drink more water.     Expected Outcomes   Cont to improve with Cardiac Rehab.  We will continue to monitor his blood pressure in program and encourage exercise and diet changes for improved lifestyle habits.       Nutrition & Weight - Outcomes:     Pre Biometrics - 05/01/16 1541    Pre Biometrics   Height 5\' 11"  (1.803 m)   Weight 293 lb (132.904 kg)   Waist Circumference 49.5 inches   Hip Circumference 52 inches   Waist to Hip Ratio 0.95 %   BMI (Calculated) 41       Nutrition:   Nutrition Discharge:   Education Questionnaire Score:     Knowledge Questionnaire Score - 05/01/16 1533    Knowledge Questionnaire Score   Pre Score 24/28      Goals reviewed with patient; copy given to patient.

## 2016-05-16 NOTE — Progress Notes (Signed)
Cardiac Individual Treatment Plan  Patient Details  Name: Bobby Tanner MRN: [id number] Date of Birth: [birth year] Referring Provider:        Cardiac Rehab from 05/11/2016 in [facility] Cardiac and Pulmonary Rehab   Referring Provider  Kathlyn Sacramento MD      Initial Encounter Date:       Cardiac Rehab from 05/11/2016 in [facility] Cardiac and Pulmonary Rehab   Referring Provider  Kathlyn Sacramento MD      Visit Diagnosis: ST elevation myocardial infarction (STEMI), unspecified artery ([place])  S/P PTCA (percutaneous transluminal coronary angioplasty)  Patient's Home Medications on Admission:  Current outpatient prescriptions:  .  aspirin EC 81 MG tablet, Take 81 mg by mouth daily., Disp: , Rfl:  .  atorvastatin (LIPITOR) 80 MG tablet, Take 1 tablet (80 mg total) by mouth daily at 6 PM., Disp: 30 tablet, Rfl: 11 .  BRILINTA 90 MG TABS tablet, TAKE ONE TABLET BY MOUTH TWICE DAILY, Disp: 60 tablet, Rfl: 3 .  carvedilol (COREG) 3.125 MG tablet, Take 1 tablet (3.125 mg total) by mouth 2 (two) times daily with a meal., Disp: 60 tablet, Rfl: 11 .  Difluprednate 0.05 % EMUL, Apply 1 drop to eye 2 (two) times daily. Reported on 05/01/2016, Disp: , Rfl:  .  fluticasone (FLONASE) 50 MCG/ACT nasal spray, Place 2 sprays into both nostrils daily. Reported on 05/01/2016, Disp: , Rfl:  .  furosemide (LASIX) 20 MG tablet, Take 1 tablet (20 mg total) by mouth daily as needed., Disp: 30 tablet, Rfl: 6 .  lansoprazole (PREVACID) 30 MG capsule, Take 30 mg by mouth daily., Disp: , Rfl:  .  nepafenac (NEVANAC) 0.1 % ophthalmic suspension, Place 1 drop into the right eye at bedtime. Reported on 05/01/2016, Disp: , Rfl:  .  nitroGLYCERIN (NITROSTAT) 0.4 MG SL tablet, Place 1 tablet (0.4 mg total) under the tongue every 5 (five) minutes x 3 doses as needed for chest pain., Disp: 25 tablet, Rfl: 12 .  potassium chloride (K-DUR) 10 MEQ tablet, Take 1 tablet (10 mEq total) by mouth daily as needed., Disp: 30 tablet, Rfl: 6 .   telmisartan (MICARDIS) 20 MG tablet, Take 0.5 tablets (10 mg total) by mouth daily., Disp: 30 tablet, Rfl: 6  Past Medical History: Past Medical History  Diagnosis Date  . Obstructive sleep apnea     a. Compliant w/ CPAP.  Marland Kitchen Essential hypertension   . GERD (gastroesophageal reflux disease)   . Diet-controlled diabetes mellitus ([place])   . HOH (hard of hearing)   . Skin cancer     a. Basal cell carcinoma of neck s/p resection.  . Hyperlipidemia   . Anginal pain ([place])     a. Reports nl cardiac w/u ~ 2014.  . Orthopnea     a. Improved with CPAP.  Marland Kitchen Asthma     a. had as a child. pollen could aggrevate but does not use inhalers  . Arthritis     a. Hands  . Morbid Tanner ([place])   . Hemorrhoids   . Cataract     a. 03/2016 s/p R cataract extraction and IOL placement.  Marland Kitchen CAD (coronary artery disease)     a. anterior wall ST elevation MI 2/77/412 complicated by multiple VT arrests requiring multiple defibs; b. cardiac cath 04/10/16: pLAD 99% s/p aspiration thrombectomy and PCI/DES 0% residual stenosis, D1 50%, mod to sev elevated LVEDP, mildly reduced LVSF  . ST elevation myocardial infarction (STEMI) of anterior wall ([place]) 04/10/2016  . Ischemic  cardiomyopathy     a. echo 04/10/16: EF 50-55%, AK of apical wall, HK of anteroapical wall, LV dias fxn nl, mild MR, RV sys fxn nl, PASP nl  . Cardiac arrest with ventricular fibrillation ([place]) 04/10/2016    a. VT/VF arrest x 2 in setting of anterior ST elevation MI requiring multiple defibs, chest compressions, amio, epi    Tobacco Use: History  Smoking status  . Never Smoker   Smokeless tobacco  . Not on file    Comment: no smokers in  home    Labs: Recent Review Flowsheet Data    Labs for ITP Cardiac and Pulmonary Rehab Latest Ref Rng 04/11/2016   Cholestrol 0 - 200 mg/dL 189   LDLCALC 0 - 99 mg/dL 132(H)   HDL >40 mg/dL 37(L)   Trlycerides <150 mg/dL 100   Hemoglobin A1c 4.0 - 6.0 % 6.3(H)       Exercise Target Goals:    Exercise  Program Goal: Individual exercise prescription set with THRR, safety & activity barriers. Participant demonstrates ability to understand and report RPE using BORG scale, to self-measure pulse accurately, and to acknowledge the importance of the exercise prescription.  Exercise Prescription Goal: Starting with aerobic activity 30 plus minutes a day, 3 days per week for initial exercise prescription. Provide home exercise prescription and guidelines that participant acknowledges understanding prior to discharge.  Activity Barriers & Risk Stratification:     Activity Barriers & Cardiac Risk Stratification - 05/01/16 1528    Activity Barriers & Cardiac Risk Stratification   Activity Barriers Arthritis;Joint Problems;Neck/Spine Problems;Deconditioning;Shortness of Breath;Other (comment)   Comments right calf problems - used to be a runner and right calf muscle started tightening up and causing pain.  Had to stop pain.  Arthritis of hand mostly.     Cardiac Risk Stratification High      6 Minute Walk:     6 Minute Walk      05/01/16 1534       6 Minute Walk   Phase Initial     Distance 1725 feet     Walk Time 6 minutes     # of Rest Breaks 0     MPH 3.27     METS 3.5     RPE 12     Symptoms No     Resting HR 57 bpm     Resting BP 120/70 mmHg     Max Ex. HR 102 bpm     Max Ex. BP 158/72 mmHg        Initial Exercise Prescription:     Initial Exercise Prescription - 05/11/16 1100    Date of Initial Exercise RX and Referring Provider   Referring Provider Kathlyn Sacramento MD      Perform Capillary Blood Glucose checks as needed.  Exercise Prescription Changes:     Exercise Prescription Changes      05/11/16 1100           Exercise Review   Progression Yes       Response to Exercise   Blood Pressure (Admit) 104/62 mmHg       Blood Pressure (Exercise) 126/64 mmHg       Blood Pressure (Exit) 124/60 mmHg       Heart Rate (Admit) 59 bpm       Heart Rate (Exercise)  104 bpm       Heart Rate (Exit) 75 bpm       Oxygen Saturation (Exercise) 97 %  Rating of Perceived Exertion (Exercise) 14       Symptoms shortness of breath in general  most likely related to his Birlinta       Duration Progress to 30 minutes of continuous aerobic without signs/symptoms of physical distress       Intensity THRR unchanged       Progression   Progression Continue to progress workloads to maintain intensity without signs/symptoms of physical distress.       Average METs 3.6       Resistance Training   Training Prescription Yes       Weight 3       Reps 10-15       Interval Training   Interval Training No       Treadmill   MPH 3.3       Grade 0.5       Minutes 15       Recumbant Bike   Level 7       Watts 57       Minutes 15       REL-XR   Level 4       Watts 92       Minutes 15       Biostep-RELP   Level 6       Watts 63       Minutes 15       METs 2.5          Exercise Comments:     Exercise Comments      05/04/16 0825 05/11/16 1134 05/16/16 0844       Exercise Comments First full day of exercise!  He did great for his first day.  Patient was oriented to gym and equipment including functions, settings, policies, and procedures.  Patient's individual exercise prescription and treatment plan were reviewed.  All starting workloads were established based on the results of the 6 minute walk test done at initial orientation visit.  The plan for exercise progression was also introduced and progression will be customized based on patient's performance and goals. Bobby Tanner has done great with exercise.  He has made good progress and is starting to push himself more.  He is planning to graduate next week due to insurance.  We will review a home exercise plan with him to continue to exercise. Bobby Tanner today from cardiac rehab with  5  sessions completed.  Details were given to Bobby Tanner of the patient's exercise prescription and what he needs to do in order to  continue the prescription and progress.  Patient verbalized understanding.  Bobby Tanner plans to continue to exercise at home.        Discharge Exercise Prescription (Final Exercise Prescription Changes):     Exercise Prescription Changes - 05/11/16 1100    Exercise Review   Progression Yes   Response to Exercise   Blood Pressure (Admit) 104/62 mmHg   Blood Pressure (Exercise) 126/64 mmHg   Blood Pressure (Exit) 124/60 mmHg   Heart Rate (Admit) 59 bpm   Heart Rate (Exercise) 104 bpm   Heart Rate (Exit) 75 bpm   Oxygen Saturation (Exercise) 97 %   Rating of Perceived Exertion (Exercise) 14   Symptoms shortness of breath in general  most likely related to his Birlinta   Duration Progress to 30 minutes of continuous aerobic without signs/symptoms of physical distress   Intensity THRR unchanged   Progression   Progression Continue to progress workloads to maintain intensity without signs/symptoms of physical distress.  Average METs 3.6   Resistance Training   Training Prescription Yes   Weight 3   Reps 10-15   Interval Training   Interval Training No   Treadmill   MPH 3.3   Grade 0.5   Minutes 15   Recumbant Bike   Level 7   Watts 57   Minutes 15   REL-XR   Level 4   Watts 92   Minutes 15   Biostep-RELP   Level 6   Watts 63   Minutes 15   METs 2.5      Nutrition:  Target Goals: Understanding of nutrition guidelines, daily intake of sodium <154m, cholesterol <2036m calories 30% from fat and 7% or less from saturated fats, daily to have 5 or more servings of fruits and vegetables.  Biometrics:     Pre Biometrics - 05/01/16 1541    Pre Biometrics   Height '5\' 11"'  (1.803 m)   Weight 293 lb (132.904 kg)   Waist Circumference 49.5 inches   Hip Circumference 52 inches   Waist to Hip Ratio 0.95 %   BMI (Calculated) 41       Nutrition Therapy Plan and Nutrition Goals:   Nutrition Discharge: Rate Your Plate Scores:   Nutrition Goals  Re-Evaluation:   Psychosocial: Target Goals: Acknowledge presence or absence of depression, maximize coping skills, provide positive support system. Participant is able to verbalize types and ability to use techniques and skills needed for reducing stress and depression.  Initial Review & Psychosocial Screening:     Initial Psych Review & Screening - 05/01/16 [place]   Comments Wife, DaCarlyon Shadows very supportive along with 3 children, 3 grandchildren and 1 great grandchild.  [facility]    Barriers   Psychosocial barriers to participate in program There are no identifiable barriers or psychosocial needs.   Screening Interventions   Interventions Encouraged to exercise;Program counselor consult      Quality of Life Scores:     Quality of Life - 05/01/16 1850    Quality of Life Scores   Health/Function Pre 22 %   Socioeconomic Pre 25.07 %   Psych/Spiritual Pre 22.14 %   Family Pre 27.6 %   GLOBAL Pre 23.49 %      PHQ-9:     Recent Review Flowsheet Data    [facility] 05/01/2016   Decreased Interest 1   Down, Depressed, Hopeless 0   PHQ - 2 Score 1   Altered sleeping 2   Tired, decreased energy 1   Change in appetite 0   Feeling bad or failure about yourself  0   Trouble concentrating 0   Suicidal thoughts 0   PHQ-9 Score 4   Difficult doing work/chores Not difficult at all      Psychosocial Evaluation and Intervention:     Psychosocial Evaluation - 05/09/16 0933    Psychosocial Evaluation & Interventions   Interventions Encouraged to exercise with the program and follow exercise prescription;Stress management education   Comments Counselor met with Bobby Tanner for initial psychosocial evaluation.  He is a [id number] old gentleman who had a heart attack less than a month ago.  He has a strong suport system with a spouse of [phone], an adult daughter who lives  close by and he is an active participant in his local church.  Bobby Tanner and has  goals to lose weight while in this program and over the next year.  He plans to meet with the dietician on Thursday. Bobby Tanner states he sleeps well and has a good appetite.  He denies a history of depression or anxiety or current symptoms.  He states he is typically in a positive mood most of the time.  Bobby Tanner admits to job related stressors with lack of energy and motivation to get the job done in time.  He has goals to increase his stamina and strength and develop a healthier lifestyle in general with healthy eating as well.  Bobby Tanner has a [employer] membership that he intend to use following this program to maintain consistency in his exercise routine.     Discharge Psychosocial Assessment & Intervention   Comments Bobby Tanner will benefit from psychoeducation in Stress management while in this program.  He also will benefit from meeting with the dietician to address his weight loss goals.        Psychosocial Re-Evaluation:   Vocational Rehabilitation: Provide vocational rehab assistance to qualifying candidates.   Vocational Rehab Evaluation & Intervention:     Vocational Rehab - 05/01/16 1536    Initial Vocational Rehab Evaluation & Intervention   Assessment shows need for Vocational Rehabilitation No      Education: Education Goals: Education classes will be provided on a weekly basis, covering required topics. Participant will state understanding/return demonstration of topics presented.  Learning Barriers/Preferences:     Learning Barriers/Preferences - 05/01/16 1532    Learning Barriers/Preferences   Learning Barriers Hearing;Sight   Learning Preferences Individual Instruction;Video      Education Topics: General Nutrition Guidelines/Fats and Fiber: -Group instruction provided by verbal, written material, models and posters to present the general guidelines for heart healthy  nutrition. Gives an explanation and review of dietary fats and fiber.   Controlling Sodium/Reading Food Labels: -Group verbal and written material supporting the discussion of sodium use in heart healthy nutrition. Review and explanation with models, verbal and written materials for utilization of the food label.          Cardiac Rehab from 05/16/2016 in [facility] Cardiac and Pulmonary Rehab   Date  05/04/16   Educator  Jaclyn Shaggy   Instruction Review Code  2- meets goals/outcomes      Exercise Physiology & Risk Factors: - Group verbal and written instruction with models to review the exercise physiology of the cardiovascular system and associated critical values. Details cardiovascular disease risk factors and the goals associated with each risk factor.      Cardiac Rehab from 05/16/2016 in [facility] Cardiac and Pulmonary Rehab   Date  05/11/16   Educator  [facility]   Instruction Review Code  2- meets goals/outcomes      Aerobic Exercise & Resistance Training: - Gives group verbal and written discussion on the health impact of inactivity. On the components of aerobic and resistive training programs and the benefits of this training and how to safely progress through these programs.      Cardiac Rehab from 05/16/2016 in [facility] Cardiac and Pulmonary Rehab   Date  05/16/16   Educator  [place]   Instruction Review Code  2- meets goals/outcomes      Flexibility, Balance, General Exercise Guidelines: - Provides group verbal and written instruction on the benefits of flexibility and balance training programs. Provides general exercise guidelines with specific guidelines to those with heart or lung disease. Demonstration and skill practice provided.   Stress Management: -  Provides group verbal and written instruction about the health risks of elevated stress, cause of high stress, and healthy ways to reduce stress.   Depression: - Provides group verbal and written instruction on the correlation between  heart/lung disease and depressed mood, treatment options, and the stigmas associated with seeking treatment.   Anatomy & Physiology of the Heart: - Group verbal and written instruction and models provide basic cardiac anatomy and physiology, with the coronary electrical and arterial systems. Review of: AMI, Angina, Valve disease, Heart Failure, Cardiac Arrhythmia, Pacemakers, and the ICD.   Cardiac Procedures: - Group verbal and written instruction and models to describe the testing methods done to diagnose heart disease. Reviews the outcomes of the test results. Describes the treatment choices: Medical Management, Angioplasty, or Coronary Bypass Surgery.   Cardiac Medications: - Group verbal and written instruction to review commonly prescribed medications for heart disease. Reviews the medication, class of the drug, and side effects. Includes the steps to properly store meds and maintain the prescription regimen.   Go Sex-Intimacy & Heart Disease, Get SMART - Goal Setting: - Group verbal and written instruction through game format to discuss heart disease and the return to sexual intimacy. Provides group verbal and written material to discuss and apply goal setting through the application of the S.M.A.R.T. Method.   Other Matters of the Heart: - Provides group verbal, written materials and models to describe Heart Failure, Angina, Valve Disease, and Diabetes in the realm of heart disease. Includes description of the disease process and treatment options available to the cardiac patient.   Exercise & Equipment Safety: - Individual verbal instruction and demonstration of equipment use and safety with use of the equipment.      Cardiac Rehab from 05/16/2016 in [facility] Cardiac and Pulmonary Rehab   Date  05/01/16   Educator  [place]   Instruction Review Code  1- partially meets, needs review/practice      Infection Prevention: - Provides verbal and written material to individual with discussion  of infection control including proper hand washing and proper equipment cleaning during exercise session.      Cardiac Rehab from 05/16/2016 in [facility] Cardiac and Pulmonary Rehab   Date  05/01/16   Educator  [place]   Instruction Review Code  2- meets goals/outcomes      Falls Prevention: - Provides verbal and written material to individual with discussion of falls prevention and safety.      Cardiac Rehab from 05/16/2016 in [facility] Cardiac and Pulmonary Rehab   Date  05/01/16   Educator  [place]   Instruction Review Code  2- meets goals/outcomes      Diabetes: - Individual verbal and written instruction to review signs/symptoms of diabetes, desired ranges of glucose level fasting, after meals and with exercise. Advice that pre and post exercise glucose checks will be done for 3 sessions at entry of program.    Knowledge Questionnaire Score:     Knowledge Questionnaire Score - 05/01/16 1533    Knowledge Questionnaire Score   Pre Score 24/28      Core Components/Risk Factors/Patient Goals at Admission:     Personal Goals and Risk Factors at Admission - 05/01/16 1539    Core Components/Risk Factors/Patient Goals on Admission    Weight Management Yes;Tanner;Weight Loss   Intervention Weight Management/Tanner: Establish reasonable short term and long term weight goals.;Tanner: Provide education and appropriate resources to help participant work on and attain dietary goals.;Weight Management: Develop a combined nutrition and exercise program designed  to reach desired caloric intake, while maintaining appropriate intake of nutrient and fiber, sodium and fats, and appropriate energy expenditure required for the weight goal.;Weight Management: Provide education and appropriate resources to help participant work on and attain dietary goals.   Admit Weight 293 lb 11.2 oz (133.221 kg)   Goal Weight: Short Term 250 lb (113.399 kg)  by the end of the year.     Goal Weight: Long Term 200 lb (90.719 kg)    Expected Outcomes Short Term: Continue to assess and modify interventions until short term weight is achieved;Long Term: Adherence to nutrition and physical activity/exercise program aimed toward attainment of established weight goal;Weight Loss: Understanding of general recommendations for a balanced deficit meal plan, which promotes 1-2 lb weight loss per week and includes a negative energy balance of [phone] kcal/d;Understanding recommendations for meals to include 15-35% energy as protein, 25-35% energy from fat, 35-60% energy from carbohydrates, less than 273m of dietary cholesterol, 20-35 gm of total fiber daily;Understanding of distribution of calorie intake throughout the day with the consumption of 4-5 meals/snacks   Sedentary Yes   Intervention Provide advice, education, support and counseling about physical activity/exercise needs.;Develop an individualized exercise prescription for aerobic and resistive training based on initial evaluation findings, risk stratification, comorbidities and participant's personal goals.   Expected Outcomes Achievement of increased cardiorespiratory fitness and enhanced flexibility, muscular endurance and strength shown through measurements of functional capacity and personal statement of participant.   Increase Strength and Stamina Yes   Intervention Provide advice, education, support and counseling about physical activity/exercise needs.;Develop an individualized exercise prescription for aerobic and resistive training based on initial evaluation findings, risk stratification, comorbidities and participant's personal goals.   Expected Outcomes Achievement of increased cardiorespiratory fitness and enhanced flexibility, muscular endurance and strength shown through measurements of functional capacity and personal statement of participant.   Hypertension Yes   Intervention Provide education on lifestyle modifcations including regular physical activity/exercise,  weight management, moderate sodium restriction and increased consumption of fresh fruit, vegetables, and low fat dairy, alcohol moderation, and smoking cessation.;Monitor prescription use compliance.   Expected Outcomes Short Term: Continued assessment and intervention until BP is < 140/944mHG in hypertensive participants. < 130/8019mG in hypertensive participants with diabetes, heart failure or chronic kidney disease.;Long Term: Maintenance of blood pressure at goal levels.   Lipids Yes   Intervention Provide education and support for participant on nutrition & aerobic/resistive exercise along with prescribed medications to achieve LDL <47m33mDL >40mg10mExpected Outcomes Short Term: Participant states understanding of desired cholesterol values and is compliant with medications prescribed. Participant is following exercise prescription and nutrition guidelines.;Long Term: Cholesterol controlled with medications as prescribed, with individualized exercise RX and with personalized nutrition plan. Value goals: LDL < 47mg,54m > 40 mg.      Core Components/Risk Factors/Patient Goals Review:      Goals and Risk Factor Review      05/09/16 0904 05/09/16 0905 05/09/16 0906 05/11/16 1142     Core Components/Risk Factors/Patient Goals Review   Personal Goals Review Weight Management/Tanner;Sedentary;Lipids;Increase Strength and Stamina Hypertension;Lipids Weight Management/Tanner;Sedentary;Lipids;Hypertension Weight Management/Tanner;Sedentary;Increase Strength and Stamina;Hypertension    Review   Bobby Tanner he is able to walk around the block daily. He said he is still not smoking since February 14, 2016!! He 's blood pressure is better at home and is good here also. He said his stress has decreased since his wife 's infection after her shoulder surgery is ok now and she  is getting physical therapy across the street (set up for the 2 days that he is here in Cardiac Rehab). Bobby Tanner is coming to the  program consistently and getting some exercise walking around at home.  He is feeling stronger, but has noticed that his blood pressures have been running lower.  He has had some blurry vision and plans to talk to his doctor about this.  His blood pressures here have been in the normal range (140-120/60-78).  He was encouraged to drink more water.     Expected Outcomes   Cont to improve with Cardiac Rehab.  We will continue to monitor his blood pressure in program and encourage exercise and diet changes for improved lifestyle habits.       Core Components/Risk Factors/Patient Goals at Discharge (Final Review):      Goals and Risk Factor Review - 05/11/16 1142    Core Components/Risk Factors/Patient Goals Review   Personal Goals Review Weight Management/Tanner;Sedentary;Increase Strength and Stamina;Hypertension   Review Bobby Tanner is coming to the program consistently and getting some exercise walking around at home.  He is feeling stronger, but has noticed that his blood pressures have been running lower.  He has had some blurry vision and plans to talk to his doctor about this.  His blood pressures here have been in the normal range (140-120/60-78).  He was encouraged to drink more water.    Expected Outcomes We will continue to monitor his blood pressure in program and encourage exercise and diet changes for improved lifestyle habits.      ITP Comments:     ITP Comments      05/09/16 0848 05/11/16 1135 05/16/16 0848       ITP Comments Bobby Tanner has lost 3 lbs in the past 3 days, down 15lbs since his MI Bobby Tanner said he has always had a problem with his weight. He is meeting individually with the Bobby Tanner on Thursdsay. His snacks are now fruit snacks. Blood pressure ojk at 120-150/60-70. He is now able to garden 2 hours/per day sometdays and some walking. We discussed how exercising in here should help his overall health incl weight.  Pryce will be graduating early next  week.  His insurance plan's calendar year restarts in July and he cannot afford to pay his deductible again at this point.  He will continue to exercise on his own at home.  He says that he has learned a lot from the program. Osama has elected to complete the program today secondary to insurance changes.  He has been provided home exercise guidelines and program.         Comments:

## 2016-05-16 NOTE — Patient Instructions (Signed)
car Discharge Instructions  Patient Details  Name: Bobby Tanner MRN: [id number] Date of Birth: 08/30/1951 Referring Provider:  Wellington Hampshire, MD   Number of Visits: 5  Reason for Discharge:  Early Exit:  Insurance  Smoking History:  History  Smoking status  . Never Smoker   Smokeless tobacco  . Not on file    Comment: no smokers in  home    Diagnosis:  ST elevation myocardial infarction (STEMI), unspecified artery (HCC)  S/P PTCA (percutaneous transluminal coronary angioplasty)  Initial Exercise Prescription:     Initial Exercise Prescription - 05/11/16 1100    Date of Initial Exercise RX and Referring Provider   Referring Provider Kathlyn Sacramento MD      Discharge Exercise Prescription (Final Exercise Prescription Changes):     Exercise Prescription Changes - 05/11/16 1100    Exercise Review   Progression Yes   Response to Exercise   Blood Pressure (Admit) 104/62 mmHg   Blood Pressure (Exercise) 126/64 mmHg   Blood Pressure (Exit) 124/60 mmHg   Heart Rate (Admit) 59 bpm   Heart Rate (Exercise) 104 bpm   Heart Rate (Exit) 75 bpm   Oxygen Saturation (Exercise) 97 %   Rating of Perceived Exertion (Exercise) 14   Symptoms shortness of breath in general  most likely related to his [place]   Duration Progress to 30 minutes of continuous aerobic without signs/symptoms of physical distress   Intensity THRR unchanged   Progression   Progression Continue to progress workloads to maintain intensity without signs/symptoms of physical distress.   Average METs 3.6   Resistance Training   Training Prescription Yes   Weight 3   Reps 10-15   Interval Training   Interval Training No   Treadmill   MPH 3.3   Grade 0.5   Minutes 15   Recumbant Bike   Level 7   Watts 57   Minutes 15   REL-XR   Level 4   Watts 92   Minutes 15   Biostep-RELP   Level 6   Watts 63   Minutes 15   METs 2.5      Functional Capacity:     6 Minute Walk      05/01/16  1534       6 Minute Walk   Phase Initial     Distance 1725 feet     Walk Time 6 minutes     # of Rest Breaks 0     MPH 3.27     METS 3.5     RPE 12     Symptoms No     Resting HR 57 bpm     Resting BP 120/70 mmHg     Max Ex. HR 102 bpm     Max Ex. BP 158/72 mmHg        Quality of Life:     Quality of Life - 05/01/16 1850    Quality of Life Scores   Health/Function Pre 22 %   Socioeconomic Pre 25.07 %   Psych/Spiritual Pre 22.14 %   Family Pre 27.6 %   GLOBAL Pre 23.49 %      Personal Goals: Goals established at orientation with interventions provided to work toward goal.     Personal Goals and Risk Factors at Admission - 05/01/16 1539    Core Components/Risk Factors/Patient Goals on Admission    Weight Management Yes;Obesity;Weight Loss   Intervention Weight Management/Obesity: Establish reasonable short term and long term weight goals.;Obesity: Provide  education and appropriate resources to help participant work on and attain dietary goals.;Weight Management: Develop a combined nutrition and exercise program designed to reach desired caloric intake, while maintaining appropriate intake of nutrient and fiber, sodium and fats, and appropriate energy expenditure required for the weight goal.;Weight Management: Provide education and appropriate resources to help participant work on and attain dietary goals.   Admit Weight 293 lb 11.2 oz (133.221 kg)   Goal Weight: Short Term 250 lb (113.399 kg)  by the end of the year.     Goal Weight: Long Term 200 lb (90.719 kg)   Expected Outcomes Short Term: Continue to assess and modify interventions until short term weight is achieved;Long Term: Adherence to nutrition and physical activity/exercise program aimed toward attainment of established weight goal;Weight Loss: Understanding of general recommendations for a balanced deficit meal plan, which promotes 1-2 lb weight loss per week and includes a negative energy balance of [phone]  kcal/d;Understanding recommendations for meals to include 15-35% energy as protein, 25-35% energy from fat, 35-60% energy from carbohydrates, less than 200mg  of dietary cholesterol, 20-35 gm of total fiber daily;Understanding of distribution of calorie intake throughout the day with the consumption of 4-5 meals/snacks   Sedentary Yes   Intervention Provide advice, education, support and counseling about physical activity/exercise needs.;Develop an individualized exercise prescription for aerobic and resistive training based on initial evaluation findings, risk stratification, comorbidities and participant's personal goals.   Expected Outcomes Achievement of increased cardiorespiratory fitness and enhanced flexibility, muscular endurance and strength shown through measurements of functional capacity and personal statement of participant.   Increase Strength and Stamina Yes   Intervention Provide advice, education, support and counseling about physical activity/exercise needs.;Develop an individualized exercise prescription for aerobic and resistive training based on initial evaluation findings, risk stratification, comorbidities and participant's personal goals.   Expected Outcomes Achievement of increased cardiorespiratory fitness and enhanced flexibility, muscular endurance and strength shown through measurements of functional capacity and personal statement of participant.   Hypertension Yes   Intervention Provide education on lifestyle modifcations including regular physical activity/exercise, weight management, moderate sodium restriction and increased consumption of fresh fruit, vegetables, and low fat dairy, alcohol moderation, and smoking cessation.;Monitor prescription use compliance.   Expected Outcomes Short Term: Continued assessment and intervention until BP is < 140/68mm HG in hypertensive participants. < 130/17mm HG in hypertensive participants with diabetes, heart failure or chronic kidney  disease.;Long Term: Maintenance of blood pressure at goal levels.   Lipids Yes   Intervention Provide education and support for participant on nutrition & aerobic/resistive exercise along with prescribed medications to achieve LDL 70mg , HDL >40mg .   Expected Outcomes Short Term: Participant states understanding of desired cholesterol values and is compliant with medications prescribed. Participant is following exercise prescription and nutrition guidelines.;Long Term: Cholesterol controlled with medications as prescribed, with individualized exercise RX and with personalized nutrition plan. Value goals: LDL < 70mg , HDL > 40 mg.       Personal Goals Discharge:     Goals and Risk Factor Review - 05/11/16 1142    Core Components/Risk Factors/Patient Goals Review   Personal Goals Review Weight Management/Obesity;Sedentary;Increase Strength and Stamina;Hypertension   Review Bobby Tanner is coming to the program consistently and getting some exercise walking around at home.  He is feeling stronger, but has noticed that his blood pressures have been running lower.  He has had some blurry vision and plans to talk to his doctor about this.  His blood pressures here have been in the normal  range (140-120/60-78).  He was encouraged to drink more water.    Expected Outcomes We will continue to monitor his blood pressure in program and encourage exercise and diet changes for improved lifestyle habits.      Nutrition & Weight - Outcomes:     Pre Biometrics - 05/01/16 1541    Pre Biometrics   Height 5\' 11"  (1.803 m)   Weight 293 lb (132.904 kg)   Waist Circumference 49.5 inches   Hip Circumference 52 inches   Waist to Hip Ratio 0.95 %   BMI (Calculated) 41       Nutrition:   Nutrition Discharge:   Education Questionnaire Score:     Knowledge Questionnaire Score - 05/01/16 1533    Knowledge Questionnaire Score   Pre Score 24/28      Goals reviewed with patient; copy given to patient.

## 2016-05-16 NOTE — Progress Notes (Signed)
Daily Session Note  Patient Details  Name: Bobby Tanner MRN: [id number] Date of Birth: July 21, 1951 Referring Provider:        Cardiac Rehab from 05/11/2016 in [facility]   Referring Provider  Kathlyn Sacramento MD      Encounter Date: 05/16/2016  Check In:     Session Check In - 05/16/16 0847    Check-In   Staff Present Heath Lark, RN, BSN, CCRP;Jessica Luan Pulling, MA, ACSM RCEP, Exercise Physiologist;Diane Joya Gaskins, RN, BSN   Supervising physician immediately available to respond to emergencies See telemetry face sheet for immediately available ER MD   Medication changes reported     No   Fall or balance concerns reported    No   Warm-up and Cool-down Performed on first and last piece of equipment   VAD Patient? No   Pain Assessment   Currently in Pain? No/denies         Goals Met:  Independence with exercise equipment Exercise tolerated well No report of cardiac concerns or symptoms Strength training completed today  Goals Unmet:  Not Applicable  Comments: completed program today. Early exit secondary to insurance   Dr. Emily Filbert is Medical Director for [place] and [facility].

## 2016-05-19 ENCOUNTER — Encounter: Payer: Self-pay | Admitting: Dietician

## 2016-05-23 ENCOUNTER — Telehealth: Payer: Self-pay | Admitting: Cardiovascular Disease

## 2016-05-23 NOTE — Telephone Encounter (Signed)
Pt c/o BP issue: STAT if pt c/o blurred vision, one-sided weakness or slurred speech  1. What are your last 5 BP readings? 05/23/16 3 am 91/56 p 56 and 9 am 110/62 p 60, 05/22/16 11am 97/59 p 59  2. Are you having any other symptoms (ex. Dizziness, headache, blurred vision, passed out)? Blurred vision, dizzy bending over, sob, chest pressure  3. What is your BP issue? BP very low and concerned, please call patient.

## 2016-05-23 NOTE — Telephone Encounter (Signed)
Spoke with patient at length regarding his symptoms. He reports low blood pressures with some dizziness and some sensations in his chest that do not last. Reviewed his medications with him, and he has not tried the nitroglycerin. He did report that he is not drinking much water and let him know that it is important to remain hydrated and that could cause some of his symptoms to include the low blood pressure. He is just concerned since he just had a heart attack last month with catheterization. Instructed him to make sure he drinks plenty of water, continue monitoring his blood pressure, and try nitroglycerin for chest pain if needed but be careful because this could also cause a decrease in blood pressure. Also let him know that if chest pain continues and does not subside with nitro to go to nearest emergency room for evaluation. [place] and [place] come in. We did get him scheduled for 05/26/16 at 11:00AM with Dr. Rockey Situ to discuss his symptoms. Patient agreed with plan of care, verbalized understanding of instructions, and had no further questions at this time.

## 2016-05-26 ENCOUNTER — Encounter: Payer: Self-pay | Admitting: Cardiovascular Disease

## 2016-05-26 ENCOUNTER — Ambulatory Visit (INDEPENDENT_AMBULATORY_CARE_PROVIDER_SITE_OTHER): Payer: BLUE CROSS/BLUE SHIELD | Admitting: Cardiovascular Disease

## 2016-05-26 VITALS — BP 144/82 | HR 61 | Ht 72.0 in | Wt 291.2 lb

## 2016-05-26 DIAGNOSIS — R0789 Other chest pain: Secondary | ICD-10-CM

## 2016-05-26 DIAGNOSIS — I10 Essential (primary) hypertension: Secondary | ICD-10-CM

## 2016-05-26 DIAGNOSIS — I4901 Ventricular fibrillation: Secondary | ICD-10-CM | POA: Diagnosis not present

## 2016-05-26 DIAGNOSIS — R42 Dizziness and giddiness: Secondary | ICD-10-CM | POA: Diagnosis not present

## 2016-05-26 DIAGNOSIS — I2102 ST elevation (STEMI) myocardial infarction involving left anterior descending coronary artery: Secondary | ICD-10-CM

## 2016-05-26 DIAGNOSIS — I209 Angina pectoris, unspecified: Secondary | ICD-10-CM

## 2016-05-26 DIAGNOSIS — R0602 Shortness of breath: Secondary | ICD-10-CM

## 2016-05-26 NOTE — Progress Notes (Signed)
Patient ID: Bobby Tanner, male   DOB: 1951/04/25, 65 y.o.   MRN: [id number] Cardiology Office Note  Date:  05/26/2016   ID:  Bobby Tanner, DOB 1951-10-11, MRN [id number]  PCP:  Rusty Aus, MD   Chief Complaint  Patient presents with  . other    Pt. c/o feeling dizzy, decreased BP, shortness of breath and had some chest discomfort. Meds reviewed by the patient verbally.     HPI:  65 year old gentleman with history of Acute anterior ST elevation MI s/p PCI with VF arrest requiring chest compressions 04/10/2016, DES placed to the proximal LAD also with residual 50% diagonal disease discharged on 04/12/2016 with follow-up today for his coronary artery disease, STEMI   On his last clinic visit, blood pressure was low and he had orthostasis We decrease the Benicar down to 20 mg daily. He reports that he continues to have symptoms on follow-up today, fuzziness, foggy in the head, occasional dizziness. He feels it is from one of the medications. Previously had problems on Lipitor Otherwise has been active, recently walked [place] several hours [place] theme park, worked in the garden for 2 hours this morning with a hoe.  He did participate in cardiac rehabilitation, insurance changed and no longer able to afford it Review of lab work shows hemoglobin A1c 6.3 Total cholesterol dramatically improved, LDL down to 50s  EKG shows normal sinus rhythm with rate 61 bpm, no significant ST or T-wave changes  Other past medical history reviewed with him Previous Echo showed EF 50-55%, akinesis of the apical myocardium, hypokinesis of the anteroseptal myocardium, LV diastolic function was normal, mild MR, RV systolic function was normal, PASP was normal.   Cardiac catheterization 04/10/2016: 1st Diag lesion, 50% stenosed. Prox LAD lesion, 99% stenosed. Post intervention, there is a 0% residual stenosis. The lesion was not previously treated.The left ventricular systolic function is normal.  1. Severe  one-vessel coronary artery disease with heavy thrombotic lesion in the proximal LAD. Otherwise no obstructive disease. 2. Mildly reduced LV systolic function with mild anterior wall hypokinesis. Moderately to severely elevated left ventricular end-diastolic pressure. 3. Successful aspiration thrombectomy and drug-eluting stent placement to the proximal LAD.  PMH:   has a past medical history of Obstructive sleep apnea; Essential hypertension; GERD (gastroesophageal reflux disease); Diet-controlled diabetes mellitus ([place]); HOH (hard of hearing); Skin cancer; Hyperlipidemia; Anginal pain ([place]); Orthopnea; Asthma; Arthritis; Morbid obesity ([place]); Hemorrhoids; Cataract; CAD (coronary artery disease); ST elevation myocardial infarction (STEMI) of anterior wall ([place]) (04/10/2016); Ischemic cardiomyopathy; and Cardiac arrest with ventricular fibrillation ([place]) (04/10/2016).  PSH:    Past Surgical History  Procedure Laterality Date  . Skin cancers      head  . Colonoscopy  2014  . Cataract extraction w/phaco Right 04/03/2016    Procedure: CATARACT EXTRACTION PHACO AND INTRAOCULAR LENS PLACEMENT (IOC);  Surgeon: Estill Cotta, MD;  Location: [facility] ORS;  Service: Ophthalmology;  Laterality: Right;  [place] 01:13 AP% 25.8 CDE 34.18 fluid pack lot [id number]  . Cardiac catheterization N/A 04/10/2016    Procedure: Left Heart Cath and Coronary Angiography;  Surgeon: Wellington Hampshire, MD;  Location: [facility];  Service: Cardiovascular;  Laterality: N/A;  . Cardiac catheterization N/A 04/10/2016    Procedure: Coronary Stent Intervention;  Surgeon: Wellington Hampshire, MD;  Location: [facility];  Service: Cardiovascular;  Laterality: N/A;    Current Outpatient Prescriptions  Medication Sig Dispense Refill  . aspirin EC 81 MG tablet Take 81 mg  by mouth daily.    Marland Kitchen atorvastatin (LIPITOR) 80 MG tablet Take 1 tablet (80 mg total) by mouth daily at 6 PM. 30 tablet 11  . BRILINTA 90 MG TABS tablet TAKE  ONE TABLET BY MOUTH TWICE DAILY 60 tablet 3  . carvedilol (COREG) 3.125 MG tablet Take 1 tablet (3.125 mg total) by mouth 2 (two) times daily with a meal. 60 tablet 11  . fluticasone (FLONASE) 50 MCG/ACT nasal spray Place 2 sprays into both nostrils daily. Reported on 05/01/2016    . furosemide (LASIX) 20 MG tablet Take 1 tablet (20 mg total) by mouth daily as needed. 30 tablet 6  . lansoprazole (PREVACID) 30 MG capsule Take 30 mg by mouth daily.    . nitroGLYCERIN (NITROSTAT) 0.4 MG SL tablet Place 1 tablet (0.4 mg total) under the tongue every 5 (five) minutes x 3 doses as needed for chest pain. 25 tablet 12  . potassium chloride (K-DUR) 10 MEQ tablet Take 1 tablet (10 mEq total) by mouth daily as needed. 30 tablet 6  . telmisartan (MICARDIS) 20 MG tablet Take 0.5 tablets (10 mg total) by mouth daily. 30 tablet 6   No current facility-administered medications for this visit.     Allergies:   Review of patient's allergies indicates no known allergies.   Social History:  The patient  reports that he has never smoked. He does not have any smokeless tobacco history on file. He reports that he drinks alcohol. He reports that he does not use illicit drugs.   Family History:   family history includes Alzheimer's disease in his mother; CAD in his father; Hypertension in his brother, father, and paternal grandfather.    Review of Systems: Review of Systems  Constitutional: Negative.   Respiratory: Negative.   Cardiovascular: Negative.   Gastrointestinal: Negative.   Musculoskeletal: Negative.   Neurological: Positive for dizziness.  Psychiatric/Behavioral: Negative.   All other systems reviewed and are negative.    PHYSICAL EXAM: VS:  BP 144/82 mmHg  Pulse 61  Ht 6' (1.829 m)  Wt 291 lb 4 oz (132.11 kg)  BMI 39.49 kg/m2  SpO2 98% , BMI Body mass index is 39.49 kg/(m^2). GEN: Well nourished, well developed, in no acute distress, obese HEENT: normal Neck: no JVD, carotid bruits, or  masses Cardiac: RRR; no murmurs, rubs, or gallops, trace bilateral lower extremity edema to below the knees Respiratory:  clear to auscultation bilaterally, normal work of breathing GI: soft, nontender, nondistended, + BS MS: no deformity or atrophy Skin: warm and dry, no rash Neuro:  Strength and sensation are intact Psych: euthymic mood, full affect    Recent Labs: 04/10/2016: TSH 2.357 04/11/2016: ALT 215*; BUN 20; Creatinine, Ser 1.25*; Hemoglobin 13.8; Platelets 171; Potassium 4.2; Sodium 137    Lipid Panel Lab Results  Component Value Date   CHOL 189 04/11/2016   HDL 37* 04/11/2016   LDLCALC 132* 04/11/2016   TRIG 100 04/11/2016      Wt Readings from Last 3 Encounters:  05/26/16 291 lb 4 oz (132.11 kg)  05/01/16 293 lb (132.904 kg)  04/18/16 296 lb 4 oz (134.378 kg)       ASSESSMENT AND PLAN:  Essential hypertension - Plan: EKG 12-Lead Will decrease Coreg down to 3.125 mg in the evening If he continues to have low blood pressure, we'll hold the carvedilol  ST elevation myocardial infarction involving left anterior descending (LAD) coronary artery ([place]) - Plan: EKG 12-Lead Discussed previous cardiac catheterization results with him  including the 50% diagonal disease Stressed importance of aggressive sugar control,  low cholesterol  Dizziness - Plan: EKG 12-Lead Etiology of his dizziness is unclear, blood pressure well controlled on today's visit He does have blood pressure numbers from home, systolic pressure typically close to 100 We'll decrease carvedilol as above Unable to exclude other side effects such as from the Lipitor. He does report having side effects in the past from Lipitor  SOB (shortness of breath) - Plan: EKG 12-Lead Recommended he take Lasix for any shortness of breath or leg edema  Chest discomfort - Plan: EKG 12-Lead Atypical chest pain at times, presenting at rest, not on exertion No further workup at this time  Disposition:   F/U  6  months   Total encounter time more than 25 minutes  Greater than 50% was spent in counseling and coordination of care with the patient   Orders Placed This Encounter  Procedures  . EKG 12-Lead     Signed, Esmond Plants, M.D., Ph.D. 05/26/2016  [place], [place]

## 2016-05-26 NOTE — Patient Instructions (Signed)
Medication Instructions:   Please decrease coreg down to one pill in the evening If pressure continues to run low and you have symptoms Stop the coreg  If you continue to have symptoms, Call the office You could change atorvastatin to crestor  Lasix for worsening leg swelling    Follow-Up: It was a pleasure seeing you in the office today. Please call us if you have new issues that need to be addressed before your next appt.  [phone]  Your physician wants you to follow-up in: 6 months.  You will receive a reminder letter in the mail two months in advance. If you don't receive a letter, please call our office to schedule the follow-up appointment.  If you need a refill on your cardiac medications before your next appointment, please call your pharmacy.

## 2016-06-13 ENCOUNTER — Encounter: Payer: BLUE CROSS/BLUE SHIELD | Admitting: Physician Assistant

## 2016-07-17 ENCOUNTER — Ambulatory Visit: Payer: BLUE CROSS/BLUE SHIELD | Admitting: Cardiovascular Disease

## 2016-09-08 ENCOUNTER — Other Ambulatory Visit: Payer: Self-pay

## 2016-09-08 MED ORDER — TICAGRELOR 90 MG PO TABS: 90.0000 mg | ORAL_TABLET | Freq: Two times a day (BID) | ORAL | 3 refills | Status: DC

## 2016-09-29 ENCOUNTER — Telehealth: Payer: Self-pay | Admitting: Cardiovascular Disease

## 2016-09-29 NOTE — Telephone Encounter (Signed)
Pt calling stating Monday he is having some dental work Monday He is doing a routine cleaning. May have some bleeding and he is just making sure he is going to be okay.  May leave message on vm if patient does not answer.  Please advise.

## 2016-09-29 NOTE — Telephone Encounter (Signed)
Spoke w/ pt.  Advised him that per our policy & procedures, he does not need SBE prophylaxis for routine dental cleaning. Asked him to let us know if he needs a more invasive procedure. He is appreciative of the call.

## 2016-11-08 ENCOUNTER — Telehealth: Payer: Self-pay | Admitting: Cardiovascular Disease

## 2016-11-08 NOTE — Telephone Encounter (Signed)
Pt calling stating he is having some issues. Having more issues with breathing, blurred vision.  He states this is been going on for a month now. Would like to know what can he do  Pt is also coming 11/28/16 to see Thurmond Butts Please advise.

## 2016-11-08 NOTE — Telephone Encounter (Signed)
Left voicemail message to call back  

## 2016-11-08 NOTE — Telephone Encounter (Signed)
Patient reports some dizziness, lightheadedness, shortness of breath, blurred vision and some pressure in the chest. Patient reports that he has been having this for over a month now. He wants to know if he can be seen sooner and let him know that he had the first available appointment and we will also place him on the waiting list if something should come open sooner. Instructed him to go to nearest emergency room for evaluation with any chest pain or shortness of breath. He verbalized understanding with no further questions at this time.

## 2016-11-13 ENCOUNTER — Emergency Department
Admission: EM | Admit: 2016-11-13 | Discharge: 2016-11-13 | Disposition: A | Discharge status: Home / Self Care | Payer: BLUE CROSS/BLUE SHIELD | Attending: Emergency Medicine | Admitting: Emergency Medicine

## 2016-11-13 ENCOUNTER — Emergency Department: Payer: BLUE CROSS/BLUE SHIELD

## 2016-11-13 ENCOUNTER — Encounter: Payer: Self-pay | Admitting: Emergency Medicine

## 2016-11-13 DIAGNOSIS — R079 Chest pain, unspecified: Secondary | ICD-10-CM

## 2016-11-13 DIAGNOSIS — E119 Type 2 diabetes mellitus without complications: Secondary | ICD-10-CM | POA: Insufficient documentation

## 2016-11-13 DIAGNOSIS — I2510 Atherosclerotic heart disease of native coronary artery without angina pectoris: Secondary | ICD-10-CM | POA: Insufficient documentation

## 2016-11-13 DIAGNOSIS — R0789 Other chest pain: Secondary | ICD-10-CM | POA: Insufficient documentation

## 2016-11-13 DIAGNOSIS — I10 Essential (primary) hypertension: Secondary | ICD-10-CM | POA: Insufficient documentation

## 2016-11-13 DIAGNOSIS — J45909 Unspecified asthma, uncomplicated: Secondary | ICD-10-CM | POA: Insufficient documentation

## 2016-11-13 DIAGNOSIS — Z7982 Long term (current) use of aspirin: Secondary | ICD-10-CM | POA: Diagnosis not present

## 2016-11-13 DIAGNOSIS — Z85828 Personal history of other malignant neoplasm of skin: Secondary | ICD-10-CM | POA: Insufficient documentation

## 2016-11-13 DIAGNOSIS — Z7984 Long term (current) use of oral hypoglycemic drugs: Secondary | ICD-10-CM | POA: Diagnosis not present

## 2016-11-13 DIAGNOSIS — Z79899 Other long term (current) drug therapy: Secondary | ICD-10-CM | POA: Diagnosis not present

## 2016-11-13 LAB — COMPREHENSIVE METABOLIC PANEL
ALBUMIN: 3.8 g/dL (ref 3.5–5.0)
ALT: 23 U/L (ref 17–63)
ANION GAP: 5 (ref 5–15)
AST: 21 U/L (ref 15–41)
Alkaline Phosphatase: 101 U/L (ref 38–126)
BUN: 12 mg/dL (ref 6–20)
CHLORIDE: 107 mmol/L (ref 101–111)
CO2: 25 mmol/L (ref 22–32)
Calcium: 8.9 mg/dL (ref 8.9–10.3)
Creatinine, Ser: 0.99 mg/dL (ref 0.61–1.24)
GFR calc Af Amer: >60 mL/min (ref >60)
GFR calc non Af Amer: >60 mL/min (ref >60)
GLUCOSE: 115 mg/dL — AB (ref 65–99)
POTASSIUM: 3.8 mmol/L (ref 3.5–5.1)
SODIUM: 137 mmol/L (ref 135–145)
TOTAL PROTEIN: 7 g/dL (ref 6.5–8.1)
Total Bilirubin: 0.9 mg/dL (ref 0.3–1.2)

## 2016-11-13 LAB — CBC
HEMATOCRIT: 43.4 % (ref 40.0–52.0)
Hemoglobin: 14.8 g/dL (ref 13.0–18.0)
MCH: 30.1 pg (ref 26.0–34.0)
MCHC: 34.1 g/dL (ref 32.0–36.0)
MCV: 88.4 fL (ref 80.0–100.0)
PLATELETS: 188 10*3/uL (ref 150–440)
RBC: 4.91 MIL/uL (ref 4.40–5.90)
RDW: 13.5 % (ref 11.5–14.5)
WBC: 9.4 10*3/uL (ref 3.8–10.6)

## 2016-11-13 LAB — TROPONIN I: Troponin I: <0.03 ng/mL (ref <0.03)

## 2016-11-13 NOTE — ED Provider Notes (Signed)
[facility] Emergency Department Provider Note  ____________________________________________   First MD Initiated Contact with Patient 11/13/16 1922     (approximate)  I have reviewed the triage vital signs and the nursing notes.   HISTORY  Chief Complaint Chest Pain    HPI Bobby Tanner is a 65 y.o. male with a history of an MI about 7 months ago who presents for evaluation of episodic chest pain over the last month.  Sees Dr. Rockey Situ but has not been about to schedule an appointment recently and wanted to be evaluated for the holidays.  States that the symptoms have been gradually getting worse over the last month but then over the last few days they have actually been better.  He believes that exertion makes his shortness of breath worse and rest makes his symptoms better.  He denies any recent viral symptoms and specifically denies fever/chills, nausea, vomiting, abdominal pain, dysuria.  He describes the symptoms as moderate their worst but they are concerning to him given his recent history of heart attack.He describes the chest discomfort is mostly a pressure but occasionally a dull ache that is mostly in the center of his chest but also at the left side.  Reports that he is compliant with all of his medications including aspirin and Brilinta.   Past Medical History:  Diagnosis Date  . Anginal pain ([place])    a. Reports nl cardiac w/u ~ 2014.  . Arthritis    a. Hands  . Asthma    a. had as a child. pollen could aggrevate but does not use inhalers  . CAD (coronary artery disease)    a. anterior wall ST elevation MI 0000000 complicated by multiple VT arrests requiring multiple defibs; b. cardiac cath 04/10/16: pLAD 99% s/p aspiration thrombectomy and PCI/DES 0% residual stenosis, D1 50%, mod to sev elevated LVEDP, mildly reduced LVSF  . Cardiac arrest with ventricular fibrillation ([place]) 04/10/2016   a. VT/VF arrest x 2 in setting of anterior ST elevation  MI requiring multiple defibs, chest compressions, amio, epi  . Cataract    a. 03/2016 s/p R cataract extraction and IOL placement.  . Diet-controlled diabetes mellitus ([place])   . Essential hypertension   . GERD (gastroesophageal reflux disease)   . Hemorrhoids   . HOH (hard of hearing)   . Hyperlipidemia   . Ischemic cardiomyopathy    a. echo 04/10/16: EF 50-55%, AK of apical wall, HK of anteroapical wall, LV dias fxn nl, mild MR, RV sys fxn nl, PASP nl  . Morbid obesity ([place])   . Obstructive sleep apnea    a. Compliant w/ CPAP.  Marland Kitchen Orthopnea    a. Improved with CPAP.  Marland Kitchen Skin cancer    a. Basal cell carcinoma of neck s/p resection.  . ST elevation myocardial infarction (STEMI) of anterior wall ([place]) 04/10/2016    Patient Active Problem List   Diagnosis Date Noted  . Shortness of breath 04/18/2016  . Cardiac arrest ([place])   . Chest pain   . Acute ST elevation myocardial infarction (STEMI) involving left anterior descending coronary artery ([place]) 04/10/2016  . Morbid obesity ([place])   . Hyperlipidemia   . Diet-controlled diabetes mellitus ([place])   . Essential hypertension   . Obstructive sleep apnea   . ST elevation myocardial infarction involving left anterior descending (LAD) coronary artery [facility])     Past Surgical History:  Procedure Laterality Date  . CARDIAC CATHETERIZATION N/A 04/10/2016   Procedure: Left Heart  Cath and Coronary Angiography;  Surgeon: Wellington Hampshire, MD;  Location: [place] CV LAB;  Service: Cardiovascular;  Laterality: N/A;  . CARDIAC CATHETERIZATION N/A 04/10/2016   Procedure: Coronary Stent Intervention;  Surgeon: Wellington Hampshire, MD;  Location: [place] CV LAB;  Service: Cardiovascular;  Laterality: N/A;  . CATARACT EXTRACTION W/PHACO Right 04/03/2016   Procedure: CATARACT EXTRACTION PHACO AND INTRAOCULAR LENS PLACEMENT (IOC);  Surgeon: Estill Cotta, MD;  Location: [facility] ORS;  Service: Ophthalmology;  Laterality: Right;  [place] 01:13 AP% 25.8 CDE  34.18 fluid pack lot [id number]  . COLONOSCOPY  2014  . skin cancers     head    Prior to Admission medications   Medication Sig Start Date End Date Taking? Authorizing Provider  aspirin EC 81 MG tablet Take 81 mg by mouth daily.    Historical Provider, MD  atorvastatin (LIPITOR) 80 MG tablet Take 1 tablet (80 mg total) by mouth daily at 6 PM. 04/12/16   Rise Mu, PA-C  carvedilol (COREG) 3.125 MG tablet Take 1 tablet (3.125 mg total) by mouth 2 (two) times daily with a meal. 04/12/16   Ryan M Dunn, PA-C  fluticasone (FLONASE) 50 MCG/ACT nasal spray Place 2 sprays into both nostrils daily. Reported on 05/01/2016    Historical Provider, MD  furosemide (LASIX) 20 MG tablet Take 1 tablet (20 mg total) by mouth daily as needed. 04/18/16   Minna Merritts, MD  lansoprazole (PREVACID) 30 MG capsule Take 30 mg by mouth daily.    Historical Provider, MD  nitroGLYCERIN (NITROSTAT) 0.4 MG SL tablet Place 1 tablet (0.4 mg total) under the tongue every 5 (five) minutes x 3 doses as needed for chest pain. 04/12/16   Areta Haber Dunn, PA-C  potassium chloride (K-DUR) 10 MEQ tablet Take 1 tablet (10 mEq total) by mouth daily as needed. 04/18/16   Minna Merritts, MD  telmisartan (MICARDIS) 20 MG tablet Take 0.5 tablets (10 mg total) by mouth daily. 05/15/16   Minna Merritts, MD  ticagrelor (BRILINTA) 90 MG TABS tablet Take 1 tablet (90 mg total) by mouth 2 (two) times daily. 09/08/16   Minna Merritts, MD    Allergies Patient has no known allergies.  Family History  Problem Relation Age of Onset  . CAD Father   . Hypertension Father   . Alzheimer's disease Mother   . Hypertension Paternal Grandfather   . Hypertension Brother     Social History Social History  Substance Use Topics  . Smoking status: Never Smoker  . Smokeless tobacco: Not on file     Comment: no smokers in  home  . Alcohol use 0.0 oz/week     Comment: occasional drink every one or two weeks    Review of Systems Constitutional:  No fever/chills Eyes: No visual changes. ENT: No sore throat. Cardiovascular: +chest pain. Respiratory: +shortness of breath. Gastrointestinal: No abdominal pain.  No nausea, no vomiting.  No diarrhea.  No constipation. Genitourinary: Negative for dysuria. Musculoskeletal: Negative for back pain. Skin: Negative for rash. Neurological: Negative for headaches, focal weakness or numbness.  10-point ROS otherwise negative.  ____________________________________________   PHYSICAL EXAM:  VITAL SIGNS: ED Triage Vitals [11/13/16 1350]  Enc Vitals Group     BP (!) 141/71     Pulse Rate 62     Resp 18     Temp 98.6 F (37 C)     Temp Source Oral     SpO2 98 %  Weight 295 lb (133.8 kg)     Height 6' (1.829 m)     Head Circumference      Peak Flow      Pain Score 2     Pain Loc      Pain Edu?      Excl. in [place]?     Constitutional: Alert and oriented. Well appearing and in no acute distress. Eyes: Conjunctivae are normal. PERRL. EOMI. Head: Atraumatic. Nose: No congestion/rhinnorhea. Mouth/Throat: Mucous membranes are moist.  Oropharynx non-erythematous. Neck: No stridor.  No meningeal signs.   Cardiovascular: Normal rate, regular rhythm. Good peripheral circulation. Grossly normal heart sounds. Respiratory: Normal respiratory effort.  No retractions. Lungs CTAB. Gastrointestinal: Morbid obesity.  Soft and nontender. No distention.  Musculoskeletal: No lower extremity tenderness nor edema. No gross deformities of extremities. Neurologic:  Normal speech and language. No gross focal neurologic deficits are appreciated.  Skin:  Skin is warm, dry and intact. No rash noted. Psychiatric: Mood and affect are normal. Speech and behavior are normal.  ____________________________________________   LABS (all labs ordered are listed, but only abnormal results are displayed)  Labs Reviewed  COMPREHENSIVE METABOLIC PANEL - Abnormal; Notable for the following:       Result Value    Glucose, Bld 115 (*)    All other components within normal limits  CBC  TROPONIN I   ____________________________________________  EKG  ED ECG REPORT I, Kyllian Clingerman, the attending physician, personally viewed and interpreted this ECG.  Date: 11/13/2016 EKG Time: 13:45 Rate: 63 Rhythm: normal sinus rhythm QRS Axis: normal Intervals: normal ST/T Wave abnormalities: Non-specific ST segment / T-wave changes, but no evidence of acute ischemia. Conduction Disturbances: none Narrative Interpretation: unremarkable  ____________________________________________  RADIOLOGY   Dg Chest 2 View  Result Date: 11/13/2016 CLINICAL DATA:  Dyspnea EXAM: CHEST  2 VIEW COMPARISON:  04/11/2016 chest radiograph. FINDINGS: Stable cardiomediastinal silhouette with normal heart size. No pneumothorax. No pleural effusion. Lungs appear clear, with no acute consolidative airspace disease and no pulmonary edema. IMPRESSION: No active cardiopulmonary disease. Electronically Signed   By: Ilona Sorrel M.D.   On: 11/13/2016 15:21    ____________________________________________   PROCEDURES  Procedure(s) performed:   Procedures   Critical Care performed: No ____________________________________________   INITIAL IMPRESSION / ASSESSMENT AND PLAN / ED COURSE  Pertinent labs & imaging results that were available during my care of the patient were reviewed by me and considered in my medical decision making (see chart for details).  The patient has been intermittently symptomatic for about a month.  He states that the symptoms are still better over the last couple of days but he is getting concerned because the holidays are approaching and he has not been able to see his cardiologist.  His EKG, chest x-ray, and lab work including troponin are all normal today.  I do not feel that he would benefit from a second troponin given the duration of his symptoms.  I will send Dr. Rockey Situ a message through [facility] to  let him know about the patient's visit and see if it would be possible to work him into the clinic.  I provided reassurance to the patient and encouraged him to continue taking all his regular medications.  I gave my usual and customary return precautions.  The about his presentation or symptoms suggest pulmonary embolism (Wells Score for PE is zero and he is on Brilinta and ASA) and there is no indication of acute coronary syndrome at this time.  Marland Kitchen  ____________________________________________  FINAL CLINICAL IMPRESSION(S) / ED DIAGNOSES  Final diagnoses:  Chest pain, unspecified type     MEDICATIONS GIVEN DURING THIS VISIT:  Medications - No data to display   NEW OUTPATIENT MEDICATIONS STARTED DURING THIS VISIT:  Discharge Medication List as of 11/13/2016  7:42 PM      Discharge Medication List as of 11/13/2016  7:42 PM      Discharge Medication List as of 11/13/2016  7:42 PM       Note:  This document was prepared using Dragon voice recognition software and may include unintentional dictation errors.    Hinda Kehr, MD 11/13/16 2024

## 2016-11-13 NOTE — ED Notes (Signed)
Signature pad not working.  Patient verbalized understanding of discharge instructions and has no further questions. 

## 2016-11-13 NOTE — ED Triage Notes (Signed)
Intermittent chest pressure and SOB x 1 month.

## 2016-11-13 NOTE — Discharge Instructions (Signed)

## 2016-11-14 ENCOUNTER — Encounter: Payer: Self-pay | Admitting: Nurse Practitioner

## 2016-11-14 ENCOUNTER — Telehealth: Payer: Self-pay | Admitting: Cardiovascular Disease

## 2016-11-14 ENCOUNTER — Ambulatory Visit (INDEPENDENT_AMBULATORY_CARE_PROVIDER_SITE_OTHER): Payer: BLUE CROSS/BLUE SHIELD | Admitting: Nurse Practitioner

## 2016-11-14 VITALS — BP 120/70 | HR 59 | Ht 72.0 in | Wt 300.5 lb

## 2016-11-14 DIAGNOSIS — E782 Mixed hyperlipidemia: Secondary | ICD-10-CM

## 2016-11-14 DIAGNOSIS — R0602 Shortness of breath: Secondary | ICD-10-CM

## 2016-11-14 DIAGNOSIS — I10 Essential (primary) hypertension: Secondary | ICD-10-CM | POA: Diagnosis not present

## 2016-11-14 DIAGNOSIS — I25119 Atherosclerotic heart disease of native coronary artery with unspecified angina pectoris: Secondary | ICD-10-CM | POA: Diagnosis not present

## 2016-11-14 DIAGNOSIS — R072 Precordial pain: Secondary | ICD-10-CM

## 2016-11-14 MED ORDER — CLOPIDOGREL BISULFATE 75 MG PO TABS: 75.0000 mg | ORAL_TABLET | Freq: Every day | ORAL | 6 refills | Status: DC

## 2016-11-14 NOTE — Telephone Encounter (Signed)
Patient states that he was having some chest pain and shortness of breath. He reports that he has had ongoing chest discomfort that comes and goes along. Previously he used symbicort for the shortness of breath which helped some but that medication has expired and he needs a refill. Let him know that we do not typically fill respiratory medications but that when he comes in to see our physician assistant he could discuss this with him at that time. He states that he has tried to get in to be seen before and has not been able to get appointment. Let him know first available appointments that are currently available and he said he could not wait. He states that he needs to see someone before Christmas to address these ongoing issues or he may find another physician who can get him in to be seen. Let him know that I would check with scheduling to see what is available and give him a call back.

## 2016-11-14 NOTE — Telephone Encounter (Signed)
Spoke with patient and let him know that we could see him today at 11:30 here in our office. He was in agreement with this appointment and had no further questions at this time. Appointment scheduled and verbalized understanding of location and time.

## 2016-11-14 NOTE — Progress Notes (Signed)
Office Visit    Patient Name: Bobby Tanner Date of Encounter: 11/14/2016  Primary Care Provider:  Rusty Aus, MD Primary Cardiologist:  Johnny Bridge, MD   Chief Complaint    65 y/o ? with a h/o anterior STEMI in 03/2016 req DES to the LAD, who presents to clinic with recurrent, intermittent chest pain, dyspnea, and occasional lightheadedness.  Past Medical History    Past Medical History:  Diagnosis Date  . Arthritis    a. Hands  . Asthma    a. had as a child. pollen could aggrevate but does not use inhalers  . CAD (coronary artery disease)    a. 2014 c/p w/ reportedly nl cardiac w/u;  b. 03/2016 Ant STEMI c/b VT/VF requiring multiple defibs-->Cath/PCI: LM nl, LAD 99p w/ heavy thrombus (3.5 x 15 Xience [place] DES), D1 50, LCX min irregs throughout, RCA min irregs throughout, EF 45-50%  . Cardiac arrest with ventricular fibrillation ([place])    a. 04/10/2016 VT/VF arrest x 2 in setting of anterior ST elevation MI requiring multiple defibs, chest compressions, amio, epi.  . Cataract    a. 03/2016 s/p R cataract extraction and IOL placement.  . Diet-controlled diabetes mellitus ([place])   . Essential hypertension   . GERD (gastroesophageal reflux disease)   . Hemorrhoids   . HOH (hard of hearing)   . Hyperlipidemia   . Ischemic cardiomyopathy    a. 03/2016 LV Gram: EF 45-50%;  b. 03/2014 Echo: EF 50-55%, apical AK, anteroapical HK, mild MR.  . Morbid obesity ([place])   . Obstructive sleep apnea    a. Compliant w/ CPAP.  Marland Kitchen Orthopnea    a. Improved with CPAP.  Marland Kitchen Skin cancer    a. Basal cell carcinoma of neck s/p resection.  . ST elevation myocardial infarction (STEMI) of anterior wall ([place]) 04/10/2016   Past Surgical History:  Procedure Laterality Date  . CARDIAC CATHETERIZATION N/A 04/10/2016   Procedure: Left Heart Cath and Coronary Angiography;  Surgeon: Wellington Hampshire, MD;  Location: [place] CV LAB;  Service: Cardiovascular;  Laterality: N/A;  . CARDIAC CATHETERIZATION  N/A 04/10/2016   Procedure: Coronary Stent Intervention;  Surgeon: Wellington Hampshire, MD;  Location: [place] CV LAB;  Service: Cardiovascular;  Laterality: N/A;  . CATARACT EXTRACTION W/PHACO Right 04/03/2016   Procedure: CATARACT EXTRACTION PHACO AND INTRAOCULAR LENS PLACEMENT (IOC);  Surgeon: Estill Cotta, MD;  Location: [facility] ORS;  Service: Ophthalmology;  Laterality: Right;  [place] 01:13 AP% 25.8 CDE 34.18 fluid pack lot [id number]  . COLONOSCOPY  2014  . skin cancers     head    Allergies  No Known Allergies  History of Present Illness    65 y/o ? with the above complex PMH. He has a h/o CAD and is s/p Anterior STEMI in 03/2016 c/b VF arrest.  His LAD was found to have severe thrombotic dzs and he was successfully treated with DES. He has residual 50% stenosis in the D1 with minor irregs throughout the LCX and RCA.  He initially participated in cardiac rehab but due to change in insurance, he stopped.  Since his MI, he has had some degree of mild, chronic dyspnea and also intermittent lightheadedness.  His coreg dose was reduced in August due to intermittent low blood pressures @ home.  He says that he initially felt a little bit better after that change but over the past 1.5 months, he has again noted intermittent lightheadedness and occasional drops in blood  pressure into the low 100's or high 90's.  He has also been having intermittent rest and exertional substernal chest discomfort and dyspnea.  Chest discomfort is most likely to occur during periods of stress (sitting in traffic) or when he is hurrying to get something done.  He works 2 days/wk and has to commute 90 mins each way on those days.  He often notes mild chest tightness and dyspnea while sitting in traffic in [place] on his way home.  This may last up to several hours at a time and then resolve spontaneously.  He has not taken ntg for his c/p.  Ss are occurring several x /wk over the past two weeks and have become quite  bothersome.    Due to prolonged Ss on 12/18, he presented to the [facility] ED.  There, ECG was non-acute.  Trop was nl.  CBC/lytes/CXR nl.  He was d/c'd home.  He did have recurrent, mild chest pressure and dyspnea while getting ready to come to our office today and then had mild LH after getting out of his car and walking into our building today.  All Ss have resolved.  He denies palpitations, pnd, orthopnea, n, v, dizziness, syncope, or edema.  He has gained about 8 lbs over the past few months and in that setting has noted his pants fitting tighter.   Home Medications    Prior to Admission medications   Medication Sig Start Date End Date Taking? Authorizing Provider  aspirin EC 81 MG tablet Take 81 mg by mouth daily.   Yes Historical Provider, MD  atorvastatin (LIPITOR) 80 MG tablet Take 1 tablet (80 mg total) by mouth daily at 6 PM. 04/12/16  Yes Ryan M Dunn, PA-C  carvedilol (COREG) 3.125 MG tablet Take 1 tablet (3.125 mg total) by mouth 2 (two) times daily with a meal. 04/12/16  Yes Ryan M Dunn, PA-C  fluticasone (FLONASE) 50 MCG/ACT nasal spray Place 2 sprays into both nostrils daily. Reported on 05/01/2016   Yes Historical Provider, MD  furosemide (LASIX) 20 MG tablet Take 1 tablet (20 mg total) by mouth daily as needed. 04/18/16  Yes Minna Merritts, MD  lansoprazole (PREVACID) 30 MG capsule Take 30 mg by mouth daily.   Yes Historical Provider, MD  nitroGLYCERIN (NITROSTAT) 0.4 MG SL tablet Place 1 tablet (0.4 mg total) under the tongue every 5 (five) minutes x 3 doses as needed for chest pain. 04/12/16  Yes Ryan M Dunn, PA-C  potassium chloride (K-DUR) 10 MEQ tablet Take 1 tablet (10 mEq total) by mouth daily as needed. 04/18/16  Yes Minna Merritts, MD  telmisartan (MICARDIS) 20 MG tablet Take 0.5 tablets (10 mg total) by mouth daily. 05/15/16  Yes Minna Merritts, MD  ticagrelor (BRILINTA) 90 MG TABS tablet Take 1 tablet (90 mg total) by mouth 2 (two) times daily. 09/08/16  Yes Minna Merritts,  MD    Review of Systems    He reports several symptoms that have been intermittent ever since his MI as outlined above.  He c/o intermittent LH, occas low bps, intermittent rest and exertional chest tightness, and rest and exertional dyspnea.  All other systems reviewed and are otherwise negative except as noted above.  Physical Exam    VS:  BP 120/70 (BP Location: Left Arm, Patient Position: Sitting, Cuff Size: Large)   Pulse (!) 59   Ht 6' (1.829 m)   Wt (!) 300 lb 8 oz (136.3 kg)   BMI 40.76 kg/m  ,  BMI Body mass index is 40.76 kg/m. GEN: Well nourished, well developed, in no acute distress.  HEENT: normal.  Neck: Supple, obese, difficult to gauge JVP, carotid bruits, or masses. Cardiac: RRR, no murmurs, rubs, or gallops. No clubbing, cyanosis, edema.  Radials/DP/PT 2+ and equal bilaterally.  Respiratory:  Respirations regular and unlabored, clear to auscultation bilaterally. GI: Soft, nontender, nondistended, BS + x 4. MS: no deformity or atrophy. Skin: warm and dry, no rash. Neuro:  Strength and sensation are intact. Psych: Normal affect.  Accessory Clinical Findings    ECG - From ED 12/18  RSR, 64, LAD, LAFB, poor R progression.  Assessment & Plan    1.  Midsternal Chest Pain/CAD:  S/p prior anterior MI and VF arrest with LAD DES in 03/2016.  He says that he has had some amt of mild intermittent chest tightness since then, but over the past 6 wks, and more specifically, the past 2 wks, Ss have been occurring more frequently and lasting longer.  C/p occurs @ both rest and with exertion and is often associated with stress/anxiety, or hurrying to get something done.  He has also had dyspnea, which like c/p, has been present since his event in May, but is worsening.  He was eval in the ED yesterday and despite prolonged Ss earlier in the day, trop was nl.  ECG was non-acute.  We discussed his constellation of symptoms for 45 minutes.  Because he notes exertional chest tightness, I  will arrange for a two day exercise myoview to r/o ischemia.  He also notes that he drives 90 mins each way, twice a week for work.  In that setting, I will obtain a d dimer to day.  If this is abnl, he will require a CTA of the chest to r/o PE.  Some of his dyspnea symptoms at rest have been present in an intermittent fashion dating back to his MI.  I question what role brilinta might be playing.  As he is now 7 months out since his event, I will switch him from brilinta to plavix and he will take his first dose today.  He will otw remain on ASA, statin,  blocker, and statin.  I am stopping his micardis due to intermittent low bp's and lightheadedness.  On PPI for h/o GERD.  He will f/u in the office following his myoview.  2.  Dyspnea:  See #1.  He has been having intermittent rest and exertional dyspnea.  CXR nl yesterday. Checking d dimer and BNP today. Also switching from brilinta to plavix. Myoview as above.  If D dimer is abnl  CTA chest. If BNP elevated, I will ask him to take his lasix, which he has at home to use prn but doesn't use, and schedule echo (prev nl EF).  He is euvolemic on exam today.  He does have a h/o asthma but is not wheezing on exam.  He has arranged f/u with pulmonology for tomorrow.  3.  Essential HTN:  Notes intermittent low blood pressures with lightheadedness.  BPs sometimes in high 90's to low 100's and it during those times that he is more likely to have c/p and sob as well.  BP's otw trending in 120's.  I advised that he may stop his micardis.  4.  HL:  LDL was 55 in June.  Cont high potency statin therapy.  5.  Morbid obesity:  He has gained about 8 lbs since the summer.  We briefly discussed need to reduce  calories.  If myoview reassuring, would recommend more regular exercise.  6.  Anxiety:  He notes that since his event, he is likely to get upset about less and less significant things.  He recognizes that symptoms are more likely to occur during periods of stress.   He will f/u with primary care.  7.  Dispo:  bnp and d dimer today.  F/u myoview and office f/u shortly thereafter.   Murray Hodgkins, NP 11/14/2016, 12:28 PM

## 2016-11-14 NOTE — Telephone Encounter (Signed)
Pt was seen last night in ED he is coming 11/28/16 to see Thurmond Butts  He normally see's Dr Rockey Situ He was seen in there for CP  Please call

## 2016-11-14 NOTE — Telephone Encounter (Signed)
Just add him on for later this morning - 11:30 should work.

## 2016-11-14 NOTE — Patient Instructions (Addendum)
Medication Instructions:  Your physician has recommended you make the following change in your medication:  1. STOP Micardis (Telmisartan) 2. STOP Brilinta (Ticagrelor) 3. START Plavix 75 mg once daily   Labwork: Labs today were D-dimer and BNP  Testing/Procedures: [place]  Your caregiver has ordered a Stress Test with nuclear imaging. The purpose of this test is to evaluate the blood supply to your heart muscle. This procedure is referred to as a "Non-Invasive Stress Test." This is because other than having an IV started in your vein, nothing is inserted or "invades" your body. Cardiac stress tests are done to find areas of poor blood flow to the heart by determining the extent of coronary artery disease (CAD).   Please note: these test may take anywhere between 2-4 hours to complete  PLEASE REPORT TO [place] TO [place]  Date of Procedure:Tuesday Jan. 2 & Wednesday Jan. 3, 2018 at 08:00AM  Arrival Time for Procedure:_Arrive at 07:45AM to register  Instructions regarding medication:   __X__:  Hold carvedilol the night before procedure and morning of procedure  __X__:  Hold Lasix the morning of your procedures.    PLEASE NOTIFY THE OFFICE AT LEAST 9 HOURS IN ADVANCE IF YOU ARE UNABLE TO KEEP YOUR APPOINTMENT.  [phone] AND  PLEASE NOTIFY NUCLEAR MEDICINE AT [facility] AT LEAST 24 HOURS IN ADVANCE IF YOU ARE UNABLE TO KEEP YOUR APPOINTMENT. [phone]  How to prepare for your Myoview test:  1. Do not eat or drink after midnight 2. No caffeine for 24 hours prior to test 3. No smoking 24 hours prior to test. 4. Your medication may be taken with water.  If your doctor stopped a medication because of this test, do not take that medication. 5. Ladies, please do not wear dresses.  Skirts or pants are appropriate. Please wear a short sleeve shirt. 6. No perfume, cologne or lotion. 7. Wear comfortable walking  shoes. No heels!   Follow-Up: Your physician recommends that you schedule a follow-up appointment after testing with Dr. Rockey Situ or Christell Faith PA  It was a pleasure seeing you today here in the office. Please do not hesitate to give [place] a call back if you have any further questions. [place], BSN     Cardiac Nuclear Scanning A cardiac nuclear scan is used to check your heart for problems, such as the following:  A portion of the heart is not getting enough blood.  Part of the heart muscle has died, which happens with a heart attack.  The heart wall is not working normally.  In this test, a radioactive dye (tracer) is injected into your bloodstream. After the tracer has traveled to your heart, a scanning device is used to measure how much of the tracer is absorbed by or distributed to various areas of your heart. LET [facility] CARE PROVIDER KNOW ABOUT:  Any allergies you have.  All medicines you are taking, including vitamins, herbs, eye drops, creams, and over-the-counter medicines.  Previous problems you or members of your family have had with the use of anesthetics.  Any blood disorders you have.  Previous surgeries you have had.  Medical conditions you have.  RISKS AND COMPLICATIONS Generally, this is a safe procedure. However, as with any procedure, problems can occur. Possible problems include:   Serious chest pain.  Rapid heartbeat.  Sensation of warmth in your chest. This usually passes quickly.  BEFORE THE PROCEDURE Ask your health care provider about changing or stopping your regular medicines. PROCEDURE This procedure is usually done at a hospital and takes 2-4 hours.  An IV tube is inserted into one of your veins.  Your health care provider will inject a small amount of radioactive tracer through the tube.  You will then wait for 20-40 minutes while the tracer travels through your bloodstream.  You will lie down on an exam table so  images of your heart can be taken. Images will be taken for about 15-20 minutes.  You will exercise on a treadmill or stationary bike. While you exercise, your heart activity will be monitored with an electrocardiogram (ECG), and your blood pressure will be checked.  If you are unable to exercise, you may be given a medicine to make your heart beat faster.  When blood flow to your heart has peaked, tracer will again be injected through the IV tube.  After 20-40 minutes, you will get back on the exam table and have more images taken of your heart.  When the procedure is over, your IV tube will be removed. AFTER THE PROCEDURE  You will likely be able to leave shortly after the test. Unless your health care provider tells you otherwise, you may return to your normal schedule, including diet, activities, and medicines.  Make sure you find out how and when you will get your test results. This information is not intended to replace advice given to you by your health care provider. Make sure you discuss any questions you have with your health care provider. Document Released: 12/08/2004 Document Revised: 11/18/2013 Document Reviewed: 10/22/2013 Elsevier Interactive Patient Education  2017 [employer].

## 2016-11-15 LAB — D-DIMER, QUANTITATIVE: D-DIMER: 0.46 mg/L FEU (ref 0.00–0.49)

## 2016-11-15 LAB — BRAIN NATRIURETIC PEPTIDE: BNP: 16 pg/mL (ref 0.0–100.0)

## 2016-11-16 ENCOUNTER — Telehealth: Payer: Self-pay | Admitting: Nurse Practitioner

## 2016-11-16 NOTE — Telephone Encounter (Signed)
Results given to patient as follows: Notes Recorded by Rogelia Mire, NP on 11/15/2016 at 1:47 PM EST Both d dimer and bnp are nl. This makes PE (clot in the lungs) or excess volume/fluid overload very unlikely. No need for either CTA or echo at this time. Continue with plan for stress test as discussed yesterday. ------  Patient verbalized understanding.

## 2016-11-16 NOTE — Telephone Encounter (Signed)
Pt is returning call regarding lab results.  

## 2016-11-28 ENCOUNTER — Telehealth: Payer: Self-pay | Admitting: Cardiovascular Disease

## 2016-11-28 ENCOUNTER — Encounter
Admission: RE | Admit: 2016-11-28 | Discharge: 2016-11-28 | Disposition: A | Discharge status: Home / Self Care | Payer: BLUE CROSS/BLUE SHIELD | Source: Ambulatory Visit | Attending: Nurse Practitioner | Admitting: Nurse Practitioner

## 2016-11-28 ENCOUNTER — Ambulatory Visit: Payer: BLUE CROSS/BLUE SHIELD | Admitting: Physician Assistant

## 2016-11-28 DIAGNOSIS — R072 Precordial pain: Secondary | ICD-10-CM | POA: Diagnosis present

## 2016-11-28 DIAGNOSIS — R0602 Shortness of breath: Secondary | ICD-10-CM

## 2016-11-28 LAB — NM MYOCAR MULTI W/SPECT W/WALL MOTION / EF
CHL CUP NUCLEAR SSS: 1
CSEPHR: 51 %
CSEPPHR: 81 {beats}/min
LV dias vol: 93 mL (ref 62–150)
LVSYSVOL: 33 mL
Rest HR: 59 {beats}/min
SDS: 0
SRS: 1

## 2016-11-28 MED ORDER — REGADENOSON 0.4 MG/5ML IV SOLN
0.4000 mg | Freq: Once | INTRAVENOUS | Status: AC
  Administered 2016-11-28: 0.4 mg via INTRAVENOUS

## 2016-11-28 MED ORDER — TECHNETIUM TC 99M TETROFOSMIN IV KIT
32.3600 | PACK | Freq: Once | INTRAVENOUS | Status: AC | PRN
  Administered 2016-11-28: 32.36 via INTRAVENOUS

## 2016-11-28 NOTE — Telephone Encounter (Signed)
Pt calling stating he was told not to come to the second day of his Myoview  Is making sure that is correct and would like the results from today  Please advise.

## 2016-11-29 ENCOUNTER — Encounter: Admission: RE | Admit: 2016-11-29 | Payer: BLUE CROSS/BLUE SHIELD | Source: Ambulatory Visit

## 2016-11-29 ENCOUNTER — Encounter: Payer: Self-pay | Admitting: Nurse Practitioner

## 2016-12-06 ENCOUNTER — Ambulatory Visit (INDEPENDENT_AMBULATORY_CARE_PROVIDER_SITE_OTHER): Payer: BLUE CROSS/BLUE SHIELD | Admitting: Cardiovascular Disease

## 2016-12-06 ENCOUNTER — Encounter: Payer: Self-pay | Admitting: Cardiovascular Disease

## 2016-12-06 VITALS — BP 120/80 | HR 71 | Ht 72.0 in | Wt 303.8 lb

## 2016-12-06 DIAGNOSIS — F419 Anxiety disorder, unspecified: Secondary | ICD-10-CM

## 2016-12-06 DIAGNOSIS — E782 Mixed hyperlipidemia: Secondary | ICD-10-CM

## 2016-12-06 DIAGNOSIS — E119 Type 2 diabetes mellitus without complications: Secondary | ICD-10-CM

## 2016-12-06 DIAGNOSIS — I10 Essential (primary) hypertension: Secondary | ICD-10-CM | POA: Diagnosis not present

## 2016-12-06 DIAGNOSIS — R079 Chest pain, unspecified: Secondary | ICD-10-CM

## 2016-12-06 DIAGNOSIS — I2102 ST elevation (STEMI) myocardial infarction involving left anterior descending coronary artery: Secondary | ICD-10-CM | POA: Diagnosis not present

## 2016-12-06 DIAGNOSIS — R0602 Shortness of breath: Secondary | ICD-10-CM

## 2016-12-06 MED ORDER — SILDENAFIL CITRATE 20 MG PO TABS: 20.0000 mg | ORAL_TABLET | Freq: Three times a day (TID) | ORAL | 3 refills | Status: DC | PRN

## 2016-12-06 NOTE — Progress Notes (Signed)
Patient ID: Bobby Tanner, male   DOB: 12/07/1950, 66 y.o.   MRN: [id number] Cardiology Office Note  Date:  12/06/2016   ID:  Arpad Halder Cauthon, DOB [birth year], MRN [id number]  PCP:  Rusty Aus, MD   Chief Complaint  Patient presents with  . OTHER    F/u stress test. Meds reviewed verbally with pt.    HPI:  66 year old gentleman with history of acute anterior ST elevation MI s/p PCI to the proximal LAD with VF arrest requiring chest compressions 04/10/2016,  residual 50% diagonal disease discharged on 04/12/2016 with follow-up today for his coronary artery disease, STEMI   In follow-up today, he reports having periodic episodes of chest tightness, blurry vision, shortness of breath.  he called the office over the holidays late December 2017 He was referred  to the emergency room 11/13/16 Vision issues seem to stay for a few days Neg D-dimer, normal BNP in the hospital  TNT normal, BMP ok He had a stress test showing no ischemia . Results reviewed with him   Reports that he is having significant work stress  After his MI, went back to work, took on a full load When he thinks about things that need to be done, he develops shortness of breath, vision issues, chest tightness  Typically does not have any problems with activity  Has not started exercise program   lab work reviewed, HBA1C 6.3  He did participate in cardiac rehabilitation, insurance changed and he was no  longer able to afford it Total cholesterol dramatically improved, LDL down to 50s,   Other past medical history reviewed with him Previous Echo showed EF 50-55%, akinesis of the apical myocardium, hypokinesis of the anteroseptal myocardium, LV diastolic function was normal, mild MR, RV systolic function was normal, PASP was normal.   Cardiac catheterization 04/10/2016: 1st Diag lesion, 50% stenosed. Prox LAD lesion, 99% stenosed. Post intervention, there is a 0% residual stenosis. The lesion was not previously  treated.The left ventricular systolic function is normal.  1. Severe one-vessel coronary artery disease with heavy thrombotic lesion in the proximal LAD. Otherwise no obstructive disease. 2. Mildly reduced LV systolic function with mild anterior wall hypokinesis. Moderately to severely elevated left ventricular end-diastolic pressure. 3. Successful aspiration thrombectomy and drug-eluting stent placement to the proximal LAD.  PMH:   has a past medical history of Arthritis; Asthma; CAD (coronary artery disease); Cardiac arrest with ventricular fibrillation ([place]); Cataract; Diet-controlled diabetes mellitus ([place]); Essential hypertension; GERD (gastroesophageal reflux disease); Hemorrhoids; HOH (hard of hearing); Hyperlipidemia; Ischemic cardiomyopathy; Morbid obesity ([place]); Obstructive sleep apnea; Orthopnea; Skin cancer; and ST elevation myocardial infarction (STEMI) of anterior wall ([place]) (04/10/2016).  PSH:    Past Surgical History:  Procedure Laterality Date  . CARDIAC CATHETERIZATION N/A 04/10/2016   Procedure: Left Heart Cath and Coronary Angiography;  Surgeon: Wellington Hampshire, MD;  Location: [facility];  Service: Cardiovascular;  Laterality: N/A;  . CARDIAC CATHETERIZATION N/A 04/10/2016   Procedure: Coronary Stent Intervention;  Surgeon: Wellington Hampshire, MD;  Location: [facility];  Service: Cardiovascular;  Laterality: N/A;  . CATARACT EXTRACTION W/PHACO Right 04/03/2016   Procedure: CATARACT EXTRACTION PHACO AND INTRAOCULAR LENS PLACEMENT (IOC);  Surgeon: Estill Cotta, MD;  Location: [facility];  Service: Ophthalmology;  Laterality: Right;  [place] 01:13 AP% 25.8 CDE 34.18 fluid pack lot [id number] H  . COLONOSCOPY  2014  . skin cancers     head    Current Outpatient Prescriptions  Medication Sig Dispense  Refill  . aspirin EC 81 MG tablet Take 81 mg by mouth daily.    Marland Kitchen atorvastatin (LIPITOR) 80 MG tablet Take 1 tablet (80 mg total) by mouth daily at 6 PM. 30 tablet 11   . carvedilol (COREG) 3.125 MG tablet Take 1 tablet (3.125 mg total) by mouth 2 (two) times daily with a meal. 60 tablet 11  . clopidogrel (PLAVIX) 75 MG tablet Take 1 tablet (75 mg total) by mouth daily. 30 tablet 6  . fluticasone (FLONASE) 50 MCG/ACT nasal spray Place 2 sprays into both nostrils daily. Reported on 05/01/2016    . furosemide (LASIX) 20 MG tablet Take 1 tablet (20 mg total) by mouth daily as needed. 30 tablet 6  . lansoprazole (PREVACID) 30 MG capsule Take 30 mg by mouth daily.    . nitroGLYCERIN (NITROSTAT) 0.4 MG SL tablet Place 1 tablet (0.4 mg total) under the tongue every 5 (five) minutes x 3 doses as needed for chest pain. 25 tablet 12  . potassium chloride (K-DUR) 10 MEQ tablet Take 1 tablet (10 mEq total) by mouth daily as needed. 30 tablet 6  . sildenafil (REVATIO) 20 MG tablet Take 1 tablet (20 mg total) by mouth 3 (three) times daily as needed. 90 tablet 3   No current facility-administered medications for this visit.      Allergies:   Patient has no known allergies.   Social History:  The patient  reports that he has never smoked. He has never used smokeless tobacco. He reports that he drinks alcohol. He reports that he does not use drugs.   Family History:   family history includes Alzheimer's disease in his mother; CAD in his father; Hypertension in his brother, father, and paternal grandfather.    Review of Systems: Review of Systems  Constitutional: Negative.   Respiratory: Positive for shortness of breath.   Cardiovascular: Positive for chest pain.  Gastrointestinal: Negative.   Musculoskeletal: Negative.   Neurological: Negative.   Psychiatric/Behavioral: Negative.   All other systems reviewed and are negative.    PHYSICAL EXAM: VS:  BP 120/80 (BP Location: Left Arm, Patient Position: Sitting, Cuff Size: Large)   Pulse 71   Ht 6' (1.829 m)   Wt (!) 303 lb 12 oz (137.8 kg)   BMI 41.20 kg/m  , BMI Body mass index is 41.2 kg/m. GEN: Well  nourished, well developed, in no acute distress, obese  HEENT: normal  Neck: no JVD, carotid bruits, or masses Cardiac: RRR; no murmurs, rubs, or gallops, trace bilateral lower extremity edema to below the knees Respiratory:  clear to auscultation bilaterally, normal work of breathing GI: soft, nontender, nondistended, + BS MS: no deformity or atrophy  Skin: warm and dry, no rash Neuro:  Strength and sensation are intact Psych: euthymic mood, full affect    Recent Labs: 04/10/2016: TSH 2.357 11/13/2016: ALT 23; BUN 12; Creatinine, Ser 0.99; Hemoglobin 14.8; Platelets 188; Potassium 3.8; Sodium 137 11/14/2016: BNP 16.0    Lipid Panel Lab Results  Component Value Date   CHOL 189 04/11/2016   HDL 37 (L) 04/11/2016   LDLCALC 132 (H) 04/11/2016   TRIG 100 04/11/2016      Wt Readings from Last 3 Encounters:  12/06/16 (!) 303 lb 12 oz (137.8 kg)  11/14/16 (!) 300 lb 8 oz (136.3 kg)  11/13/16 295 lb (133.8 kg)       ASSESSMENT AND PLAN: Mixed hyperlipidemia Cholesterol is at goal on the current lipid regimen. No changes to the  medications were made.  Essential hypertension Blood pressure is well controlled on today's visit. No changes made to the medications.  ST elevation myocardial infarction involving left anterior descending (LAD) coronary artery [facility]) Previous hospital against discussed with him, placement of proximal LAD stent  Morbid obesity ([place]) We have encouraged continued exercise, careful diet management in an effort to lose weight.  Diet-controlled diabetes mellitus ([place])  Shortness of breath Etiology of his symptoms unclear though after long discussion concerning for stress related cause. Recent negative stress test, does not happen with exertion, only happens when thinking about work and work stress Sometimes associated with vision issues, chest tightness Less likely mild component of diastolic CHF. Recommended he could try Lasix as needed for shortness of  breath with exertion  Chest pain, unspecified type As above, likely having anxiety induced atypical chest discomfort Recent negative stress test, no significant symptoms with exertion  Anxiety Recommended regular exercise program  If he continues to have symptoms recommended he change his lifestyle, discuss with Dr. Sabra Heck, consider either as needed medication such as Xanax or medication such as Paxil   Disposition:   F/U  6 months   Total encounter time more than 25 minutes  Greater than 50% was spent in counseling and coordination of care with the patient     Signed, Esmond Plants, M.D., Ph.D. 12/06/2016  [place], [place]

## 2016-12-06 NOTE — Patient Instructions (Signed)

## 2017-03-23 ENCOUNTER — Telehealth: Payer: Self-pay | Admitting: Cardiovascular Disease

## 2017-03-23 NOTE — Telephone Encounter (Signed)
Pt would like a nurse to call and discuss his medications. He has 3. States he has continued not to feel well. Pressure in his chest, breathing difficulties. The same issues before the stress test. Please  Call.

## 2017-03-23 NOTE — Telephone Encounter (Signed)
Left message for pt to call back  °

## 2017-03-23 NOTE — Telephone Encounter (Signed)
Spoke w/ pt.  He states that he would like to change his meds around to see if he will feel better. Recommended he come in to discuss w/ Dr. Rockey Situ, as he has several concerns. Call transferred to scheduling to set up appt.

## 2017-03-25 IMAGING — DX DG CHEST 1V PORT
1 series · 1 of 1 positions shown · non-contrast
Comparison: None.

CLINICAL DATA: Post cardiac arrest

EXAM:
PORTABLE CHEST 1 VIEW

[chest ap]
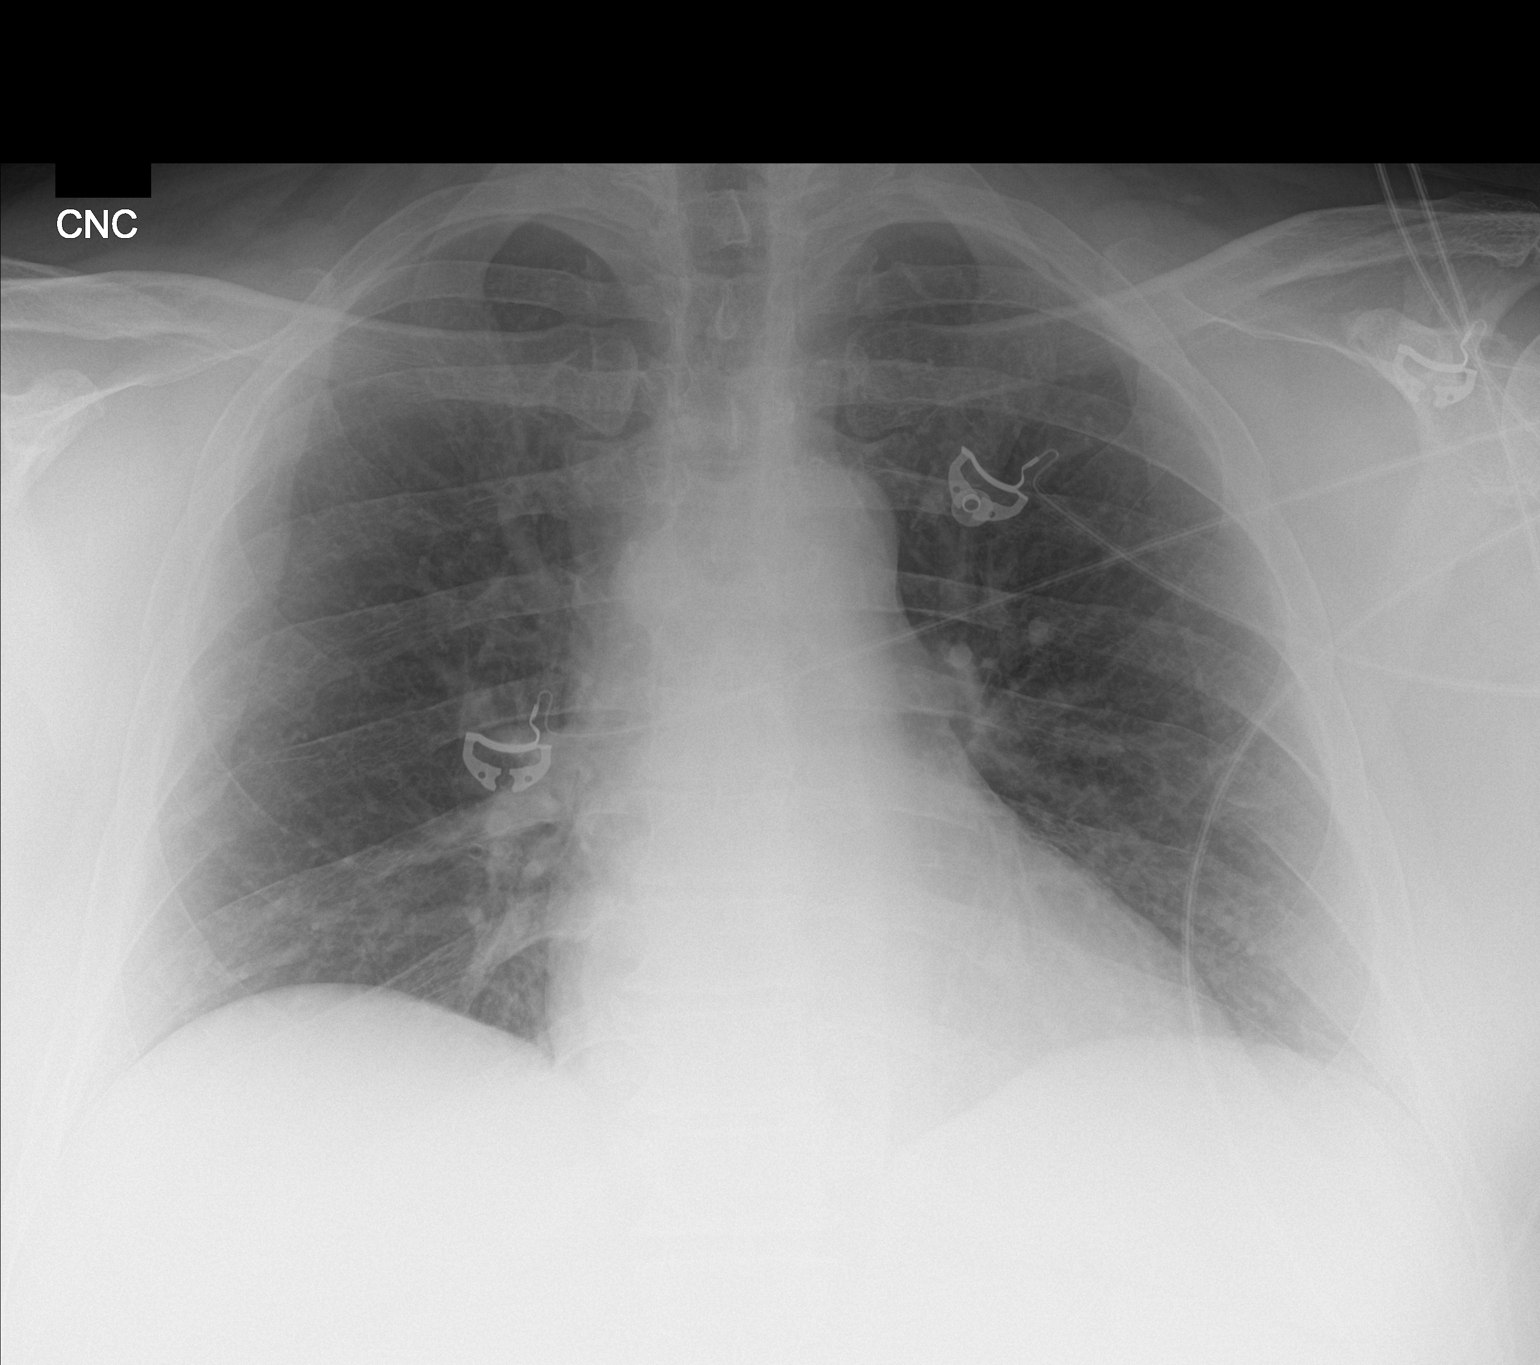

[1 of 1 positions shown; findings below may reference images not displayed]

FINDINGS: Normal heart size. Normal mediastinal contour. No pneumothorax. No
pleural effusion. Lungs appear clear, with no acute consolidative
airspace disease and no pulmonary edema.
IMPRESSION: No active disease.

## 2017-03-29 ENCOUNTER — Telehealth: Payer: Self-pay | Admitting: Cardiovascular Disease

## 2017-03-29 NOTE — Telephone Encounter (Signed)
Spoke w/ pt. He denies any recent wt loss. Reports that low BP is not a new occurrence, that his BP goes up and down.  Denies chest pain at the moment, he does report SOB. He denies edema or wt gain.  He reports a pressure behind  He currently takes Coreg 3.125 once daily instead of BID.  He has also been taking Micardis 20 mg daily.  This was d/c'd at his ov w/ Ignacia Bayley, NP on 11/14/16, but he resumed it on his own.  Pt states that Lipitor causes him to be SOB and he would like to come in to talk to Dr. Rockey Situ about his meds and all of their side effects. Pt has not taken any BP meds yet this am.  Advised him to hold carvedilol and micardis until his appt w/ Dr. Rockey Situ tomorrow - he states that he will not take Lipitor either until he discusses this.

## 2017-03-29 NOTE — Telephone Encounter (Signed)
Pt calling stating his BP is dropping It is 103/66 When it gets low he states he gets SOB  (Did not hear it over the phone)  States when it is higher than that everything seems to be fine, but that usually lasts a few days I have moved up his appointment for 03/30/17 to see Dr Rockey Situ He would like to know if there is anything he can do in the meantime  Please advise.

## 2017-03-30 ENCOUNTER — Encounter: Payer: Self-pay | Admitting: Cardiovascular Disease

## 2017-03-30 ENCOUNTER — Ambulatory Visit (INDEPENDENT_AMBULATORY_CARE_PROVIDER_SITE_OTHER): Payer: BLUE CROSS/BLUE SHIELD | Admitting: Cardiovascular Disease

## 2017-03-30 VITALS — BP 144/84 | HR 60 | Ht 72.0 in | Wt 301.2 lb

## 2017-03-30 DIAGNOSIS — I2102 ST elevation (STEMI) myocardial infarction involving left anterior descending coronary artery: Secondary | ICD-10-CM

## 2017-03-30 DIAGNOSIS — R079 Chest pain, unspecified: Secondary | ICD-10-CM

## 2017-03-30 DIAGNOSIS — I10 Essential (primary) hypertension: Secondary | ICD-10-CM | POA: Diagnosis not present

## 2017-03-30 DIAGNOSIS — I469 Cardiac arrest, cause unspecified: Secondary | ICD-10-CM | POA: Diagnosis not present

## 2017-03-30 DIAGNOSIS — I2109 ST elevation (STEMI) myocardial infarction involving other coronary artery of anterior wall: Secondary | ICD-10-CM

## 2017-03-30 NOTE — Patient Instructions (Signed)
Medication Instructions:   No medication changes made  Ok to hold the coreg and mircardis  Labwork:  No new labs needed  Testing/Procedures:  No further testing at this time   I recommend watching educational videos on topics of interest to you at:       [email or web address]  Enter code: HEARTCARE    Follow-Up: It was a pleasure seeing you in the office today. Please call us if you have new issues that need to be addressed before your next appt.  [phone]  Your physician wants you to follow-up in: 6 months.  You will receive a reminder letter in the mail two months in advance. If you don't receive a letter, please call our office to schedule the follow-up appointment.  If you need a refill on your cardiac medications before your next appointment, please call your pharmacy.

## 2017-03-30 NOTE — Progress Notes (Signed)
Cardiology Office Note  Date:  03/30/2017   ID:  Bobby Tanner, DOB 1951-09-26, MRN [id number]  PCP:  Rusty Aus, MD   Chief Complaint  Patient presents with  . OTHER    6 month f/u c/o chest pain, sob and edema . Pt would like to discuss medications. Meds reviewed verbally with pt.    HPI:  66 year old gentleman with history of  Anxiety/stress at work acute anterior ST elevation MI s/p PCI to the proximal LAD with VF arrest requiring chest compressions 04/10/2016,   residual 50% diagonal disease discharged on 04/12/2016  with follow-up today for his coronary artery disease, STEMI   In follow-up today he reports having some chest pain Typically presents at rest, sometimes in the evening More recently has been all day long He attributes this chest pain symptoms to his medications He stopped his carvedilol and myocarditis 2 days ago and reports that he feels better Denies having significant chest pain He does not monitor his blood pressure at home No exercise program  He had a stress test 11/28/2016 showing no ischemia Work stress When he thinks about things that need to be done, he develops shortness of breath, vision issues, chest tightness  On his last clinic visit he reported having periodic episodes of chest tightness, blurry vision, shortness of breath.  he called the office over the holidays late December 2017 He was referred  to the emergency room 11/13/16 Vision issues seem to stay for a few days Neg D-dimer, normal BNP in the hospital  TNT normal, BMP ok He had a stress test showing no ischemia .   After his MI, went back to work, took on a full load  Lab work reviewed  HBA1C 6.3 Total cholesterol 115  He did participate in cardiac rehabilitation, insurance changed and he was no  longer able to afford it  EKG personally reviewed by myself on todays visit Shows normal sinus rhythm with rate 60 bpm no significant ST or T-wave changes  Other past medical  history reviewed with him Previous Echo showed EF 50-55%, akinesis of the apical myocardium, hypokinesis of the anteroseptal myocardium, LV diastolic function was normal, mild MR, RV systolic function was normal, PASP was normal.   Cardiac catheterization 04/10/2016: 1st Diag lesion, 50% stenosed. Prox LAD lesion, 99% stenosed. Post intervention, there is a 0% residual stenosis. The lesion was not previously treated.The left ventricular systolic function is normal.  1. Severe one-vessel coronary artery disease with heavy thrombotic lesion in the proximal LAD. Otherwise no obstructive disease. 2. Mildly reduced LV systolic function with mild anterior wall hypokinesis. Moderately to severely elevated left ventricular end-diastolic pressure. 3. Successful aspiration thrombectomy and drug-eluting stent placement to the proximal LAD.  PMH:   has a past medical history of Arthritis; Asthma; CAD (coronary artery disease); Cardiac arrest with ventricular fibrillation ([place]); Cataract; Diet-controlled diabetes mellitus ([place]); Essential hypertension; GERD (gastroesophageal reflux disease); Hemorrhoids; HOH (hard of hearing); Hyperlipidemia; Ischemic cardiomyopathy; Morbid obesity ([place]); Obstructive sleep apnea; Orthopnea; Skin cancer; and ST elevation myocardial infarction (STEMI) of anterior wall ([place]) (04/10/2016).  PSH:    Past Surgical History:  Procedure Laterality Date  . CARDIAC CATHETERIZATION N/A 04/10/2016   Procedure: Left Heart Cath and Coronary Angiography;  Surgeon: Wellington Hampshire, MD;  Location: [facility];  Service: Cardiovascular;  Laterality: N/A;  . CARDIAC CATHETERIZATION N/A 04/10/2016   Procedure: Coronary Stent Intervention;  Surgeon: Wellington Hampshire, MD;  Location: [facility];  Service:  Cardiovascular;  Laterality: N/A;  . CATARACT EXTRACTION W/PHACO Right 04/03/2016   Procedure: CATARACT EXTRACTION PHACO AND INTRAOCULAR LENS PLACEMENT (IOC);  Surgeon: Estill Cotta, MD;  Location: [facility];  Service: Ophthalmology;  Laterality: Right;  [place] 01:13 AP% 25.8 CDE 34.18 fluid pack lot #[id number] H  . COLONOSCOPY  2014  . skin cancers     head    Current Outpatient Prescriptions  Medication Sig Dispense Refill  . albuterol (PROAIR HFA) 108 (90 Base) MCG/ACT inhaler Inhale into the lungs every 6 (six) hours as needed for wheezing or shortness of breath.    Marland Kitchen aspirin EC 81 MG tablet Take 81 mg by mouth daily.    Marland Kitchen atorvastatin (LIPITOR) 80 MG tablet Take 1 tablet (80 mg total) by mouth daily at 6 PM. 30 tablet 11  . clopidogrel (PLAVIX) 75 MG tablet Take 1 tablet (75 mg total) by mouth daily. 30 tablet 6  . fluticasone (FLONASE) 50 MCG/ACT nasal spray Place 2 sprays into both nostrils daily. Reported on 05/01/2016    . furosemide (LASIX) 20 MG tablet Take 1 tablet (20 mg total) by mouth daily as needed. 30 tablet 6  . lansoprazole (PREVACID) 30 MG capsule Take 30 mg by mouth daily.    . nitroGLYCERIN (NITROSTAT) 0.4 MG SL tablet Place 1 tablet (0.4 mg total) under the tongue every 5 (five) minutes x 3 doses as needed for chest pain. 25 tablet 12  . potassium chloride (K-DUR) 10 MEQ tablet Take 1 tablet (10 mEq total) by mouth daily as needed. 30 tablet 6  . sildenafil (REVATIO) 20 MG tablet Take 1 tablet (20 mg total) by mouth 3 (three) times daily as needed. 90 tablet 3  . zolpidem (AMBIEN) 10 MG tablet Take 5 mg by mouth at bedtime as needed for sleep.     No current facility-administered medications for this visit.      Allergies:   Patient has no known allergies.   Social History:  The patient  reports that he has never smoked. He has never used smokeless tobacco. He reports that he drinks alcohol. He reports that he does not use drugs.   Family History:   family history includes Alzheimer's disease in his mother; CAD in his father; Hypertension in his brother, father, and paternal grandfather.    Review of Systems: Review of Systems   Constitutional: Negative.   Respiratory: Negative.   Cardiovascular: Positive for chest pain.  Gastrointestinal: Negative.   Musculoskeletal: Negative.   Neurological: Negative.   Psychiatric/Behavioral: Negative.   All other systems reviewed and are negative.    PHYSICAL EXAM: VS:  BP (!) 144/84 (BP Location: Left Arm, Patient Position: Sitting, Cuff Size: Large)   Pulse 60   Ht 6' (1.829 m)   Wt (!) 301 lb 4 oz (136.6 kg)   BMI 40.86 kg/m  , BMI Body mass index is 40.86 kg/m. GEN: Well nourished, well developed, in no acute distress, obese  HEENT: normal  Neck: no JVD, carotid bruits, or masses Cardiac: RRR; no murmurs, rubs, or gallops,no edema  Respiratory:  clear to auscultation bilaterally, normal work of breathing GI: soft, nontender, nondistended, + BS MS: no deformity or atrophy  Skin: warm and dry, no rash Neuro:  Strength and sensation are intact Psych: euthymic mood, full affect    Recent Labs: 04/10/2016: TSH 2.357 11/13/2016: ALT 23; BUN 12; Creatinine, Ser 0.99; Hemoglobin 14.8; Platelets 188; Potassium 3.8; Sodium 137 11/14/2016: BNP 16.0    Lipid Panel Lab Results  Component Value Date   CHOL 189 04/11/2016   HDL 37 (L) 04/11/2016   LDLCALC 132 (H) 04/11/2016   TRIG 100 04/11/2016      Wt Readings from Last 3 Encounters:  03/30/17 (!) 301 lb 4 oz (136.6 kg)  12/06/16 (!) 303 lb 12 oz (137.8 kg)  11/14/16 (!) 300 lb 8 oz (136.3 kg)       ASSESSMENT AND PLAN:  Cardiac arrest ([place]) - Plan: EKG 12-Lead  Essential hypertension - Plan: EKG 12-Lead As below he feels better by holding carvedilol and micardis Suggested he monitor blood pressure at home  ST elevation myocardial infarction involving left anterior descending (LAD) coronary artery ([place]) - Plan: EKG 12-Lead Recent negative stress test No further workup needed  Chest pain, unspecified type - Plan: EKG 12-Lead Atypical in nature Recent negative stress test Symptoms previously  felt to be secondary to work stress He reports improved symptoms by holding his carvedilol and myocardis We will continue to hold these medications  Morbid obesity We have encouraged continued exercise, careful diet management in an effort to lose weight.    Total encounter time more than 25 minutes  Greater than 50% was spent in counseling and coordination of care with the patient  Disposition:   F/U  6 months   Orders Placed This Encounter  Procedures  . EKG 12-Lead     Signed, Esmond Plants, M.D., Ph.D. 03/30/2017  [facility] [place], [place] [phone]

## 2017-04-10 ENCOUNTER — Ambulatory Visit: Payer: BLUE CROSS/BLUE SHIELD | Admitting: Cardiovascular Disease

## 2017-04-16 ENCOUNTER — Ambulatory Visit (INDEPENDENT_AMBULATORY_CARE_PROVIDER_SITE_OTHER): Payer: Medicare Other | Admitting: Internal Medicine

## 2017-04-16 ENCOUNTER — Encounter: Payer: Self-pay | Admitting: Internal Medicine

## 2017-04-16 VITALS — BP 178/90 | HR 60 | Resp 16 | Ht 72.0 in | Wt 305.0 lb

## 2017-04-16 DIAGNOSIS — G4733 Obstructive sleep apnea (adult) (pediatric): Secondary | ICD-10-CM

## 2017-04-16 DIAGNOSIS — R0602 Shortness of breath: Secondary | ICD-10-CM

## 2017-04-16 NOTE — Patient Instructions (Signed)
Assess OSA with OLD reports or Obtain NEw sleep study Weight loss recommended Follow up cardiology as needed

## 2017-04-16 NOTE — Progress Notes (Signed)
Name: Bobby Tanner MRN: [id number] DOB: Mar 19, 1951   CONSULTATION DATE:04/16/2017   REFERRING MD :Dr. Sabra Heck  CHIEF COMPLAINT: sleep apnea    HISTORY OF PRESENT ILLNESS: 66 yo Dx with OSA >5 years, takes ambien-->having problems with insurance and DME companies I will need download to assess compliance  His OSA is well under control according to patient He does really well with his CPAP machine  Childhood ASTHMA-on Symbicort in the past, now well controlled with albuterol as needed only Has been using more frequent  in last 2 months due to weather and pollen, but now under control  Cardiac Stent 1 year ago-sees Dr Rockey Situ, on ASA/plavix lipitor stopped caused some type of allergic reaction and chest discomfort  No signs of infection at this time   PAST MEDICAL HISTORY :   has a past medical history of Arthritis; Asthma; CAD (coronary artery disease); Cardiac arrest with ventricular fibrillation ([place]); Cataract; Diet-controlled diabetes mellitus ([place]); Essential hypertension; GERD (gastroesophageal reflux disease); Hemorrhoids; HOH (hard of hearing); Hyperlipidemia; Ischemic cardiomyopathy; Morbid obesity ([place]); Obstructive sleep apnea; Orthopnea; Skin cancer; and ST elevation myocardial infarction (STEMI) of anterior wall ([place]) (04/10/2016).  has a past surgical history that includes skin cancers; Colonoscopy (2014); Cataract extraction w/PHACO (Right, 04/03/2016); Cardiac catheterization (N/A, 04/10/2016); and Cardiac catheterization (N/A, 04/10/2016). Prior to Admission medications   Medication Sig Start Date End Date Taking? Authorizing Provider  albuterol (PROAIR HFA) 108 (90 Base) MCG/ACT inhaler Inhale into the lungs every 6 (six) hours as needed for wheezing or shortness of breath.    [provider]  aspirin EC 81 MG tablet Take 81 mg by mouth daily.    [provider]  atorvastatin (LIPITOR) 80 MG tablet Take 1 tablet (80 mg total) by mouth daily at 6 PM.  04/12/16   Dunn, Areta Haber, PA-C  clopidogrel (PLAVIX) 75 MG tablet Take 1 tablet (75 mg total) by mouth daily. 11/14/16   Rogelia Mire, NP  fluticasone (FLONASE) 50 MCG/ACT nasal spray Place 2 sprays into both nostrils daily. Reported on 05/01/2016    [provider]  furosemide (LASIX) 20 MG tablet Take 1 tablet (20 mg total) by mouth daily as needed. 04/18/16   Minna Merritts, MD  lansoprazole (PREVACID) 30 MG capsule Take 30 mg by mouth daily.    [provider]  nitroGLYCERIN (NITROSTAT) 0.4 MG SL tablet Place 1 tablet (0.4 mg total) under the tongue every 5 (five) minutes x 3 doses as needed for chest pain. 04/12/16   Dunn, Areta Haber, PA-C  potassium chloride (K-DUR) 10 MEQ tablet Take 1 tablet (10 mEq total) by mouth daily as needed. 04/18/16   Minna Merritts, MD  sildenafil (REVATIO) 20 MG tablet Take 1 tablet (20 mg total) by mouth 3 (three) times daily as needed. 12/06/16   Minna Merritts, MD  zolpidem (AMBIEN) 10 MG tablet Take 5 mg by mouth at bedtime as needed for sleep.    [provider]   No Known Allergies  FAMILY HISTORY:  family history includes Alzheimer's disease in his mother; CAD in his father; Hypertension in his brother, father, and paternal grandfather. SOCIAL HISTORY:  reports that he has never smoked. He has never used smokeless tobacco. He reports that he drinks alcohol. He reports that he does not use drugs.  REVIEW OF SYSTEMS:   Constitutional: Negative for fever, chills, weight loss, malaise/fatigue and diaphoresis.  HENT: Negative for hearing loss, ear pain, nosebleeds, congestion, sore throat, neck pain,  tinnitus and ear discharge.   Eyes: Negative for blurred vision, double vision, photophobia, pain, discharge and redness.  Respiratory: Negative for cough, hemoptysis, sputum production, shortness of breath, wheezing and stridor.   Cardiovascular: Negative for chest pain, palpitations, orthopnea, claudication, leg swelling and  PND.  Gastrointestinal: Negative for heartburn, nausea, vomiting, abdominal pain, diarrhea, constipation, blood in stool and melena.  Genitourinary: Negative for dysuria, urgency, frequency, hematuria and flank pain.  Musculoskeletal: Negative for myalgias, back pain, joint pain and falls.  Skin: Negative for itching and rash.  Neurological: Negative for dizziness, tingling, tremors, sensory change, speech change, focal weakness, seizures, loss of consciousness, weakness and headaches.  Endo/Heme/Allergies: Negative for environmental allergies and polydipsia. Does not bruise/bleed easily.   VITAL SIGNS: BP (!) 178/90 (BP Location: Right Arm, Patient Position: Sitting, Cuff Size: Normal)   Pulse 60   Resp 16   Ht 6' (1.829 m)   Wt (!) 305 lb (138.3 kg)   SpO2 96%   BMI 41.37 kg/m    Physical Examination:   GENERAL:NAD, no fevers, chills, no weakness no fatigue HEAD: Normocephalic, atraumatic.  EYES: Pupils equal, round, reactive to light. Extraocular muscles intact. No scleral icterus.  MOUTH: Moist mucosal membrane. Dentition intact.  EAR, NOSE, THROAT: Clear without exudates.  NECK: Supple.  PULMONARY:CAT b/l no wheezes CARDIOVASCULAR: S1 and S2. Regular rate and rhythm. No murmurs, rubs, or gallops. No edema.  GASTROINTESTINAL: Soft, nontender, nondistended. No masses. Positive bowel sounds. No hepatosplenomegaly.  MUSCULOSKELETAL: No swelling, clubbing, or edema. Range of motion full in all extremities.  NEUROLOGIC: Cranial nerves II through XII are intact. No gross focal neurological deficits. Sensation intact. Reflexes intact.  SKIN: No ulceration, lesions, rashes, or cyanosis. Skin warm and dry. Turgor intact.  PSYCHIATRIC: Mood, affect within normal limits. The patient is awake, alert and oriented x 3. Insight, judgment intact.  ALL OTHER ROS ARE NEGATIVE  ASSESSMENT / PLAN: 66 yo obese white male with OSA and intermittent reactive airways disease with ASTHMA well  controlled  1.will need to obtain old OSA records and assess for new machine or new sleep study 2.ASTHM well controlled with albuterol as needed 3.follow up cardiology as needed 4.wt loss and diet and exercise 5.GERD continue prevacid  Follow in 2-3 months   Patient  satisfied with Plan of action and management. All questions answered  Corrin Parker, M.D.  [facility]  Medical Director [place] Director [facility] Cardio-Pulmonary Department

## 2017-04-19 ENCOUNTER — Other Ambulatory Visit: Payer: Self-pay | Admitting: *Deleted

## 2017-04-19 DIAGNOSIS — G4733 Obstructive sleep apnea (adult) (pediatric): Secondary | ICD-10-CM

## 2017-04-23 ENCOUNTER — Other Ambulatory Visit: Payer: Self-pay | Admitting: Nurse Practitioner

## 2017-05-14 ENCOUNTER — Encounter: Payer: Self-pay | Admitting: Internal Medicine

## 2017-05-15 ENCOUNTER — Other Ambulatory Visit: Payer: Self-pay | Admitting: Cardiovascular Disease

## 2017-05-23 ENCOUNTER — Other Ambulatory Visit: Payer: Self-pay | Admitting: Cardiovascular Disease

## 2017-06-06 ENCOUNTER — Encounter: Payer: Self-pay | Admitting: Internal Medicine

## 2017-06-06 DIAGNOSIS — G4733 Obstructive sleep apnea (adult) (pediatric): Secondary | ICD-10-CM

## 2017-06-12 ENCOUNTER — Encounter: Payer: Self-pay | Admitting: Cardiovascular Disease

## 2017-06-25 DIAGNOSIS — D485 Neoplasm of uncertain behavior of skin: Secondary | ICD-10-CM | POA: Diagnosis not present

## 2017-06-25 DIAGNOSIS — C4401 Basal cell carcinoma of skin of lip: Secondary | ICD-10-CM | POA: Diagnosis not present

## 2017-07-25 DIAGNOSIS — Z125 Encounter for screening for malignant neoplasm of prostate: Secondary | ICD-10-CM | POA: Diagnosis not present

## 2017-07-25 DIAGNOSIS — E782 Mixed hyperlipidemia: Secondary | ICD-10-CM | POA: Diagnosis not present

## 2017-07-25 DIAGNOSIS — Z0000 Encounter for general adult medical examination without abnormal findings: Secondary | ICD-10-CM | POA: Diagnosis not present

## 2017-07-25 DIAGNOSIS — R739 Hyperglycemia, unspecified: Secondary | ICD-10-CM | POA: Diagnosis not present

## 2017-08-01 DIAGNOSIS — E782 Mixed hyperlipidemia: Secondary | ICD-10-CM | POA: Diagnosis not present

## 2017-08-01 DIAGNOSIS — R739 Hyperglycemia, unspecified: Secondary | ICD-10-CM | POA: Diagnosis not present

## 2017-08-01 DIAGNOSIS — Z0000 Encounter for general adult medical examination without abnormal findings: Secondary | ICD-10-CM | POA: Diagnosis not present

## 2017-08-06 DIAGNOSIS — L908 Other atrophic disorders of skin: Secondary | ICD-10-CM | POA: Diagnosis not present

## 2017-08-06 DIAGNOSIS — L814 Other melanin hyperpigmentation: Secondary | ICD-10-CM | POA: Diagnosis not present

## 2017-08-06 DIAGNOSIS — L578 Other skin changes due to chronic exposure to nonionizing radiation: Secondary | ICD-10-CM | POA: Diagnosis not present

## 2017-08-06 DIAGNOSIS — C4401 Basal cell carcinoma of skin of lip: Secondary | ICD-10-CM | POA: Diagnosis not present

## 2017-08-16 ENCOUNTER — Telehealth: Payer: Self-pay | Admitting: Internal Medicine

## 2017-08-16 DIAGNOSIS — G4733 Obstructive sleep apnea (adult) (pediatric): Secondary | ICD-10-CM

## 2017-08-16 NOTE — Telephone Encounter (Signed)
[facility] calling stating pt would like a replacement CPAP  They need an order (if we agree) with setting sent to them  Please advise

## 2017-08-17 NOTE — Telephone Encounter (Signed)
Poke with Bobby Tanner and she states pt's CPAP is set at an auto 9-11cm h2O. Order placed for new CPAP.

## 2017-08-17 NOTE — Telephone Encounter (Signed)
LM for Ailene Ravel with [facility] to call me with what settings they have for the pt's CPAP.

## 2017-08-20 ENCOUNTER — Other Ambulatory Visit: Payer: Self-pay | Admitting: Cardiovascular Disease

## 2017-08-22 DIAGNOSIS — G4733 Obstructive sleep apnea (adult) (pediatric): Secondary | ICD-10-CM | POA: Diagnosis not present

## 2017-09-13 ENCOUNTER — Other Ambulatory Visit: Payer: Self-pay | Admitting: Cardiovascular Disease

## 2017-09-19 ENCOUNTER — Encounter: Payer: Self-pay | Admitting: Internal Medicine

## 2017-10-10 DIAGNOSIS — H903 Sensorineural hearing loss, bilateral: Secondary | ICD-10-CM | POA: Diagnosis not present

## 2017-10-10 DIAGNOSIS — J309 Allergic rhinitis, unspecified: Secondary | ICD-10-CM | POA: Diagnosis not present

## 2017-10-10 DIAGNOSIS — H6980 Other specified disorders of Eustachian tube, unspecified ear: Secondary | ICD-10-CM | POA: Diagnosis not present

## 2017-10-22 ENCOUNTER — Ambulatory Visit: Payer: BLUE CROSS/BLUE SHIELD | Admitting: Internal Medicine

## 2017-10-22 DIAGNOSIS — G4733 Obstructive sleep apnea (adult) (pediatric): Secondary | ICD-10-CM | POA: Diagnosis not present

## 2017-10-24 ENCOUNTER — Encounter: Payer: Self-pay | Admitting: Internal Medicine

## 2017-10-26 ENCOUNTER — Encounter: Payer: Self-pay | Admitting: Internal Medicine

## 2017-10-26 ENCOUNTER — Ambulatory Visit (INDEPENDENT_AMBULATORY_CARE_PROVIDER_SITE_OTHER): Payer: Medicare Other | Admitting: Internal Medicine

## 2017-10-26 VITALS — BP 128/76 | HR 73 | Ht 72.0 in | Wt 304.0 lb

## 2017-10-26 DIAGNOSIS — G4733 Obstructive sleep apnea (adult) (pediatric): Secondary | ICD-10-CM

## 2017-10-26 NOTE — Patient Instructions (Signed)
Continue CPAP as prescribed. 

## 2017-10-26 NOTE — Progress Notes (Signed)
   Name: Bobby Tanner MRN: [id number] DOB: 1951-01-29   CONSULTATION DATE:10/26/2017   REFERRING MD :Dr. Sabra Heck  CHIEF COMPLAINT: sleep apnea    HISTORY OF PRESENT ILLNESS:  His OSA is well under control according to patient He does really well with his CPAP machine  Childhood ASTHMA-on Symbicort in the past, now well controlled with albuterol as needed only  Cardiac Stent 1 year ago-sees Dr Rockey Situ, on ASA/plavix lipitor stopped caused some type of allergic reaction and chest discomfort  No signs of infection at this time No signs of CHF at this time   REVIEW OF SYSTEMS:   Constitutional: Negative for fever, chills, weight loss, malaise/fatigue and diaphoresis.  HENT: Negative for hearing loss, ear pain, nosebleeds, congestion, sore throat, neck pain, tinnitus and ear discharge.   Eyes: Negative for blurred vision, double vision, photophobia, pain, discharge and redness.  Respiratory: Negative for cough, hemoptysis, sputum production, shortness of breath, wheezing and stridor.   Cardiovascular: Negative for chest pain, palpitations, orthopnea, claudication, leg swelling and PND.  Gastrointestinal: Negative for heartburn, nausea, vomiting, abdominal pain, diarrhea, constipation, blood in stool and melena.  Genitourinary: Negative for dysuria, urgency, frequency, hematuria and flank pain.  Musculoskeletal: Negative for myalgias, back pain, joint pain and falls.  Skin: Negative for itching and rash.  Neurological: Negative for dizziness, tingling, tremors, sensory change, speech change, focal weakness, seizures, loss of consciousness, weakness and headaches.  Endo/Heme/Allergies: Negative for environmental allergies and polydipsia. Does not bruise/bleed easily.   VITAL SIGNS: BP 128/76 (BP Location: Left Arm, Cuff Size: Large)   Pulse 73   Ht 6' (1.829 m)   Wt (!) 304 lb (137.9 kg)   SpO2 95%   BMI 41.23 kg/m    Physical Examination:   GENERAL:NAD, no fevers, chills,  no weakness no fatigue HEAD: Normocephalic, atraumatic.  EYES: Pupils equal, round, reactive to light. Extraocular muscles intact. No scleral icterus.  MOUTH: Moist mucosal membrane. Dentition intact.  EAR, NOSE, THROAT: Clear without exudates.  NECK: Supple.  PULMONARY:CAT b/l no wheezes CARDIOVASCULAR: S1 and S2. Regular rate and rhythm. No murmurs, rubs, or gallops. No edema.  GASTROINTESTINAL: Soft, nontender, nondistended. No masses. Positive bowel sounds. No hepatosplenomegaly.  MUSCULOSKELETAL: No swelling, clubbing, or edema. Range of motion full in all extremities.  NEUROLOGIC: Cranial nerves II through XII are intact. No gross focal neurological deficits. Sensation intact. Reflexes intact.  SKIN: No ulceration, lesions, rashes, or cyanosis. Skin warm and dry. Turgor intact.  PSYCHIATRIC: Mood, affect within normal limits. The patient is awake, alert and oriented x 3. Insight, judgment intact.  ALL OTHER ROS ARE NEGATIVE  ASSESSMENT / PLAN: 66 yo obese white male with OSA and intermittent reactive airways disease with ASTHMA well controlled  -patient very compliant with OSA therapy and doing well and tolerating and benefitting well from therapy  -ASTHM well controlled with albuterol as needed  -follow up cardiology as needed  Obesity -recommend significant weight loss -recommend changing diet  Deconditioned state -Recommend increased daily activity and exercise    Follow in 1 year   Patient  satisfied with Plan of action and management. All questions answered  Corrin Parker, M.D.  [facility]  Medical Director [place] Director [facility] Cardio-Pulmonary Department

## 2017-11-12 ENCOUNTER — Other Ambulatory Visit: Payer: Self-pay | Admitting: Cardiovascular Disease

## 2017-11-14 ENCOUNTER — Other Ambulatory Visit: Payer: Self-pay | Admitting: Ophthalmology

## 2017-11-14 DIAGNOSIS — H532 Diplopia: Secondary | ICD-10-CM

## 2017-11-14 DIAGNOSIS — E119 Type 2 diabetes mellitus without complications: Secondary | ICD-10-CM | POA: Diagnosis not present

## 2017-11-26 DIAGNOSIS — G4733 Obstructive sleep apnea (adult) (pediatric): Secondary | ICD-10-CM | POA: Diagnosis not present

## 2017-11-28 ENCOUNTER — Ambulatory Visit: Payer: BLUE CROSS/BLUE SHIELD

## 2017-11-28 DIAGNOSIS — G4733 Obstructive sleep apnea (adult) (pediatric): Secondary | ICD-10-CM | POA: Diagnosis not present

## 2017-12-03 DIAGNOSIS — R319 Hematuria, unspecified: Secondary | ICD-10-CM | POA: Diagnosis not present

## 2017-12-03 DIAGNOSIS — N410 Acute prostatitis: Secondary | ICD-10-CM | POA: Diagnosis not present

## 2017-12-03 DIAGNOSIS — R310 Gross hematuria: Secondary | ICD-10-CM | POA: Diagnosis not present

## 2017-12-14 ENCOUNTER — Other Ambulatory Visit: Payer: Self-pay | Admitting: Cardiovascular Disease

## 2017-12-18 DIAGNOSIS — R310 Gross hematuria: Secondary | ICD-10-CM | POA: Diagnosis not present

## 2017-12-22 DIAGNOSIS — G4733 Obstructive sleep apnea (adult) (pediatric): Secondary | ICD-10-CM | POA: Diagnosis not present

## 2017-12-27 ENCOUNTER — Ambulatory Visit: Payer: BLUE CROSS/BLUE SHIELD | Admitting: Cardiovascular Disease

## 2018-01-14 ENCOUNTER — Other Ambulatory Visit: Payer: Self-pay | Admitting: Cardiovascular Disease

## 2018-01-22 DIAGNOSIS — G4733 Obstructive sleep apnea (adult) (pediatric): Secondary | ICD-10-CM | POA: Diagnosis not present

## 2018-02-08 ENCOUNTER — Encounter: Payer: Self-pay | Admitting: Cardiovascular Disease

## 2018-02-08 ENCOUNTER — Ambulatory Visit (INDEPENDENT_AMBULATORY_CARE_PROVIDER_SITE_OTHER): Payer: BLUE CROSS/BLUE SHIELD | Admitting: Cardiovascular Disease

## 2018-02-08 VITALS — BP 126/60 | HR 71 | Ht 72.0 in | Wt 301.8 lb

## 2018-02-08 DIAGNOSIS — I2102 ST elevation (STEMI) myocardial infarction involving left anterior descending coronary artery: Secondary | ICD-10-CM | POA: Diagnosis not present

## 2018-02-08 DIAGNOSIS — R079 Chest pain, unspecified: Secondary | ICD-10-CM

## 2018-02-08 DIAGNOSIS — R0602 Shortness of breath: Secondary | ICD-10-CM

## 2018-02-08 MED ORDER — POTASSIUM CHLORIDE ER 10 MEQ PO TBCR: 10.0000 meq | EXTENDED_RELEASE_TABLET | Freq: Every day | ORAL | 3 refills | Status: DC | PRN

## 2018-02-08 MED ORDER — NITROGLYCERIN 0.4 MG SL SUBL: 0.4000 mg | SUBLINGUAL_TABLET | SUBLINGUAL | 2 refills | Status: DC | PRN

## 2018-02-08 MED ORDER — ATORVASTATIN CALCIUM 80 MG PO TABS: 80.0000 mg | ORAL_TABLET | Freq: Every day | ORAL | 11 refills | Status: DC

## 2018-02-08 MED ORDER — LOSARTAN POTASSIUM 50 MG PO TABS: 50.0000 mg | ORAL_TABLET | Freq: Every day | ORAL | 3 refills | Status: DC

## 2018-02-08 MED ORDER — CLOPIDOGREL BISULFATE 75 MG PO TABS: 75.0000 mg | ORAL_TABLET | Freq: Every day | ORAL | 3 refills | Status: DC

## 2018-02-08 MED ORDER — FUROSEMIDE 20 MG PO TABS: 20.0000 mg | ORAL_TABLET | Freq: Every day | ORAL | 3 refills | Status: DC | PRN

## 2018-02-08 NOTE — Patient Instructions (Signed)

## 2018-02-08 NOTE — Progress Notes (Signed)
Cardiology Office Note  Date:  02/08/2018   ID:  Bobby Tanner, DOB [birth year], MRN [id number]  PCP:  Rusty Aus, MD   Chief Complaint  Patient presents with  . other    6 month f/u no complaints today. Meds reviewed verbally with pt.    HPI:  67 year old gentleman with history of  Anxiety/stress at work acute anterior ST elevation MI  s/p PCI to the proximal LAD  VF arrest requiring chest compressions 04/10/2016,   residual 50% diagonal disease discharged on 04/12/2016  with follow-up today for his coronary artery disease, STEMI   In follow-up he reports doing relatively well  working long hours, no regular exercise program He denies having significant chest pain symptoms  Previously had fatigue He stopped his carvedilol and micardis Stopped the Medication and felt better   stress test 11/28/2016 showing no ischemia Previously reported when he thinks about things that need to be done, he develops shortness of breath, vision issues, chest tightness   He did participate in cardiac rehabilitation, insurance changed and he was no  longer able to afford it  EKG personally reviewed by myself on todays visit Shows normal sinus rhythm with rate 71 bpm no significant ST or T-wave changes  Other past medical history reviewed with him Previous Echo showed EF 50-55%, akinesis of the apical myocardium, hypokinesis of the anteroseptal myocardium, LV diastolic function was normal, mild MR, RV systolic function was normal, PASP was normal.   Cardiac catheterization 04/10/2016: 1st Diag lesion, 50% stenosed. Prox LAD lesion, 99% stenosed. Post intervention, there is a 0% residual stenosis. The lesion was not previously treated.The left ventricular systolic function is normal.  1. Severe one-vessel coronary artery disease with heavy thrombotic lesion in the proximal LAD. Otherwise no obstructive disease. 2. Mildly reduced LV systolic function with mild anterior wall hypokinesis.  Moderately to severely elevated left ventricular end-diastolic pressure. 3. Successful aspiration thrombectomy and drug-eluting stent placement to the proximal LAD.  PMH:   has a past medical history of Arthritis, Asthma, CAD (coronary artery disease), Cardiac arrest with ventricular fibrillation ([place]), Cataract, Diet-controlled diabetes mellitus ([place]), Essential hypertension, GERD (gastroesophageal reflux disease), Hemorrhoids, HOH (hard of hearing), Hyperlipidemia, Ischemic cardiomyopathy, Morbid obesity ([place]), Obstructive sleep apnea, Orthopnea, Skin cancer, and ST elevation myocardial infarction (STEMI) of anterior wall ([place]) (04/10/2016).  PSH:    Past Surgical History:  Procedure Laterality Date  . CARDIAC CATHETERIZATION N/A 04/10/2016   Procedure: Left Heart Cath and Coronary Angiography;  Surgeon: Wellington Hampshire, MD;  Location: [facility];  Service: Cardiovascular;  Laterality: N/A;  . CARDIAC CATHETERIZATION N/A 04/10/2016   Procedure: Coronary Stent Intervention;  Surgeon: Wellington Hampshire, MD;  Location: [facility];  Service: Cardiovascular;  Laterality: N/A;  . CATARACT EXTRACTION W/PHACO Right 04/03/2016   Procedure: CATARACT EXTRACTION PHACO AND INTRAOCULAR LENS PLACEMENT (IOC);  Surgeon: Estill Cotta, MD;  Location: [facility];  Service: Ophthalmology;  Laterality: Right;  [place] 01:13 AP% 25.8 CDE 34.18 fluid pack lot #[id number] H  . COLONOSCOPY  2014  . skin cancers     head    Current Outpatient Medications  Medication Sig Dispense Refill  . albuterol (PROAIR HFA) 108 (90 Base) MCG/ACT inhaler Inhale into the lungs every 6 (six) hours as needed for wheezing or shortness of breath.    Marland Kitchen aspirin EC 81 MG tablet Take 81 mg by mouth daily.    Marland Kitchen atorvastatin (LIPITOR) 80 MG tablet Take 1 tablet (80 mg total) by  mouth daily at 6 PM. 30 tablet 11  . clopidogrel (PLAVIX) 75 MG tablet TAKE 1 TABLET BY MOUTH DAILY 30 tablet 3  . fluticasone (FLONASE) 50 MCG/ACT nasal  spray Place 2 sprays into both nostrils daily. Reported on 05/01/2016    . furosemide (LASIX) 20 MG tablet TAKE 1 TABLET BY MOUTH DAILY AS NEEDED 30 tablet 0  . lansoprazole (PREVACID) 30 MG capsule Take 30 mg by mouth daily.    Marland Kitchen losartan (COZAAR) 50 MG tablet Take 50 mg by mouth daily.    . nitroGLYCERIN (NITROSTAT) 0.4 MG SL tablet Place 1 tablet (0.4 mg total) under the tongue every 5 (five) minutes x 3 doses as needed for chest pain. 25 tablet 12  . potassium chloride (K-DUR) 10 MEQ tablet TAKE 1 TABLET BY MOUTH DAILY AS NEEDED 30 tablet 3  . sildenafil (REVATIO) 20 MG tablet Take 1 tablet (20 mg total) by mouth 3 (three) times daily as needed. 90 tablet 3  . zolpidem (AMBIEN) 10 MG tablet Take 5 mg by mouth at bedtime as needed for sleep.     No current facility-administered medications for this visit.      Allergies:   Other and Pollen extract   Social History:  The patient  reports that  has never smoked. he has never used smokeless tobacco. He reports that he drinks alcohol. He reports that he does not use drugs.   Family History:   family history includes Alzheimer's disease in his mother; CAD in his father; Hypertension in his brother, father, and paternal grandfather.    Review of Systems: Review of Systems  Constitutional: Negative.   Respiratory: Negative.   Cardiovascular: Negative.   Gastrointestinal: Negative.   Musculoskeletal: Negative.   Neurological: Negative.   Psychiatric/Behavioral: Negative.   All other systems reviewed and are negative.    PHYSICAL EXAM: VS:  BP 126/60 (BP Location: Right Arm, Patient Position: Sitting, Cuff Size: Large)   Pulse 71   Ht 6' (1.829 m)   Wt (!) 301 lb 12 oz (136.9 kg)   BMI 40.92 kg/m  , BMI Body mass index is 40.92 kg/m. Constitutional:  oriented to person, place, and time. No distress.  HENT:  Head: Normocephalic and atraumatic.  Eyes:  no discharge. No scleral icterus.  Neck: Normal range of motion. Neck supple. No  JVD present.  Cardiovascular: Normal rate, regular rhythm, normal heart sounds and intact distal pulses. Exam reveals no gallop and no friction rub. No edema No murmur heard. Pulmonary/Chest: Effort normal and breath sounds normal. No stridor. No respiratory distress.  no wheezes.  no rales.  no tenderness.  Abdominal: Soft.  no distension.  no tenderness.  Musculoskeletal: Normal range of motion.  no  tenderness or deformity.  Neurological:  normal muscle tone. Coordination normal. No atrophy Skin: Skin is warm and dry. No rash noted. not diaphoretic.  Psychiatric:  normal mood and affect. behavior is normal. Thought content normal.      Recent Labs: No results found for requested labs within last 8760 hours.    Lipid Panel Lab Results  Component Value Date   CHOL 189 04/11/2016   HDL 37 (L) 04/11/2016   LDLCALC 132 (H) 04/11/2016   TRIG 100 04/11/2016      Wt Readings from Last 3 Encounters:  02/08/18 (!) 301 lb 12 oz (136.9 kg)  10/26/17 (!) 304 lb (137.9 kg)  04/16/17 (!) 305 lb (138.3 kg)       ASSESSMENT AND PLAN:  Cardiac arrest [facility]) - Plan: EKG 12-Lead Stable following events from 2017 No further testing at this time  Essential hypertension - Plan: EKG 12-Lead Previously held carvedilol and micardis for malaise tolerating losartan  ST elevation myocardial infarction involving left anterior descending (LAD) coronary artery ([place]) - Plan: EKG 12-Lead negative stress test 2018 No further workup needed , denies anginal symptoms  Chest pain, unspecified type - Plan: EKG 12-Lead Atypical in nature Recent negative stress test Symptoms previously felt to be secondary to work stress He reports improved symptoms by holding his carvedilol and myocardis We will continue to hold these medications  Morbid obesity We have encouraged continued exercise, careful diet management in an effort to lose weight. Difficulty exercising given long work schedule    Total  encounter time more than 25 minutes  Greater than 50% was spent in counseling and coordination of care with the patient  Disposition:   F/U  12 months   Orders Placed This Encounter  Procedures  . EKG 12-Lead     Signed, Esmond Plants, M.D., Ph.D. 02/08/2018  [place], [place]

## 2018-02-15 ENCOUNTER — Other Ambulatory Visit: Payer: Self-pay | Admitting: Cardiovascular Disease

## 2018-02-18 ENCOUNTER — Encounter: Payer: Self-pay | Admitting: Cardiovascular Disease

## 2018-02-19 DIAGNOSIS — G4733 Obstructive sleep apnea (adult) (pediatric): Secondary | ICD-10-CM | POA: Diagnosis not present

## 2018-03-01 DIAGNOSIS — G4733 Obstructive sleep apnea (adult) (pediatric): Secondary | ICD-10-CM | POA: Diagnosis not present

## 2018-03-05 ENCOUNTER — Ambulatory Visit: Admission: RE | Admit: 2018-03-05 | Payer: BLUE CROSS/BLUE SHIELD | Source: Ambulatory Visit

## 2018-03-22 DIAGNOSIS — G4733 Obstructive sleep apnea (adult) (pediatric): Secondary | ICD-10-CM | POA: Diagnosis not present

## 2018-04-01 DIAGNOSIS — E782 Mixed hyperlipidemia: Secondary | ICD-10-CM | POA: Diagnosis not present

## 2018-04-01 DIAGNOSIS — R739 Hyperglycemia, unspecified: Secondary | ICD-10-CM | POA: Diagnosis not present

## 2018-04-08 DIAGNOSIS — E1169 Type 2 diabetes mellitus with other specified complication: Secondary | ICD-10-CM | POA: Diagnosis not present

## 2018-04-08 DIAGNOSIS — Z125 Encounter for screening for malignant neoplasm of prostate: Secondary | ICD-10-CM | POA: Diagnosis not present

## 2018-04-08 DIAGNOSIS — E782 Mixed hyperlipidemia: Secondary | ICD-10-CM | POA: Diagnosis not present

## 2018-05-07 DIAGNOSIS — H2512 Age-related nuclear cataract, left eye: Secondary | ICD-10-CM | POA: Diagnosis not present

## 2018-05-31 DIAGNOSIS — H26491 Other secondary cataract, right eye: Secondary | ICD-10-CM | POA: Diagnosis not present

## 2018-06-05 DIAGNOSIS — H2512 Age-related nuclear cataract, left eye: Secondary | ICD-10-CM | POA: Diagnosis not present

## 2018-06-12 ENCOUNTER — Encounter: Payer: Self-pay | Admitting: *Deleted

## 2018-06-12 ENCOUNTER — Other Ambulatory Visit: Payer: Self-pay

## 2018-06-12 NOTE — Discharge Instructions (Signed)

## 2018-06-14 DIAGNOSIS — H43392 Other vitreous opacities, left eye: Secondary | ICD-10-CM | POA: Diagnosis not present

## 2018-06-18 ENCOUNTER — Ambulatory Visit: Payer: BLUE CROSS/BLUE SHIELD | Admitting: Anesthesiology

## 2018-06-18 ENCOUNTER — Encounter
Admission: RE | Disposition: A | Discharge status: Home / Self Care | Payer: Self-pay | Source: Ambulatory Visit | Attending: Ophthalmology

## 2018-06-18 ENCOUNTER — Ambulatory Visit
Admission: RE | Admit: 2018-06-18 | Discharge: 2018-06-18 | Disposition: A | Discharge status: Home / Self Care | Payer: BLUE CROSS/BLUE SHIELD | Source: Ambulatory Visit | Attending: Ophthalmology | Admitting: Ophthalmology

## 2018-06-18 DIAGNOSIS — G4730 Sleep apnea, unspecified: Secondary | ICD-10-CM | POA: Diagnosis not present

## 2018-06-18 DIAGNOSIS — Z6841 Body Mass Index (BMI) 40.0 and over, adult: Secondary | ICD-10-CM | POA: Insufficient documentation

## 2018-06-18 DIAGNOSIS — H2512 Age-related nuclear cataract, left eye: Secondary | ICD-10-CM | POA: Insufficient documentation

## 2018-06-18 DIAGNOSIS — I10 Essential (primary) hypertension: Secondary | ICD-10-CM | POA: Diagnosis not present

## 2018-06-18 DIAGNOSIS — I2510 Atherosclerotic heart disease of native coronary artery without angina pectoris: Secondary | ICD-10-CM | POA: Diagnosis not present

## 2018-06-18 DIAGNOSIS — Z85828 Personal history of other malignant neoplasm of skin: Secondary | ICD-10-CM | POA: Insufficient documentation

## 2018-06-18 DIAGNOSIS — I252 Old myocardial infarction: Secondary | ICD-10-CM | POA: Diagnosis not present

## 2018-06-18 DIAGNOSIS — H25812 Combined forms of age-related cataract, left eye: Secondary | ICD-10-CM | POA: Diagnosis not present

## 2018-06-18 DIAGNOSIS — Z955 Presence of coronary angioplasty implant and graft: Secondary | ICD-10-CM | POA: Diagnosis not present

## 2018-06-18 HISTORY — PX: CATARACT EXTRACTION W/PHACO: SHX586

## 2018-06-18 HISTORY — DX: Presence of external hearing-aid: Z97.4

## 2018-06-18 LAB — GLUCOSE, CAPILLARY
GLUCOSE-CAPILLARY: 124 mg/dL — AB (ref 70–99)
Glucose-Capillary: 110 mg/dL — ABNORMAL HIGH (ref 70–99)

## 2018-06-18 SURGERY — PHACOEMULSIFICATION, CATARACT, WITH IOL INSERTION
Anesthesia: Monitor Anesthesia Care | Site: Eye | Laterality: Left | Wound class: "Clean "

## 2018-06-18 MED ORDER — MIDAZOLAM HCL 2 MG/2ML IJ SOLN
INTRAMUSCULAR | Status: DC | PRN
  Administered 2018-06-18: 2 mg via INTRAVENOUS

## 2018-06-18 MED ORDER — LACTATED RINGERS IV SOLN: 500.0000 mL | INTRAVENOUS | Status: DC

## 2018-06-18 MED ORDER — NA HYALUR & NA CHOND-NA HYALUR 0.4-0.35 ML IO KIT
PACK | INTRAOCULAR | Status: DC | PRN
  Administered 2018-06-18: 1 mL via INTRAOCULAR

## 2018-06-18 MED ORDER — CYCLOPENTOLATE HCL 2 % OP SOLN
1.0000 [drp] | OPHTHALMIC | Status: DC | PRN
  Administered 2018-06-18 (×3): 1 [drp] via OPHTHALMIC

## 2018-06-18 MED ORDER — EPINEPHRINE PF 1 MG/ML IJ SOLN
INTRAOCULAR | Status: DC | PRN
  Administered 2018-06-18: 72 mL via OPHTHALMIC

## 2018-06-18 MED ORDER — BRIMONIDINE TARTRATE-TIMOLOL 0.2-0.5 % OP SOLN
OPHTHALMIC | Status: DC | PRN
  Administered 2018-06-18: 1 [drp] via OPHTHALMIC

## 2018-06-18 MED ORDER — CEFUROXIME OPHTHALMIC INJECTION 1 MG/0.1 ML
INJECTION | OPHTHALMIC | Status: DC | PRN
  Administered 2018-06-18: 0.1 mL via OPHTHALMIC

## 2018-06-18 MED ORDER — PHENYLEPHRINE HCL 10 % OP SOLN
1.0000 [drp] | OPHTHALMIC | Status: DC | PRN
  Administered 2018-06-18 (×3): 1 [drp] via OPHTHALMIC

## 2018-06-18 MED ORDER — ACETAMINOPHEN 160 MG/5ML PO SOLN: 325.0000 mg | Freq: Once | ORAL | Status: AC

## 2018-06-18 MED ORDER — FENTANYL CITRATE (PF) 100 MCG/2ML IJ SOLN
INTRAMUSCULAR | Status: DC | PRN
  Administered 2018-06-18: 100 ug via INTRAVENOUS

## 2018-06-18 MED ORDER — LACTATED RINGERS IV SOLN: INTRAVENOUS | Status: DC

## 2018-06-18 MED ORDER — TETRACAINE HCL 0.5 % OP SOLN
1.0000 [drp] | OPHTHALMIC | Status: DC | PRN
  Administered 2018-06-18 (×2): 1 [drp] via OPHTHALMIC

## 2018-06-18 MED ORDER — ACETAMINOPHEN 325 MG PO TABS
325.0000 mg | ORAL_TABLET | Freq: Once | ORAL | Status: AC
  Administered 2018-06-18: 650 mg via ORAL

## 2018-06-18 MED ORDER — LIDOCAINE HCL (PF) 2 % IJ SOLN
INTRAOCULAR | Status: DC | PRN
  Administered 2018-06-18: 1 mL

## 2018-06-18 MED ORDER — MOXIFLOXACIN HCL 0.5 % OP SOLN
1.0000 [drp] | OPHTHALMIC | Status: DC | PRN
  Administered 2018-06-18 (×3): 1 [drp] via OPHTHALMIC

## 2018-06-18 SURGICAL SUPPLY — 27 items
CANNULA ANT/CHMB 27G (MISCELLANEOUS) ×1 IMPLANT
CANNULA ANT/CHMB 27GA (MISCELLANEOUS) ×3 IMPLANT
CARTRIDGE ABBOTT (MISCELLANEOUS) IMPLANT
GLOVE SURG LX 7.5 STRW (GLOVE) ×2
GLOVE SURG LX STRL 7.5 STRW (GLOVE) ×1 IMPLANT
GLOVE SURG TRIUMPH 8.0 PF LTX (GLOVE) ×3 IMPLANT
GOWN STRL REUS W/ TWL LRG LVL3 (GOWN DISPOSABLE) ×2 IMPLANT
GOWN STRL REUS W/TWL LRG LVL3 (GOWN DISPOSABLE) ×4
LENS IOL ACRYSOF IQ 21.0 (Intraocular Lens) ×2 IMPLANT
MARKER SKIN DUAL TIP RULER LAB (MISCELLANEOUS) ×3 IMPLANT
NDL FILTER BLUNT 18X1 1/2 (NEEDLE) ×1 IMPLANT
NDL RETROBULBAR .5 NSTRL (NEEDLE) IMPLANT
NEEDLE FILTER BLUNT 18X 1/2SAF (NEEDLE) ×2
NEEDLE FILTER BLUNT 18X1 1/2 (NEEDLE) ×1 IMPLANT
PACK CATARACT BRASINGTON (MISCELLANEOUS) ×3 IMPLANT
PACK EYE AFTER SURG (MISCELLANEOUS) ×3 IMPLANT
PACK OPTHALMIC (MISCELLANEOUS) ×3 IMPLANT
RING MALYGIN 7.0 (MISCELLANEOUS) IMPLANT
SUT ETHILON 10-0 CS-B-6CS-B-6 (SUTURE)
SUT VICRYL  9 0 (SUTURE)
SUT VICRYL 9 0 (SUTURE) IMPLANT
SUTURE EHLN 10-0 CS-B-6CS-B-6 (SUTURE) IMPLANT
SYR 3ML LL SCALE MARK (SYRINGE) ×3 IMPLANT
SYR 5ML LL (SYRINGE) ×3 IMPLANT
SYR TB 1ML LUER SLIP (SYRINGE) ×3 IMPLANT
WATER STERILE IRR 500ML POUR (IV SOLUTION) ×3 IMPLANT
WIPE NON LINTING 3.25X3.25 (MISCELLANEOUS) ×3 IMPLANT

## 2018-06-18 NOTE — Transfer of Care (Signed)
Immediate Anesthesia Transfer of Care Note  Patient: Bobby Tanner  Procedure(s) Performed: CATARACT EXTRACTION PHACO AND INTRAOCULAR LENS PLACEMENT (IOC) LEFT DIABETES IVA TOPICAL (Left Eye)  Patient Location: PACU  Anesthesia Type: MAC  Level of Consciousness: awake, alert  and patient cooperative  Airway and Oxygen Therapy: Patient Spontanous Breathing and Patient connected to supplemental oxygen  Post-op Assessment: Post-op Vital signs reviewed, Patient's Cardiovascular Status Stable, Respiratory Function Stable, Patent Airway and No signs of Nausea or vomiting  Post-op Vital Signs: Reviewed and stable  Complications: No apparent anesthesia complications

## 2018-06-18 NOTE — Op Note (Signed)
OPERATIVE NOTE  Bobby Tanner [id number] 06/18/2018   PREOPERATIVE DIAGNOSIS:  Nuclear sclerotic cataract left eye. H25.12   POSTOPERATIVE DIAGNOSIS:    Nuclear sclerotic cataract left eye.     PROCEDURE:  Phacoemusification with posterior chamber intraocular lens placement of the left eye   LENS:   Implant Name Type Inv. Item Serial No. Manufacturer Lot No. LRB No. Used  LENS IOL ACRYSOF IQ 21.0 - [id number] Intraocular Lens LENS IOL ACRYSOF IQ 21.0 [id number] ALCON  Left 1        ULTRASOUND TIME: 17  % of 1 minutes 15 seconds, CDE 12.8  SURGEON:  Wyonia Hough, MD   ANESTHESIA:  Topical with tetracaine drops and 2% Xylocaine jelly, augmented with 1% preservative-free intracameral lidocaine.    COMPLICATIONS:  None.   DESCRIPTION OF PROCEDURE:  The patient was identified in the holding room and transported to the operating room and placed in the supine position under the operating microscope.  The left eye was identified as the operative eye and it was prepped and draped in the usual sterile ophthalmic fashion.   A 1 millimeter clear-corneal paracentesis was made at the 1:30 position.  0.5 ml of preservative-free 1% lidocaine was injected into the anterior chamber.  The anterior chamber was filled with Viscoat viscoelastic.  A 2.4 millimeter keratome was used to make a near-clear corneal incision at the 10:30 position.  .  A curvilinear capsulorrhexis was made with a cystotome and capsulorrhexis forceps.  Balanced salt solution was used to hydrodissect and hydrodelineate the nucleus.   Phacoemulsification was then used in stop and chop fashion to remove the lens nucleus and epinucleus.  The remaining cortex was then removed using the irrigation and aspiration handpiece. Provisc was then placed into the capsular bag to distend it for lens placement.  A lens was then injected into the capsular bag.  The remaining viscoelastic was aspirated.   Wounds were hydrated with  balanced salt solution.  The anterior chamber was inflated to a physiologic pressure with balanced salt solution.  No wound leaks were noted. Cefuroxime 0.1 ml of a 10mg /ml solution was injected into the anterior chamber for a dose of 1 mg of intracameral antibiotic at the completion of the case.   Timolol and Brimonidine drops were applied to the eye.  The patient was taken to the recovery room in stable condition without complications of anesthesia or surgery.  [name] 06/18/2018, 8:33 AM

## 2018-06-18 NOTE — Anesthesia Postprocedure Evaluation (Signed)
Anesthesia Post Note  Patient: Bobby Tanner  Procedure(s) Performed: CATARACT EXTRACTION PHACO AND INTRAOCULAR LENS PLACEMENT (IOC) LEFT DIABETES IVA TOPICAL (Left Eye)  Patient location during evaluation: PACU Anesthesia Type: MAC Level of consciousness: awake and alert and oriented Pain management: satisfactory to patient Vital Signs Assessment: post-procedure vital signs reviewed and stable Respiratory status: spontaneous breathing, nonlabored ventilation and respiratory function stable Cardiovascular status: blood pressure returned to baseline and stable Postop Assessment: Adequate PO intake and No signs of nausea or vomiting Anesthetic complications: no    Raliegh Ip

## 2018-06-18 NOTE — Anesthesia Procedure Notes (Signed)
Procedure Name: MAC Performed by: Prisila Dlouhy, CRNA Pre-anesthesia Checklist: Patient identified, Emergency Drugs available, Suction available, Timeout performed and Patient being monitored Patient Re-evaluated:Patient Re-evaluated prior to induction Oxygen Delivery Method: Nasal cannula Placement Confirmation: positive ETCO2       

## 2018-06-18 NOTE — Anesthesia Preprocedure Evaluation (Signed)
Anesthesia Evaluation  Patient identified by MRN, date of birth, ID band Patient awake    Reviewed: Allergy & Precautions, H&P , NPO status , Patient's Chart, lab work & pertinent test results  Airway Mallampati: II  TM Distance: >3 FB Neck ROM: full    Dental no notable dental hx.    Pulmonary sleep apnea ,    Pulmonary exam normal breath sounds clear to auscultation       Cardiovascular hypertension, + CAD, + Past MI and + Cardiac Stents  Normal cardiovascular exam Rhythm:regular Rate:Normal     Neuro/Psych    GI/Hepatic GERD  ,  Endo/Other  diabetesMorbid obesity  Renal/GU      Musculoskeletal   Abdominal   Peds  Hematology   Anesthesia Other Findings   Reproductive/Obstetrics                             Anesthesia Physical Anesthesia Plan  ASA: III  Anesthesia Plan: MAC   Post-op Pain Management:    Induction:   PONV Risk Score and Plan: 1 and Treatment may vary due to age or medical condition and Midazolam  Airway Management Planned:   Additional Equipment:   Intra-op Plan:   Post-operative Plan:   Informed Consent: I have reviewed the patients History and Physical, chart, labs and discussed the procedure including the risks, benefits and alternatives for the proposed anesthesia with the patient or authorized representative who has indicated his/her understanding and acceptance.     Plan Discussed with: CRNA  Anesthesia Plan Comments:         Anesthesia Quick Evaluation

## 2018-06-18 NOTE — H&P (Signed)
The History and Physical notes are on paper, have been signed, and are to be scanned. The patient remains stable and unchanged from the H&P.   Previous H&P reviewed, patient examined, and there are no changes.  Gladys Deckard 06/18/2018 7:30 AM

## 2018-06-19 ENCOUNTER — Encounter: Payer: Self-pay | Admitting: Ophthalmology

## 2018-06-24 ENCOUNTER — Encounter: Payer: Self-pay | Admitting: Cardiovascular Disease

## 2018-07-05 DIAGNOSIS — E782 Mixed hyperlipidemia: Secondary | ICD-10-CM | POA: Diagnosis not present

## 2018-07-05 DIAGNOSIS — E1169 Type 2 diabetes mellitus with other specified complication: Secondary | ICD-10-CM | POA: Diagnosis not present

## 2018-07-18 ENCOUNTER — Telehealth: Payer: Self-pay | Admitting: Cardiovascular Disease

## 2018-07-18 DIAGNOSIS — I2510 Atherosclerotic heart disease of native coronary artery without angina pectoris: Secondary | ICD-10-CM | POA: Diagnosis not present

## 2018-07-18 DIAGNOSIS — K648 Other hemorrhoids: Secondary | ICD-10-CM | POA: Diagnosis not present

## 2018-07-18 DIAGNOSIS — K921 Melena: Secondary | ICD-10-CM | POA: Diagnosis not present

## 2018-07-18 DIAGNOSIS — I10 Essential (primary) hypertension: Secondary | ICD-10-CM | POA: Diagnosis not present

## 2018-07-18 NOTE — Telephone Encounter (Signed)
Routing to Dr Gollan for review.  

## 2018-07-18 NOTE — Telephone Encounter (Signed)
° °  [facility] Pre-operative Risk Assessment    Request for surgical clearance:  1. What type of surgery is being performed? Colonoscopy   2. When is this surgery scheduled?  08/07/18   3. What type of clearance is required (medical clearance vs. Pharmacy clearance to hold med vs. Both)? Pharmacy   4. Are there any medications that need to be held prior to surgery and how long? Plavix   5. Practice name and name of physician performing surgery? [facility] Dr Madolyn Frieze   6. What is your office phone number [phone]    7.   What is your office fax number [phone]  8.   Anesthesia type (None, local, MAC, general) ? Monitored

## 2018-07-25 NOTE — Telephone Encounter (Signed)
Acceptable risk Hold plavix 5 days before procedure Can restart after

## 2018-07-26 NOTE — Telephone Encounter (Signed)
Routed to number provided via EPIC fax.  

## 2018-07-31 DIAGNOSIS — Z23 Encounter for immunization: Secondary | ICD-10-CM | POA: Diagnosis not present

## 2018-08-07 ENCOUNTER — Ambulatory Visit: Admit: 2018-08-07 | Payer: BLUE CROSS/BLUE SHIELD | Admitting: Internal Medicine

## 2018-08-07 SURGERY — COLONOSCOPY WITH PROPOFOL
Anesthesia: General

## 2018-09-10 ENCOUNTER — Encounter: Payer: Self-pay | Admitting: *Deleted

## 2018-09-11 ENCOUNTER — Ambulatory Visit
Admission: RE | Admit: 2018-09-11 | Discharge: 2018-09-11 | Disposition: A | Discharge status: Home / Self Care | Payer: BLUE CROSS/BLUE SHIELD | Source: Ambulatory Visit | Attending: Internal Medicine | Admitting: Internal Medicine

## 2018-09-11 ENCOUNTER — Ambulatory Visit: Payer: BLUE CROSS/BLUE SHIELD | Admitting: Anesthesiology

## 2018-09-11 ENCOUNTER — Encounter
Admission: RE | Disposition: A | Discharge status: Home / Self Care | Payer: Self-pay | Source: Ambulatory Visit | Attending: Internal Medicine

## 2018-09-11 ENCOUNTER — Encounter: Payer: Self-pay | Admitting: *Deleted

## 2018-09-11 DIAGNOSIS — Z8674 Personal history of sudden cardiac arrest: Secondary | ICD-10-CM | POA: Diagnosis not present

## 2018-09-11 DIAGNOSIS — Z79899 Other long term (current) drug therapy: Secondary | ICD-10-CM | POA: Insufficient documentation

## 2018-09-11 DIAGNOSIS — M1990 Unspecified osteoarthritis, unspecified site: Secondary | ICD-10-CM | POA: Insufficient documentation

## 2018-09-11 DIAGNOSIS — K648 Other hemorrhoids: Secondary | ICD-10-CM | POA: Diagnosis not present

## 2018-09-11 DIAGNOSIS — I252 Old myocardial infarction: Secondary | ICD-10-CM | POA: Diagnosis not present

## 2018-09-11 DIAGNOSIS — Z7982 Long term (current) use of aspirin: Secondary | ICD-10-CM | POA: Diagnosis not present

## 2018-09-11 DIAGNOSIS — D125 Benign neoplasm of sigmoid colon: Secondary | ICD-10-CM | POA: Diagnosis not present

## 2018-09-11 DIAGNOSIS — Z85828 Personal history of other malignant neoplasm of skin: Secondary | ICD-10-CM | POA: Diagnosis not present

## 2018-09-11 DIAGNOSIS — Z961 Presence of intraocular lens: Secondary | ICD-10-CM | POA: Insufficient documentation

## 2018-09-11 DIAGNOSIS — K644 Residual hemorrhoidal skin tags: Secondary | ICD-10-CM | POA: Insufficient documentation

## 2018-09-11 DIAGNOSIS — Z7984 Long term (current) use of oral hypoglycemic drugs: Secondary | ICD-10-CM | POA: Insufficient documentation

## 2018-09-11 DIAGNOSIS — K219 Gastro-esophageal reflux disease without esophagitis: Secondary | ICD-10-CM | POA: Insufficient documentation

## 2018-09-11 DIAGNOSIS — D123 Benign neoplasm of transverse colon: Secondary | ICD-10-CM | POA: Insufficient documentation

## 2018-09-11 DIAGNOSIS — I2510 Atherosclerotic heart disease of native coronary artery without angina pectoris: Secondary | ICD-10-CM | POA: Insufficient documentation

## 2018-09-11 DIAGNOSIS — K641 Second degree hemorrhoids: Secondary | ICD-10-CM | POA: Insufficient documentation

## 2018-09-11 DIAGNOSIS — J449 Chronic obstructive pulmonary disease, unspecified: Secondary | ICD-10-CM | POA: Insufficient documentation

## 2018-09-11 DIAGNOSIS — I10 Essential (primary) hypertension: Secondary | ICD-10-CM | POA: Diagnosis not present

## 2018-09-11 DIAGNOSIS — I255 Ischemic cardiomyopathy: Secondary | ICD-10-CM | POA: Insufficient documentation

## 2018-09-11 DIAGNOSIS — Z955 Presence of coronary angioplasty implant and graft: Secondary | ICD-10-CM | POA: Insufficient documentation

## 2018-09-11 DIAGNOSIS — G4733 Obstructive sleep apnea (adult) (pediatric): Secondary | ICD-10-CM | POA: Insufficient documentation

## 2018-09-11 DIAGNOSIS — Z7902 Long term (current) use of antithrombotics/antiplatelets: Secondary | ICD-10-CM | POA: Diagnosis not present

## 2018-09-11 DIAGNOSIS — E785 Hyperlipidemia, unspecified: Secondary | ICD-10-CM | POA: Insufficient documentation

## 2018-09-11 DIAGNOSIS — K921 Melena: Secondary | ICD-10-CM | POA: Insufficient documentation

## 2018-09-11 DIAGNOSIS — K640 First degree hemorrhoids: Secondary | ICD-10-CM | POA: Diagnosis not present

## 2018-09-11 DIAGNOSIS — Z6841 Body Mass Index (BMI) 40.0 and over, adult: Secondary | ICD-10-CM | POA: Insufficient documentation

## 2018-09-11 DIAGNOSIS — Z951 Presence of aortocoronary bypass graft: Secondary | ICD-10-CM | POA: Insufficient documentation

## 2018-09-11 DIAGNOSIS — D126 Benign neoplasm of colon, unspecified: Secondary | ICD-10-CM | POA: Diagnosis not present

## 2018-09-11 DIAGNOSIS — E119 Type 2 diabetes mellitus without complications: Secondary | ICD-10-CM | POA: Insufficient documentation

## 2018-09-11 DIAGNOSIS — K635 Polyp of colon: Secondary | ICD-10-CM | POA: Diagnosis not present

## 2018-09-11 DIAGNOSIS — K625 Hemorrhage of anus and rectum: Secondary | ICD-10-CM | POA: Diagnosis not present

## 2018-09-11 DIAGNOSIS — Z9841 Cataract extraction status, right eye: Secondary | ICD-10-CM | POA: Insufficient documentation

## 2018-09-11 HISTORY — PX: COLONOSCOPY WITH PROPOFOL: SHX5780

## 2018-09-11 LAB — GLUCOSE, CAPILLARY: GLUCOSE-CAPILLARY: 116 mg/dL — AB (ref 70–99)

## 2018-09-11 SURGERY — COLONOSCOPY WITH PROPOFOL
Anesthesia: General

## 2018-09-11 MED ORDER — PROPOFOL 500 MG/50ML IV EMUL
INTRAVENOUS | Status: DC | PRN
  Administered 2018-09-11: 200 ug/kg/min via INTRAVENOUS

## 2018-09-11 MED ORDER — PROPOFOL 10 MG/ML IV BOLUS
INTRAVENOUS | Status: DC | PRN
  Administered 2018-09-11: 100 mg via INTRAVENOUS

## 2018-09-11 MED ORDER — SODIUM CHLORIDE 0.9 % IV SOLN
INTRAVENOUS | Status: DC
  Administered 2018-09-11: 1000 mL via INTRAVENOUS

## 2018-09-11 MED ORDER — PROPOFOL 10 MG/ML IV BOLUS
INTRAVENOUS | Status: AC
  Filled 2018-09-11: qty 40

## 2018-09-11 MED ORDER — LIDOCAINE HCL (CARDIAC) PF 100 MG/5ML IV SOSY
PREFILLED_SYRINGE | INTRAVENOUS | Status: DC | PRN
  Administered 2018-09-11: 80 mg via INTRAVENOUS

## 2018-09-11 NOTE — H&P (Signed)
Outpatient short stay form Pre-procedure 09/11/2018 8:45 AM Bobby Tanner Bobby Tanner, M.D.  Primary Physician: Emily Filbert, M.D.  Reason for visit:  Hematochezia  History of present illness:  Patient with hx of asthma, COPD, obesity presents with hematochezia. Has 2v CABG. GERD without obvious esophagitis. Has hx of "hemorrhoids". Takes Plavix but has held that for the past 5 days.     Current Facility-Administered Medications:  .  0.9 %  sodium chloride infusion, , Intravenous, Continuous, [place], Benay Pike, MD, Last Rate: 20 mL/hr at 09/11/18 0838, 1,000 mL at 09/11/18 6283  Medications Prior to Admission  Medication Sig Dispense Refill Last Dose  . albuterol (PROAIR HFA) 108 (90 Base) MCG/ACT inhaler Inhale into the lungs every 6 (six) hours as needed for wheezing or shortness of breath.   06/17/2018 at Unknown time  . aspirin EC 81 MG tablet Take 81 mg by mouth daily.   06/17/2018 at Unknown time  . atorvastatin (LIPITOR) 80 MG tablet Take 1 tablet (80 mg total) by mouth daily at 6 PM. 30 tablet 11 09/05/2018  . clopidogrel (PLAVIX) 75 MG tablet Take 1 tablet (75 mg total) by mouth daily. 90 tablet 3 06/17/2018 at Unknown time  . fluticasone (FLONASE) 50 MCG/ACT nasal spray Place 2 sprays into both nostrils daily. Reported on 05/01/2016   06/17/2018 at Unknown time  . furosemide (LASIX) 20 MG tablet Take 1 tablet (20 mg total) by mouth daily as needed. (Patient not taking: Reported on 09/10/2018) 90 tablet 3 Not Taking at Unknown time  . furosemide (LASIX) 20 MG tablet TAKE 1 TABLET BY MOUTH DAILY AS NEEDED 30 tablet 3   . lansoprazole (PREVACID) 30 MG capsule Take 30 mg by mouth daily.   06/17/2018 at Unknown time  . losartan (COZAAR) 50 MG tablet Take 1 tablet (50 mg total) by mouth daily. (Patient not taking: Reported on 06/12/2018) 90 tablet 3 Not Taking  . metFORMIN (GLUCOPHAGE) 500 MG tablet Take 500 mg by mouth daily with breakfast.     . nitroGLYCERIN (NITROSTAT) 0.4 MG SL tablet Place 1  tablet (0.4 mg total) under the tongue every 5 (five) minutes x 3 doses as needed for chest pain. 25 tablet 2   . potassium chloride (K-DUR) 10 MEQ tablet Take 1 tablet (10 mEq total) by mouth daily as needed. 90 tablet 3 Past Month at Unknown time  . sildenafil (REVATIO) 20 MG tablet Take 1 tablet (20 mg total) by mouth 3 (three) times daily as needed. 90 tablet 3 Taking  . zolpidem (AMBIEN) 10 MG tablet Take 5 mg by mouth at bedtime as needed for sleep.   06/17/2018 at Unknown time     Allergies  Allergen Reactions  . Other Itching and Shortness Of Breath  . Pollen Extract Itching and Shortness Of Breath     Past Medical History:  Diagnosis Date  . Arthritis    a. Hands  . Asthma    a. had as a child. pollen could aggrevate but does not use inhalers  . CAD (coronary artery disease)    a. 2014 c/p w/ reportedly nl cardiac w/u;  b. 03/2016 Ant STEMI c/b VT/VF requiring multiple defibs-->Cath/PCI: LM nl, LAD 99p w/ heavy thrombus (3.5 x 15 Xience Alpine DES), D1 50, LCX min irregs throughout, RCA min irregs throughout, EF 45-50%  . Cardiac arrest with ventricular fibrillation ([place])    a. 04/10/2016 VT/VF arrest x 2 in setting of anterior ST elevation MI requiring multiple defibs, chest compressions, amio, epi.  Marland Kitchen  Cataract    a. 03/2016 s/p R cataract extraction and IOL placement.  . Diet-controlled diabetes mellitus ([place])   . Essential hypertension   . GERD (gastroesophageal reflux disease)   . Hemorrhoids   . HOH (hard of hearing)   . Hyperlipidemia   . Ischemic cardiomyopathy    a. 03/2016 LV Gram: EF 45-50%;  b. 03/2014 Echo: EF 50-55%, apical AK, anteroapical HK, mild MR.  . Morbid obesity ([place])   . Obstructive sleep apnea    a. Compliant w/ CPAP.  Marland Kitchen Orthopnea    a. Improved with CPAP.  Marland Kitchen Skin cancer    a. Basal cell carcinoma of neck s/p resection.  . ST elevation myocardial infarction (STEMI) of anterior wall ([place]) 04/10/2016  . Wears hearing aid in both ears     Review of  systems:  Otherwise negative.    Physical Exam  Gen: Alert, oriented. Appears stated age.  HEENT: [place]/AT. PERRLA. Lungs: CTA, no wheezes. CV: RR nl S1, S2. Abd: soft, benign, no masses. BS+ Ext: No edema. Pulses 2+    Planned procedures: Proceed with colonoscopy. The patient understands the nature of the planned procedure, indications, risks, alternatives and potential complications including but not limited to bleeding, infection, perforation, damage to internal organs and possible oversedation/side effects from anesthesia. The patient agrees and gives consent to proceed.  Please refer to procedure notes for findings, recommendations and patient disposition/instructions.     Bobby Tanner Bobby Tanner, M.D. Gastroenterology 09/11/2018  8:45 AM

## 2018-09-11 NOTE — Anesthesia Preprocedure Evaluation (Signed)
Anesthesia Evaluation  Patient identified by MRN, date of birth, ID band Patient awake    Reviewed: Allergy & Precautions, H&P , NPO status , Patient's Chart, lab work & pertinent test results, reviewed documented beta blocker date and time   History of Anesthesia Complications Negative for: history of anesthetic complications  Airway Mallampati: II  TM Distance: >3 FB Neck ROM: full    Dental  (+) Dental Advidsory Given, Caps, Missing, Teeth Intact   Pulmonary shortness of breath, with exertion and lying, asthma , sleep apnea , neg COPD, neg recent URI,           Cardiovascular Exercise Tolerance: Good hypertension, (-) angina+ CAD, + Past MI, + Cardiac Stents and + Orthopnea  (-) CABG (-) dysrhythmias (-) Valvular Problems/Murmurs     Neuro/Psych PSYCHIATRIC DISORDERS Anxiety negative neurological ROS     GI/Hepatic Neg liver ROS, GERD  ,  Endo/Other  diabetes, Well ControlledMorbid obesity  Renal/GU negative Renal ROS  negative genitourinary   Musculoskeletal   Abdominal   Peds  Hematology negative hematology ROS (+)   Anesthesia Other Findings Past Medical History: No date: Arthritis     Comment:  a. Hands No date: Asthma     Comment:  a. had as a child. pollen could aggrevate but does not               use inhalers No date: CAD (coronary artery disease)     Comment:  a. 2014 c/p w/ reportedly nl cardiac w/u;  b. 03/2016 Ant              STEMI c/b VT/VF requiring multiple defibs-->Cath/PCI: LM               nl, LAD 99p w/ heavy thrombus (3.5 x 15 Xience Alpine               DES), D1 50, LCX min irregs throughout, RCA min irregs               throughout, EF 45-50% No date: Cardiac arrest with ventricular fibrillation ([place])     Comment:  a. 04/10/2016 VT/VF arrest x 2 in setting of anterior ST               elevation MI requiring multiple defibs, chest               compressions, amio, epi. No date:  Cataract     Comment:  a. 03/2016 s/p R cataract extraction and IOL placement. No date: Diet-controlled diabetes mellitus ([place]) No date: Essential hypertension No date: GERD (gastroesophageal reflux disease) No date: Hemorrhoids No date: HOH (hard of hearing) No date: Hyperlipidemia No date: Ischemic cardiomyopathy     Comment:  a. 03/2016 LV Gram: EF 45-50%;  b. 03/2014 Echo: EF               50-55%, apical AK, anteroapical HK, mild MR. No date: Morbid obesity ([place]) No date: Obstructive sleep apnea     Comment:  a. Compliant w/ CPAP. No date: Orthopnea     Comment:  a. Improved with CPAP. No date: Skin cancer     Comment:  a. Basal cell carcinoma of neck s/p resection. 04/10/2016: ST elevation myocardial infarction (STEMI) of anterior  wall ([facility]) No date: Wears hearing aid in both ears   Reproductive/Obstetrics negative OB ROS  Anesthesia Physical Anesthesia Plan  ASA: III  Anesthesia Plan: General   Post-op Pain Management:    Induction: Intravenous  PONV Risk Score and Plan: 2 and Propofol infusion and TIVA  Airway Management Planned: Nasal Cannula and Natural Airway  Additional Equipment:   Intra-op Plan:   Post-operative Plan:   Informed Consent: I have reviewed the patients History and Physical, chart, labs and discussed the procedure including the risks, benefits and alternatives for the proposed anesthesia with the patient or authorized representative who has indicated his/her understanding and acceptance.   Dental Advisory Given  Plan Discussed with: Anesthesiologist, CRNA and Surgeon  Anesthesia Plan Comments:         Anesthesia Quick Evaluation

## 2018-09-11 NOTE — Transfer of Care (Signed)
Immediate Anesthesia Transfer of Care Note  Patient: Bobby Tanner  Procedure(s) Performed: COLONOSCOPY WITH PROPOFOL (N/A )  Patient Location: PACU  Anesthesia Type:General  Level of Consciousness: awake, alert  and oriented  Airway & Oxygen Therapy: Patient Spontanous Breathing and Patient connected to nasal cannula oxygen  Post-op Assessment: Report given to RN  Post vital signs: Reviewed and stable  Last Vitals:  Vitals Value Taken Time  BP    Temp    Pulse    Resp    SpO2      Last Pain:  Vitals:   09/11/18 0815  TempSrc: Oral  PainSc: 0-No pain         Complications: No apparent anesthesia complications

## 2018-09-11 NOTE — Interval H&P Note (Signed)
History and Physical Interval Note:  09/11/2018 8:48 AM  Bobby Tanner  has presented today for surgery, with the diagnosis of RECTAL BLEEDING  The various methods of treatment have been discussed with the patient and family. After consideration of risks, benefits and other options for treatment, the patient has consented to  Procedure(s): COLONOSCOPY WITH PROPOFOL (N/A) as a surgical intervention .  The patient's history has been reviewed, patient examined, no change in status, stable for surgery.  I have reviewed the patient's chart and labs.  Questions were answered to the patient's satisfaction.     [place], [place]

## 2018-09-11 NOTE — Anesthesia Postprocedure Evaluation (Signed)
Anesthesia Post Note  Patient: Bobby Tanner  Procedure(s) Performed: COLONOSCOPY WITH PROPOFOL (N/A )  Patient location during evaluation: Endoscopy Anesthesia Type: General Level of consciousness: awake and alert Pain management: pain level controlled Vital Signs Assessment: post-procedure vital signs reviewed and stable Respiratory status: spontaneous breathing, nonlabored ventilation, respiratory function stable and patient connected to nasal cannula oxygen Cardiovascular status: blood pressure returned to baseline and stable Postop Assessment: no apparent nausea or vomiting Anesthetic complications: no     Last Vitals:  Vitals:   09/11/18 0929 09/11/18 0939  BP: 118/68 (!) 123/97  Pulse: 62 60  Resp: 15 11  Temp:    SpO2: 99% 98%    Last Pain:  Vitals:   09/11/18 0939  TempSrc:   PainSc: 0-No pain                 Martha Clan

## 2018-09-11 NOTE — Anesthesia Post-op Follow-up Note (Signed)
Anesthesia QCDR form completed.        

## 2018-09-11 NOTE — Op Note (Signed)
[facility] Gastroenterology Patient Name: Bobby Tanner Procedure Date: 09/11/2018 8:49 AM MRN: [id number] Account #: [id number] Date of Birth: 04-18-1951 Admit Type: Outpatient Age: 67 Room: [facility] ENDO ROOM 3 Gender: Male Note Status: Finalized Procedure:            Colonoscopy Indications:          Hematochezia Providers:            Benay Pike. [name] MD, MD Referring MD:         Rusty Aus, MD (Referring MD) Medicines:            Propofol per Anesthesia Complications:        No immediate complications. Procedure:            Pre-Anesthesia Assessment:                       - The risks and benefits of the procedure and the                        sedation options and risks were discussed with the                        patient. All questions were answered and informed                        consent was obtained.                       - Patient identification and proposed procedure were                        verified prior to the procedure by the nurse. The                        procedure was verified in the procedure room.                       - ASA Grade Assessment: III - A patient with severe                        systemic disease.                       - After reviewing the risks and benefits, the patient                        was deemed in satisfactory condition to undergo the                        procedure.                       After obtaining informed consent, the colonoscope was                        passed under direct vision. Throughout the procedure,                        the patient's blood pressure, pulse, and oxygen  saturations were monitored continuously. The                        Colonoscope was introduced through the anus and                        advanced to the the cecum, identified by appendiceal                        orifice and ileocecal valve. The colonoscopy was                        performed without  difficulty. The patient tolerated the                        procedure well. The quality of the bowel preparation                        was excellent. The ileocecal valve, appendiceal                        orifice, and rectum were photographed. Findings:      The perianal exam findings include non-thrombosed external hemorrhoids       and internal hemorrhoids that prolapse with straining, but spontaneously       regress to the resting position (Grade II).      The digital rectal exam was normal. Pertinent negatives include normal       sphincter tone.      Three sessile polyps were found in the proximal sigmoid colon and       transverse colon. The polyps were 3 to 5 mm in size. These polyps were       removed with a jumbo cold forceps. Resection and retrieval were complete.      Non-bleeding internal hemorrhoids were found during retroflexion. The       hemorrhoids were Grade I (internal hemorrhoids that do not prolapse).      The exam was otherwise without abnormality. Impression:           - Non-thrombosed external hemorrhoids and internal                        hemorrhoids that prolapse with straining, but                        spontaneously regress to the resting position (Grade                        II) found on perianal exam.                       - Three 3 to 5 mm polyps in the proximal sigmoid colon                        and in the transverse colon, removed with a jumbo cold                        forceps. Resected and retrieved.                       - Non-bleeding internal hemorrhoids.                       -  The examination was otherwise normal. Recommendation:       - Patient has a contact number available for                        emergencies. The signs and symptoms of potential                        delayed complications were discussed with the patient.                        Return to normal activities tomorrow. Written discharge                        instructions  were provided to the patient.                       - Resume previous diet.                       - Continue present medications.                       - Repeat colonoscopy for surveillance based on                        pathology results.                       - Return to GI office PRN.                       - The findings and recommendations were discussed with                        the patient and their spouse. Procedure Code(s):    --- Professional ---                       [phone], Colonoscopy, flexible; with biopsy, single or                        multiple Diagnosis Code(s):    --- Professional ---                       K92.1, Melena (includes Hematochezia)                       K64.4, Residual hemorrhoidal skin tags                       K64.1, Second degree hemorrhoids                       D12.3, Benign neoplasm of transverse colon (hepatic                        flexure or splenic flexure)                       D12.5, Benign neoplasm of sigmoid colon CPT copyright 2018 American Medical Association. All rights reserved. The codes documented in this report are preliminary and upon coder review may  be revised to meet current compliance requirements. Efrain Sella MD, MD 09/11/2018  9:19:15 AM This report has been signed electronically. Number of Addenda: 0 Note Initiated On: 09/11/2018 8:49 AM Scope Withdrawal Time: 0 hours 6 minutes 36 seconds  Total Procedure Duration: 0 hours 11 minutes 48 seconds       [facility]

## 2018-09-12 ENCOUNTER — Encounter: Payer: Self-pay | Admitting: Internal Medicine

## 2018-09-12 LAB — SURGICAL PATHOLOGY

## 2018-09-21 DIAGNOSIS — M898X6 Other specified disorders of bone, lower leg: Secondary | ICD-10-CM | POA: Diagnosis not present

## 2018-09-23 DIAGNOSIS — M79661 Pain in right lower leg: Secondary | ICD-10-CM | POA: Diagnosis not present

## 2018-09-30 DIAGNOSIS — E782 Mixed hyperlipidemia: Secondary | ICD-10-CM | POA: Diagnosis not present

## 2018-09-30 DIAGNOSIS — E1169 Type 2 diabetes mellitus with other specified complication: Secondary | ICD-10-CM | POA: Diagnosis not present

## 2018-09-30 DIAGNOSIS — R972 Elevated prostate specific antigen [PSA]: Secondary | ICD-10-CM | POA: Diagnosis not present

## 2018-10-07 DIAGNOSIS — G4733 Obstructive sleep apnea (adult) (pediatric): Secondary | ICD-10-CM | POA: Diagnosis not present

## 2018-10-07 DIAGNOSIS — E1151 Type 2 diabetes mellitus with diabetic peripheral angiopathy without gangrene: Secondary | ICD-10-CM | POA: Diagnosis not present

## 2018-10-07 DIAGNOSIS — Z0000 Encounter for general adult medical examination without abnormal findings: Secondary | ICD-10-CM | POA: Diagnosis not present

## 2018-10-07 DIAGNOSIS — E782 Mixed hyperlipidemia: Secondary | ICD-10-CM | POA: Diagnosis not present

## 2018-10-28 ENCOUNTER — Encounter: Payer: Self-pay | Admitting: Internal Medicine

## 2018-10-28 ENCOUNTER — Ambulatory Visit (INDEPENDENT_AMBULATORY_CARE_PROVIDER_SITE_OTHER): Payer: BLUE CROSS/BLUE SHIELD | Admitting: Internal Medicine

## 2018-10-28 VITALS — BP 142/82 | HR 68 | Ht 71.0 in | Wt 295.0 lb

## 2018-10-28 DIAGNOSIS — J4520 Mild intermittent asthma, uncomplicated: Secondary | ICD-10-CM

## 2018-10-28 DIAGNOSIS — G4733 Obstructive sleep apnea (adult) (pediatric): Secondary | ICD-10-CM

## 2018-10-28 MED ORDER — BUDESONIDE-FORMOTEROL FUMARATE 80-4.5 MCG/ACT IN AERO: 2.0000 | INHALATION_SPRAY | Freq: Two times a day (BID) | RESPIRATORY_TRACT | 0 refills | Status: DC

## 2018-10-28 MED ORDER — BUDESONIDE-FORMOTEROL FUMARATE 80-4.5 MCG/ACT IN AERO: 2.0000 | INHALATION_SPRAY | Freq: Two times a day (BID) | RESPIRATORY_TRACT | 12 refills | Status: DC

## 2018-10-28 NOTE — Progress Notes (Signed)
Name: NOBLE CICALESE MRN: [id number] DOB: 14-Jul-1951   CONSULTATION DATE:10/28/2018   REFERRING MD :Dr. 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  No signs of infection   H/o CAD sees Dr Rockey Situ On ASA/plavix   Has dx of OSA 100% compliance days and >4hrs AHI down to 0.9    No signs of CHF at this time     Review of Systems:  Gen:  Denies  fever, sweats, chills weigh loss  HEENT: Denies blurred vision, double vision, ear pain, eye pain, hearing loss, nose bleeds, sore throat Cardiac:  No dizziness, chest pain or heaviness, chest tightness,edema, No JVD Resp:   No cough, -sputum production, +shortness of breath,+wheezing, -hemoptysis,  Gi: Denies swallowing difficulty, stomach pain, nausea or vomiting, diarrhea, constipation, bowel incontinence Gu:  Denies bladder incontinence, burning urine Ext:   Denies Joint pain, stiffness or swelling Skin: Denies  skin rash, easy bruising or bleeding or hives Endoc:  Denies polyuria, polydipsia , polyphagia or weight change Psych:   Denies depression, insomnia or hallucinations  Other:  All other systems negative  Ht 5\' 11"  (1.803 m)   Wt 295 lb (133.8 kg)   BMI 41.14 kg/m  BP (!) 142/82 (BP Location: Left Arm, Cuff Size: Normal)   Pulse 68   Ht 5\' 11"  (1.803 m)   Wt 295 lb (133.8 kg)   SpO2 97%   BMI 41.14 kg/m   Physical Examination:   GENERAL:NAD, no fevers, chills, no weakness no fatigue HEAD: Normocephalic, atraumatic.  EYES: Pupils equal, round, reactive to light. Extraocular muscles intact. No scleral icterus.  MOUTH: Moist mucosal membrane. Dentition intact. No abscess noted.  EAR, NOSE, THROAT: Clear without  exudates. No external lesions.  NECK: Supple. No thyromegaly. No nodules. No JVD.  PULMONARY: CTA B/L no wheezing, rhonchi, crackles CARDIOVASCULAR: S1 and S2. Regular rate and rhythm. No murmurs, rubs, or gallops. No edema. Pedal pulses 2+ bilaterally.  GASTROINTESTINAL: Soft, nontender, nondistended. No masses. Positive bowel sounds. No hepatosplenomegaly.  MUSCULOSKELETAL: No swelling, clubbing, or edema. Range of motion full in all extremities.  NEUROLOGIC: Cranial nerves II through XII are intact. No gross focal neurological deficits. Sensation intact. Reflexes intact.  SKIN: No ulceration, lesions, rashes, or cyanosis. Skin warm and dry. Turgor intact.  PSYCHIATRIC: Mood, affect within normal limits. The patient is awake, alert and oriented x 3. Insight, judgment intact.  ALL OTHER ROS ARE NEGATIVE      ASSESSMENT / PLAN: 67 yo obese white male with OSA with underlying ASTHMA seems NOT to be well controlled at this time His OSA is under control with great compliance of 100%-on Auto CPAP 9-11cm h20 with AHI 0.9 with underlying Mild persistent ASTHMA  ASTHMA-mild persistent Start Symbicort 80 BID Use albuterol as needed  OSA-patient uses and benefits from CPAP therapy Uses nasal pillows No sign leak Doing well  Cardiology Follow up with Dr Rockey Situ  Obesity -recommend significant weight loss -recommend changing diet  Deconditioned state -Recommend increased daily activity and exercise    Follow up in 3 months for interval assessment of ASTHMA  Corrin Parker, M.D.  [facility]  Medical Director [place] Director  [facility] Cardio-Pulmonary Department

## 2018-10-28 NOTE — Patient Instructions (Signed)
Continue CPAP therapy as prescribed  Start Symbicort 80 mcg 2 puffs twice daily   Use ALbuterol as needed

## 2018-10-28 NOTE — Addendum Note (Signed)
Addended by: Darreld Mclean on: 10/28/2018 10:00 AM   Modules accepted: Orders

## 2018-12-27 ENCOUNTER — Other Ambulatory Visit: Payer: Self-pay

## 2018-12-27 MED ORDER — FLUTICASONE-SALMETEROL 250-50 MCG/DOSE IN AEPB: 1.0000 | INHALATION_SPRAY | Freq: Two times a day (BID) | RESPIRATORY_TRACT | 12 refills | Status: DC

## 2018-12-27 NOTE — Telephone Encounter (Signed)
Received notice from [facility] that patient's copay for Advair would be $10, where he is currently paying $73 for symbicort. Dr. Horton Marshall the change. Symbicort discontinued and order entered for Advair 250/50. Pharmacy aware.

## 2019-01-14 ENCOUNTER — Telehealth: Payer: Self-pay | Admitting: Internal Medicine

## 2019-01-14 ENCOUNTER — Encounter: Payer: Self-pay | Admitting: Internal Medicine

## 2019-01-14 ENCOUNTER — Ambulatory Visit (INDEPENDENT_AMBULATORY_CARE_PROVIDER_SITE_OTHER): Payer: BLUE CROSS/BLUE SHIELD | Admitting: Internal Medicine

## 2019-01-14 VITALS — BP 140/80 | HR 73 | Resp 16 | Ht 71.0 in | Wt 293.0 lb

## 2019-01-14 DIAGNOSIS — G4733 Obstructive sleep apnea (adult) (pediatric): Secondary | ICD-10-CM

## 2019-01-14 DIAGNOSIS — J4520 Mild intermittent asthma, uncomplicated: Secondary | ICD-10-CM

## 2019-01-14 DIAGNOSIS — J309 Allergic rhinitis, unspecified: Secondary | ICD-10-CM | POA: Diagnosis not present

## 2019-01-14 MED ORDER — CETIRIZINE HCL 10 MG PO TABS: 10.0000 mg | ORAL_TABLET | Freq: Every day | ORAL | 6 refills | Status: DC

## 2019-01-14 MED ORDER — PREDNISONE 20 MG PO TABS: 20.0000 mg | ORAL_TABLET | Freq: Every day | ORAL | 0 refills | Status: DC

## 2019-01-14 MED ORDER — MOMETASONE FUROATE 50 MCG/ACT NA SUSP: 2.0000 | Freq: Every day | NASAL | 2 refills | Status: DC

## 2019-01-14 MED ORDER — AZITHROMYCIN 250 MG PO TABS: ORAL_TABLET | ORAL | 0 refills | Status: DC

## 2019-01-14 NOTE — Progress Notes (Signed)
   Name: Bobby Tanner MRN: [id number] DOB: 1951/01/29   CONSULTATION DATE:01/14/2019   REFERRING MD :Dr. 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   CHIEF COMPLAINT:  follow up OSA and follow up [facility]: Asthma not controlled last 1 months Has had URI symptoms Feeling tired No acute distress Has cough and intermittent wheezing +SOB and DOE  feels warm but no fevers at home  H/o CAD sees Dr Rockey Situ On ASA/plavix   Has dx of OSA 100% compliance days and greater than 4 hrs AHI down to 1.5 On AUTOCPAP 9-11 cm h20     Review of Systems:  Gen:  Denies  fever, sweats, chills weigh loss  HEENT: Denies blurred vision, double vision, ear pain, eye pain, hearing loss, nose bleeds, sore throat Cardiac:  No dizziness, chest pain or heaviness, chest tightness,edema, No JVD Resp:   No cough, -sputum production, +shortness of breath,+wheezing, -hemoptysis,  Gi: Denies swallowing difficulty, stomach pain, nausea or vomiting, diarrhea, constipation, bowel incontinence Gu:  Denies bladder incontinence, burning urine Ext:   Denies Joint pain, stiffness or swelling Skin: Denies  skin rash, easy bruising or bleeding or hives Endoc:  Denies polyuria, polydipsia , polyphagia or weight change Psych:   Denies depression, insomnia or hallucinations  Other:  All other systems negative    BP 140/80 (BP Location: Left Arm, Cuff Size: Large)   Pulse 73   Resp 16   Ht 5\' 11"  (1.803 m)   Wt 293 lb (132.9 kg)   SpO2 96%   BMI 40.87 kg/m  BP 140/80 (BP Location: Left Arm, Cuff Size: Large)   Pulse 73   Resp 16   Ht 5\' 11"  (1.803 m)   Wt 293 lb (132.9 kg)   SpO2 96%   BMI 40.87 kg/m   Physical Examination:   GENERAL:NAD, no fevers, chills, no weakness no fatigue HEAD: Normocephalic, atraumatic.  EYES: PERLA, EOMI No scleral icterus.  MOUTH: Moist  mucosal membrane.  EAR, NOSE, THROAT: Clear without exudates. No external lesions.  NECK: Supple. No thyromegaly.  No JVD.  PULMONARY: CTA B/L no wheezing, rhonchi, crackles CARDIOVASCULAR: S1 and S2. Regular rate and rhythm. No murmurs GASTROINTESTINAL: Soft, nontender, nondistended. Positive bowel sounds.  MUSCULOSKELETAL: No swelling, clubbing, or edema.  NEUROLOGIC: No gross focal neurological deficits. 5/5 strength all extremities SKIN: No ulceration, lesions, rashes, or cyanosis.  PSYCHIATRIC: Insight, judgment intact. -depression -anxiety ALL OTHER ROS ARE NEGATIVE           ASSESSMENT / PLAN:  68 yo morbidly obese white male with OSA with underlying ASTHMA with URI symptoms with chest congestion with deconditioned state and OSA  OSA is well treated  Great compliance and AHI down to 1.5 He uses and benefits from CPAP therapy   ASTHMA moderate persistent with MILD exacerbation Due to URI Continue ADVAIR Albuterol as needed Prednisone 20 mg for 5 days z pak    Obesity -recommend significant weight loss -recommend changing diet  Deconditioned state -Recommend increased daily activity and exercise      Follow up in 6 months for interval assessment of ASTHMA  Kaz Auld Patricia Pesa, M.D.  [facility]  Medical Director [place] Director [place] Department

## 2019-01-14 NOTE — Patient Instructions (Addendum)
Stop Flonase Start [employer] Zyrtec 10 mg at night  Prednisone 20 mg for 5 days Z pak  Continue CPAP as prescribed

## 2019-01-14 NOTE — Telephone Encounter (Signed)
Rx resent for #5 Prednisone. Nothing further needed.

## 2019-01-23 DIAGNOSIS — E119 Type 2 diabetes mellitus without complications: Secondary | ICD-10-CM | POA: Diagnosis not present

## 2019-02-14 ENCOUNTER — Telehealth: Payer: Self-pay | Admitting: Cardiovascular Disease

## 2019-02-14 NOTE — Telephone Encounter (Signed)
   Cardiac Questionnaire:    Since your last visit or hospitalization:    1. Have you been having new or worsening chest pain? No   2. Have you been having new or worsening shortness of breath? No 3. Have you been having new or worsening leg swelling, wt gain, or increase in abdominal girth (pants fitting more tightly)? No   4. Have you had any passing out spells? No    *A YES to any of these questions would result in the appointment being kept. *If all the answers to these questions are NO, we should indicate that given the current situation regarding the worldwide coronarvirus pandemic, at the recommendation of the CDC, we are looking to limit gatherings in our waiting area, and thus will reschedule their appointment beyond four weeks from today.        Sending to pool for rescheduling.

## 2019-02-18 ENCOUNTER — Ambulatory Visit: Payer: Medicare Other | Admitting: Cardiovascular Disease

## 2019-02-19 NOTE — Telephone Encounter (Signed)
Scheduled 3/26 with Rockey Situ

## 2019-02-19 NOTE — Telephone Encounter (Signed)
TELEPHONE CALL NOTE  Bobby Tanner has been deemed a candidate for a follow-up tele-health visit to limit community exposure during the Covid-19 pandemic. I spoke with the patient via phone to ensure availability of phone/video source, confirm preferred email & phone number, discuss instructions and expectations, and review consent.   I reminded Bobby Tanner to be prepared with any vital sign and/or heart rhythm information that could potentially be obtained via home monitoring, at the time of his visit.  Finally, I reminded Bobby Tanner to expect an e-mail containing a link for their video-based visit approximately 15 minutes before his visit, or alternatively, a phone call at the time of his visit if his visit is planned to be a phone encounter.  Did the patient verbally consent to treatment as below? YES  Bobby Tanner 02/19/2019 10:58 AM  DOWNLOADING THE SOFTWARE (If applicable)  Download the [employer] app to enable video and telephone visits with your [facility] Provider.   Instructions for downloading [employer] WebEx: - Go to https://www.webex.com/downloads.html and follow the instructions - If you have technical difficulties with downloading WebEx, please call WebEx at [phone]. - Once the app is downloaded (can be done on either mobile or desktop computer), go to Settings in the upper left hand corner.  Be sure that camera and audio are enabled.  - You will receive an email message with a link to the meeting with a time to join for your tele-health visit.  - Please download the app and have settings configured prior to the appointment time.    CONSENT FOR TELE-HEALTH VISIT - PLEASE REVIEW  I hereby voluntarily request, consent and authorize [facility] and its employed or contracted physicians, physician assistants, nurse practitioners or other licensed health care professionals (the Practitioner), to provide me with telemedicine health care services (the  Services") as deemed necessary by the treating Practitioner. I acknowledge and consent to receive the Services by the Practitioner via telemedicine. I understand that the telemedicine visit will involve communicating with the Practitioner through live audiovisual communication technology and the disclosure of certain medical information by electronic transmission. I acknowledge that I have been given the opportunity to request an in-person assessment or other available alternative prior to the telemedicine visit and am voluntarily participating in the telemedicine visit.  I understand that I have the right to withhold or withdraw my consent to the use of telemedicine in the course of my care at any time, without affecting my right to future care or treatment, and that the Practitioner or I may terminate the telemedicine visit at any time. I understand that I have the right to inspect all information obtained and/or recorded in the course of the telemedicine visit and may receive copies of available information for a reasonable fee.  I understand that some of the potential risks of receiving the Services via telemedicine include:   Delay or interruption in medical evaluation due to technological equipment failure or disruption;  Information transmitted may not be sufficient (e.g. poor resolution of images) to allow for appropriate medical decision making by the Practitioner; and/or   In rare instances, security protocols could fail, causing a breach of personal health information.  Furthermore, I acknowledge that it is my responsibility to provide information about my medical history, conditions and care that is complete and accurate to the best of my ability. I acknowledge that Practitioner's advice, recommendations, and/or decision may be based on factors not within their control, such as incomplete  or inaccurate data provided by me or distortions of diagnostic images or specimens that may result from  electronic transmissions. I understand that the practice of medicine is not an exact science and that Practitioner makes no warranties or guarantees regarding treatment outcomes. I acknowledge that I will receive a copy of this consent concurrently upon execution via email to the email address I last provided but may also request a printed copy by calling the office of [place].    I understand that my insurance will be billed for this visit.   I have read or had this consent read to me.  I understand the contents of this consent, which adequately explains the benefits and risks of the Services being provided via telemedicine.   I have been provided ample opportunity to ask questions regarding this consent and the Services and have had my questions answered to my satisfaction.  I give my informed consent for the services to be provided through the use of telemedicine in my medical care  By participating in this telemedicine visit I agree to the above.

## 2019-02-20 ENCOUNTER — Telehealth (INDEPENDENT_AMBULATORY_CARE_PROVIDER_SITE_OTHER): Payer: BLUE CROSS/BLUE SHIELD | Admitting: Cardiovascular Disease

## 2019-02-20 ENCOUNTER — Other Ambulatory Visit: Payer: Self-pay

## 2019-02-20 DIAGNOSIS — I10 Essential (primary) hypertension: Secondary | ICD-10-CM

## 2019-02-20 DIAGNOSIS — E782 Mixed hyperlipidemia: Secondary | ICD-10-CM | POA: Diagnosis not present

## 2019-02-20 DIAGNOSIS — I25118 Atherosclerotic heart disease of native coronary artery with other forms of angina pectoris: Secondary | ICD-10-CM | POA: Insufficient documentation

## 2019-02-20 DIAGNOSIS — R0602 Shortness of breath: Secondary | ICD-10-CM | POA: Diagnosis not present

## 2019-02-20 DIAGNOSIS — R5382 Chronic fatigue, unspecified: Secondary | ICD-10-CM

## 2019-02-20 MED ORDER — LOSARTAN POTASSIUM 25 MG PO TABS: 25.0000 mg | ORAL_TABLET | Freq: Every day | ORAL | 3 refills | Status: DC

## 2019-02-20 NOTE — Patient Instructions (Addendum)
Medication Instructions:  Please decrease the losartan down to 25 mg daily    If you need a refill on your cardiac medications before your next appointment, please call your pharmacy.    Lab work: No new labs needed   If you have labs (blood work) drawn today and your tests are completely normal, you will receive your results only by: Marland Kitchen MyChart Message (if you have MyChart) OR . A paper copy in the mail If you have any lab test that is abnormal or we need to change your treatment, we will call you to review the results.   Testing/Procedures: No new testing needed   Follow-Up: At [facility], you and your health needs are our priority.  As part of our continuing mission to provide you with exceptional heart care, we have created designated Provider Care Teams.  These Care Teams include your primary Cardiologist (physician) and Advanced Practice Providers (APPs -  Physician Assistants and Nurse Practitioners) who all work together to provide you with the care you need, when you need it.  . You will need a follow up appointment in 12 months .   Please call our office 2 months in advance to schedule this appointment.    . Providers on your designated Care Team:   . Murray Hodgkins, NP . Christell Faith, PA-C . Marrianne Mood, PA-C  Any Other Special Instructions Will Be Listed Below (If Applicable).  For educational health videos Log in to : [email or web address] Or : [email or web address], password : [email or web address]

## 2019-02-20 NOTE — Addendum Note (Signed)
Addended by: Valora Corporal on: 02/20/2019 03:51 PM   Modules accepted: Orders

## 2019-02-20 NOTE — Progress Notes (Addendum)
Virtual Visit via Video Note   This visit type was conducted due to national recommendations for restrictions regarding the COVID-19 Pandemic (e.g. social distancing) in an effort to limit this patient's exposure and mitigate transmission in our community.  Due to her co-morbid illnesses, this patient is at least at moderate risk for complications without adequate follow up.  This format is felt to be most appropriate for this patient at this time.  All issues noted in this document were discussed and addressed.  A limited physical exam was performed with this format.  Please refer to the patient's chart for her consent to telehealth for [facility].    Date:  02/20/2019   ID:  Bobby Tanner, DOB 07-02-1951, MRN [id number]  Patient Location:  [place] [address]   Provider location:   [facility], [place] office  PCP:  Rusty Aus, MD  Cardiologist:  Rockey Situ  Chief Complaint:  SOB, fatigue   History of Present Illness:    Bobby Tanner is a 68 y.o. male who presents via audio/video conferencing for a telehealth visit today.   The patient does not symptoms concerning for COVID-19 infection (fever, chills, cough, or new SHORTNESS OF BREATH).  Please refer to prior office visit for complete details: Patient has a past medical history of  Anxiety/stress at work acute anterior ST elevation MI  s/p PCI to the proximal LAD  VF arrest requiring chest compressions 04/10/2016,  residual 50% diagonal disease discharged on 04/12/2016  with follow-up today for his coronary artery disease, STEMI  96% 123/79 Sugar 123 HR 65  Rare tunnel vision,  Since old MI, chronic issue Evening 5-7 pm Goes away without intervention, etiology unclear  No chest pain, Chronic mild SOB, Also reports having some fatigue Losartan "causes SOB", takes at night,  Uses CPAP  Also wonders if his Lipitor is causing side effects Does not like taking medications in  general   Prior CV studies:   The following studies were reviewed today:   stress test 11/28/2016 showing no ischemia Previously reported when he thinks about things that need to be done, he develops shortness of breath, vision issues, chest tightness   Previous Echo showed EF 50-55%, akinesis of the apical myocardium, hypokinesis of the anteroseptal myocardium, LV diastolic function was normal, mild MR, RV systolic function was normal, PASP was normal.   Cardiac catheterization 04/10/2016: 1st Diag lesion, 50% stenosed. Prox LAD lesion, 99% stenosed. Post intervention, there is a 0% residual stenosis. The lesion was not previously treated.The left ventricular systolic function is normal.  1. Severe one-vessel coronary artery disease with heavy thrombotic lesion in the proximal LAD. Otherwise no obstructive disease. 2. Mildly reduced LV systolic function with mild anterior wall hypokinesis. Moderately to severely elevated left ventricular end-diastolic pressure. 3. Successful aspiration thrombectomy and drug-eluting stent placement to the proximal LAD.   Past Medical History:  Diagnosis Date  . Arthritis    a. Hands  . Asthma    a. had as a child. pollen could aggrevate but does not use inhalers  . CAD (coronary artery disease)    a. 2014 c/p w/ reportedly nl cardiac w/u;  b. 03/2016 Ant STEMI c/b VT/VF requiring multiple defibs-->Cath/PCI: LM nl, LAD 99p w/ heavy thrombus (3.5 x 15 Xience Alpine DES), D1 50, LCX min irregs throughout, RCA min irregs throughout, EF 45-50%  . Cardiac arrest with ventricular fibrillation ([place])    a. 04/10/2016 VT/VF arrest x 2 in  setting of anterior ST elevation MI requiring multiple defibs, chest compressions, amio, epi.  . Cataract    a. 03/2016 s/p R cataract extraction and IOL placement.  . Diet-controlled diabetes mellitus ([place])   . Essential hypertension   . GERD (gastroesophageal reflux disease)   . Hemorrhoids   . HOH (hard of hearing)   .  Hyperlipidemia   . Ischemic cardiomyopathy    a. 03/2016 LV Gram: EF 45-50%;  b. 03/2014 Echo: EF 50-55%, apical AK, anteroapical HK, mild MR.  . Morbid obesity ([place])   . Obstructive sleep apnea    a. Compliant w/ CPAP.  Marland Kitchen Orthopnea    a. Improved with CPAP.  Marland Kitchen Skin cancer    a. Basal cell carcinoma of neck s/p resection.  . ST elevation myocardial infarction (STEMI) of anterior wall ([place]) 04/10/2016  . Wears hearing aid in both ears    Past Surgical History:  Procedure Laterality Date  . CARDIAC CATHETERIZATION N/A 04/10/2016   Procedure: Left Heart Cath and Coronary Angiography;  Surgeon: Wellington Hampshire, MD;  Location: [place] [facility];  Service: Cardiovascular;  Laterality: N/A;  . CARDIAC CATHETERIZATION N/A 04/10/2016   Procedure: Coronary Stent Intervention;  Surgeon: Wellington Hampshire, MD;  Location: [facility];  Service: Cardiovascular;  Laterality: N/A;  . CATARACT EXTRACTION W/PHACO Right 04/03/2016   Procedure: CATARACT EXTRACTION PHACO AND INTRAOCULAR LENS PLACEMENT (IOC);  Surgeon: Estill Cotta, MD;  Location: [facility];  Service: Ophthalmology;  Laterality: Right;  [place] 01:13 AP% 25.8 CDE 34.18 fluid pack lot #[id number]  . CATARACT EXTRACTION W/PHACO Left 06/18/2018   Procedure: CATARACT EXTRACTION PHACO AND INTRAOCULAR LENS PLACEMENT ([place]) LEFT DIABETES IVA TOPICAL;  Surgeon: Leandrew Koyanagi, MD;  Location: [facility];  Service: Ophthalmology;  Laterality: Left;  Diabetic - diet controlled  . COLONOSCOPY  2014  . COLONOSCOPY WITH PROPOFOL N/A 09/11/2018   Procedure: COLONOSCOPY WITH PROPOFOL;  Surgeon: Toledo, Benay Pike, MD;  Location: [facility];  Service: Gastroenterology;  Laterality: N/A;  . CORONARY ANGIOPLASTY    . EYE SURGERY    . skin cancers     head     No outpatient medications have been marked as taking for the 02/20/19 encounter (Appointment) with Bobby Merritts, MD.     Allergies:   Other and Pollen extract   Social  History   Tobacco Use  . Smoking status: Never Smoker  . Smokeless tobacco: Never Used  . Tobacco comment: no smokers in  home  Substance Use Topics  . Alcohol use: Yes    Alcohol/week: 0.0 standard drinks    Comment: occasional drink every one or two weeks  . Drug use: No     Family Hx: The patient's family history includes Alzheimer's disease in his mother; CAD in his father; Hypertension in his brother, father, and paternal grandfather.  ROS:   Please see the history of present illness.    Review of Systems  Constitutional: Negative.   Respiratory: Negative.   Cardiovascular: Negative.   Gastrointestinal: Negative.   Musculoskeletal: Negative.   Neurological: Negative.   Psychiatric/Behavioral: Negative.   All other systems reviewed and are negative.     Labs/Other Tests and Data Reviewed:    Recent Labs: No results found for requested labs within last 8760 hours.   Recent Lipid Panel Lab Results  Component Value Date/Time   CHOL 189 04/11/2016 06:48 AM   TRIG 100 04/11/2016 06:48 AM   HDL 37 (L) 04/11/2016 06:48 AM  CHOLHDL 5.1 04/11/2016 06:48 AM   LDLCALC 132 (H) 04/11/2016 06:48 AM    Wt Readings from Last 3 Encounters:  01/14/19 293 lb (132.9 kg)  10/28/18 295 lb (133.8 kg)  09/11/18 287 lb (130.2 kg)     Exam:    Vital Signs: Numbers detailed as above Systolic pressures in the 120s  Well nourished, well developed male in no acute distress. Visualized on WebCam No respiratory distress Appears comfortable, skin normal, alert oriented, No significant leg edema   ASSESSMENT & PLAN:     Coronary artery disease of native artery of native heart with stable angina pectoris (HCC) Currently with no symptoms of angina. No further workup at this time. Continue current medication regimen.  Shortness of breath Etiology unclear, he feels losartan contributing to shortness of breath symptoms He is not taking Lasix, does not feel that he has leg  swelling Takes Lasix only as needed Feels he is getting older, deconditioning, weight is a problem causing his shortness of breath  Essential hypertension He has he is having some shortness of breath other side effects from losartan Before changing the medication recommended he cut the pill in half Even could stop the medication for 1 week to see if he feels better Current blood pressure in the 120 range We can change to an alternate medication if symptoms improve without losartan  Mixed hyperlipidemia Wonders if he is having side effects from his Lipitor He will try to cut back on the losartan but if no improvement of his symptoms he would like to cut back on his Lipitor down to half  Chronic fatigue Etiology unclear, recommended walking program, weight loss Medication changes as above   COVID-19 Education: The signs and symptoms of COVID-19 were discussed with the patient and how to seek care for testing (follow up with PCP or arrange E-visit).  The importance of social distancing was discussed today.  Patient Risk:   After full review of this patients clinical status, I feel that they are at least moderate risk at this time.  Time:   Today, I have spent 25 minutes with the patient with telehealth technology discussing etiology of his shortness of breath, general malaise, management of his lipids, anginal symptoms Discussed blood pressure medications, changes made.     Medication Adjustments/Labs and Tests Ordered: Current medicines are reviewed at length with the patient today.  Concerns regarding medicines are outlined above.   Tests Ordered: No tests ordered   Medication Changes: No changes made   Disposition: Follow-up in 6 months   Signed, Ida Rogue, MD  02/20/2019 2:07 PM    [facility] [id number], [place], [place] [address]

## 2019-03-05 DIAGNOSIS — Z6841 Body Mass Index (BMI) 40.0 and over, adult: Secondary | ICD-10-CM | POA: Diagnosis not present

## 2019-03-05 DIAGNOSIS — E1151 Type 2 diabetes mellitus with diabetic peripheral angiopathy without gangrene: Secondary | ICD-10-CM | POA: Diagnosis not present

## 2019-03-05 DIAGNOSIS — M5116 Intervertebral disc disorders with radiculopathy, lumbar region: Secondary | ICD-10-CM | POA: Diagnosis not present

## 2019-03-10 DIAGNOSIS — M5116 Intervertebral disc disorders with radiculopathy, lumbar region: Secondary | ICD-10-CM | POA: Diagnosis not present

## 2019-03-10 DIAGNOSIS — M5416 Radiculopathy, lumbar region: Secondary | ICD-10-CM | POA: Diagnosis not present

## 2019-04-11 ENCOUNTER — Other Ambulatory Visit: Payer: Self-pay | Admitting: Internal Medicine

## 2019-04-11 DIAGNOSIS — J309 Allergic rhinitis, unspecified: Secondary | ICD-10-CM

## 2019-04-17 DIAGNOSIS — E782 Mixed hyperlipidemia: Secondary | ICD-10-CM | POA: Diagnosis not present

## 2019-04-17 DIAGNOSIS — G4733 Obstructive sleep apnea (adult) (pediatric): Secondary | ICD-10-CM | POA: Diagnosis not present

## 2019-04-17 DIAGNOSIS — E1151 Type 2 diabetes mellitus with diabetic peripheral angiopathy without gangrene: Secondary | ICD-10-CM | POA: Diagnosis not present

## 2019-04-24 DIAGNOSIS — E782 Mixed hyperlipidemia: Secondary | ICD-10-CM | POA: Diagnosis not present

## 2019-04-24 DIAGNOSIS — G4733 Obstructive sleep apnea (adult) (pediatric): Secondary | ICD-10-CM | POA: Diagnosis not present

## 2019-04-24 DIAGNOSIS — E1151 Type 2 diabetes mellitus with diabetic peripheral angiopathy without gangrene: Secondary | ICD-10-CM | POA: Diagnosis not present

## 2019-04-24 DIAGNOSIS — Z125 Encounter for screening for malignant neoplasm of prostate: Secondary | ICD-10-CM | POA: Diagnosis not present

## 2019-05-15 ENCOUNTER — Other Ambulatory Visit: Payer: Self-pay | Admitting: Cardiovascular Disease

## 2019-05-21 DIAGNOSIS — Z961 Presence of intraocular lens: Secondary | ICD-10-CM | POA: Diagnosis not present

## 2019-06-02 ENCOUNTER — Telehealth: Payer: Self-pay | Admitting: Cardiovascular Disease

## 2019-06-02 NOTE — Telephone Encounter (Signed)
Pt reports substernal chest palpitations, tightness and SOB for 1 week.  0915- right before medication 117/72, HR 69 Noon 122/73, HR 70 2 PM 123/73, HR 70  Denies chest pain today, but has SOB, vision changes and dizziness. He feels that cholesterol medications effect his vision.   Pt feels that BP has been less than normal and could be contributing to sx.   Pt went mtn hiking over the past weekend and went working out in the garden today. Felt worse after exertion but felt fine when doing activity. Bending over makes him feel dizzy.   Pt does feel he has not been drinking adequate amount. He is trying to drink more water.   Pt has appt with Ignacia Bayley, NP on Wednesday at 9:30 AM.   Advised pt that he may get a call back this afternoon or tomorrow am. If sx change or worsen, he should seek urgent medical attention.   Routed to provider to advise any further orders needed before appt.

## 2019-06-02 NOTE — Telephone Encounter (Signed)
per patient he is having chest pains, shortness of breath, dizziness. The chest pains have been going for a week, haven't experienced any today but some fluttering over the week. He stated that the shortness of breath and dizziness are common.  He has been scheduled for Wednesday.

## 2019-06-02 NOTE — Progress Notes (Signed)
Cardiology Office Note Date:  06/04/2019  Patient ID:  BEREKET Tanner, DOB 01/18/1951, MRN [id number] PCP:  Rusty Aus, MD  Cardiologist:  Dr. Rockey Situ, MD    Chief Complaint: Chest pain  History of Present Illness: Bobby Tanner is a 68 y.o. male with history of CAD with anterior ST elevation MI in 03/2016 status post PCI/DES to the proximal LAD complicated by VF arrest x2 requiring multiple defibrillations and chest compressions, HFrEF secondary to ICM, diabetes, hypertension, hyperlipidemia, asthma, obesity, OSA compliant with CPAP, and GERD who presents for evaluation of chest pain.  Patient was admitted to the hospital in 03/2016 with an anterior ST elevation MI complicated by VF arrest x2 requiring multiple defibrillations, chest compressions, amiodarone, and epinephrine.  He underwent cardiac cath which revealed severe thrombotic disease in the proximal LAD which was successfully treated with PCI/DES.  He had residual 50% stenosis in the first diagonal with minor irregularities throughout the LCx and RCA.  LV gram estimated the EF of 45 to 50% with mild anterior wall hypokinesis.  Echo at that time showed an EF of 50 to 55%, akinesis of the apical myocardium, hypokinesis of the anteroapical region, normal LV diastolic function, mild mitral regurgitation, normal RV systolic function, PASP normal.  Most recent ischemic evaluation via [facility] in 11/2016 showed no significant ischemia, normal wall motion, EF 65%, overall this was a low risk scan.  Notes indicate since his MI he has had some degree of intermittent chest discomfort.  He did briefly participate in cardiac rehab though was no longer able to afford this once his insurance changed.  He was seen in person most recently on 02/08/2018 and noted improvement in fatigue after he discontinued carvedilol and Micardis.  These medications continued to be held after his visit.  He was most recently seen in telemedicine follow-up on 02/20/2019  noting continued anxiety/stress at work with chronic rare episodes of tunnel vision that dated back to his MI.  He denied any chest pain and had stable chronic shortness of breath.  He felt like losartan was playing a role in his shortness of breath.  He was also concerned Lipitor may be playing a role in his symptoms.  It was recommended the patient could decrease his losartan by half or temporarily discontinue this medication and see if his symptoms improve.  Patient called our office on 7/6 noting a 1 week history of chest pain, shortness breath, palpitations, vision changes, and dizziness.  Documented vitals were stable with blood pressure ranging between 782-956 systolic over 21-30 diastolic with heart rate ranging from 69-70 bpm.  He felt like his Lipitor was affecting his vision.  He also felt like his blood pressure was too low and contributing to his symptoms.  It was noted the patient went mountain hiking over the prior weekend and working out in the garden and felt fine with activity though noted worsening of symptoms after exertion.  He comes in feeling about the same when compared to his prior visit.  He continues to note shortness of breath that he attributes exclusively to medications.  He indicates while he was hiking up a mountain this past weekend he was somewhat short of breath initially though as he continued his hike this resolved and he actually felt quite well exerting himself.  Following this, he began to notice some shortness of breath.  He denies any chest pain, palpitations, presyncope, or syncope.  He states when his blood pressure runs in the 1  teens to 756E systolic this is "too low for me" and he notes associated dizziness.  He prefers her his blood pressure to run in the 332R to 518 systolic range.  He states he is compliant with medications and has not yet taking his morning medications and denies any shortness of breath.  Of note, the cardiac medications which she attributes  precipitating his shortness of breath are losartan and Lipitor which he takes at nighttime.  He has had 2 mechanical falls within the past month.  The initial fall was while walking on a couple steps in his shed with associated dizziness.  The second fall was when he got his flip-flops caught up and actually stepped on 1 causing him to fall to the ground.  He did not hit his head or suffer LOC with either episode.  He has not needed any PRN Lasix for lower extremity swelling within the past 2 weeks.  Lower extremity swelling is stable.  Lastly, he asks if he may have peripheral arterial disease.  He states at nighttime he has difficulty resting his legs and notes some aching along of the midfoot, particularly right midfoot.  He stated when he was hiking up a mountain he was completely asymptomatic from a leg pain perspective.  Labs: 09/2018 - Hgb 15.0, potassium 4.2, serum creatinine 1.0, AST/ALT normal, albumin 4.0, A1c 6.3, LDL 66, TSH normal   Past Medical History:  Diagnosis Date   Arthritis    a. Hands   Asthma    a. had as a child. pollen could aggrevate but does not use inhalers   CAD (coronary artery disease)    a. 2014 c/p w/ reportedly nl cardiac w/u;  b. 03/2016 Ant STEMI c/b VT/VF requiring multiple defibs-->Cath/PCI: LM nl, LAD 99p w/ heavy thrombus (3.5 x 15 Xience [place]pine DES), D1 50, LCX min irregs throughout, RCA min irregs throughout, EF 45-50%   Cardiac arrest with ventricular fibrillation ([place])    a. 04/10/2016 VT/VF arrest x 2 in setting of anterior ST elevation MI requiring multiple defibs, chest compressions, amio, epi.   Cataract    a. 03/2016 s/p R cataract extraction and IOL placement.   Diet-controlled diabetes mellitus ([place])    Essential hypertension    GERD (gastroesophageal reflux disease)    Hemorrhoids    HOH (hard of hearing)    Hyperlipidemia    Ischemic cardiomyopathy    a. 03/2016 LV Gram: EF 45-50%;  b. 03/2014 Echo: EF 50-55%, apical AK,  anteroapical HK, mild MR.   Morbid obesity ([place])    Obstructive sleep apnea    a. Compliant w/ CPAP.   Orthopnea    a. Improved with CPAP.   Skin cancer    a. Basal cell carcinoma of neck s/p resection.   ST elevation myocardial infarction (STEMI) of anterior wall ([place]) 04/10/2016   Wears hearing aid in both ears     Past Surgical History:  Procedure Laterality Date   CARDIAC CATHETERIZATION N/A 04/10/2016   Procedure: Left Heart Cath and Coronary Angiography;  Surgeon: [place] Hampshire, MD;  Location: [facility];  Service: Cardiovascular;  Laterality: N/A;   CARDIAC CATHETERIZATION N/A 04/10/2016   Procedure: Coronary Stent Intervention;  Surgeon: [place] Hampshire, MD;  Location: [facility];  Service: Cardiovascular;  Laterality: N/A;   CATARACT EXTRACTION W/PHACO Right 04/03/2016   Procedure: CATARACT EXTRACTION PHACO AND INTRAOCULAR LENS PLACEMENT (IOC);  Surgeon: Estill Cotta, MD;  Location: [facility];  Service: Ophthalmology;  Laterality:  Right;  [place] 01:13 AP% 25.8 CDE 34.18 fluid pack lot #[id number]   CATARACT EXTRACTION W/PHACO Left 06/18/2018   Procedure: CATARACT EXTRACTION PHACO AND INTRAOCULAR LENS PLACEMENT ([place]) LEFT DIABETES IVA TOPICAL;  Surgeon: Leandrew Koyanagi, MD;  Location: [facility];  Service: Ophthalmology;  Laterality: Left;  Diabetic - diet controlled   COLONOSCOPY  2014   COLONOSCOPY WITH PROPOFOL N/A 09/11/2018   Procedure: COLONOSCOPY WITH PROPOFOL;  Surgeon: [place], Benay Pike, MD;  Location: [facility];  Service: Gastroenterology;  Laterality: N/A;   CORONARY ANGIOPLASTY     EYE SURGERY     skin cancers     head    Current Meds  Medication Sig   aspirin EC 81 MG tablet Take 81 mg by mouth daily.   atorvastatin (LIPITOR) 80 MG tablet Take 1 tablet (80 mg total) by mouth daily at 6 PM.   cetirizine (ZYRTEC ALLERGY) 10 MG tablet Take 1 tablet (10 mg total) by mouth daily.   clopidogrel (PLAVIX) 75 MG  tablet TAKE 1 TABLET BY MOUTH DAILY   Contact Lens Care Products [facility]) MISC by Other route as needed.   Fluticasone-Salmeterol (ADVAIR DISKUS) 250-50 MCG/DOSE AEPB Inhale 1 puff into the lungs 2 (two) times daily.   furosemide (LASIX) 20 MG tablet Take 1 tablet (20 mg total) by mouth daily as needed.   lansoprazole (PREVACID) 30 MG capsule Take 30 mg by mouth daily.   losartan (COZAAR) 25 MG tablet Take 1 tablet (25 mg total) by mouth daily.   metFORMIN (GLUCOPHAGE) 500 MG tablet Take 500 mg by mouth daily with breakfast.   mometasone (NASONEX) 50 MCG/ACT nasal spray USE TWO SPRAYS IN EACH NOSTRIL DAILY   nitroGLYCERIN (NITROSTAT) 0.4 MG SL tablet Place 1 tablet (0.4 mg total) under the tongue every 5 (five) minutes x 3 doses as needed for chest pain.   potassium chloride (K-DUR) 10 MEQ tablet Take 1 tablet (10 mEq total) by mouth daily as needed.   zolpidem (AMBIEN) 10 MG tablet Take 5 mg by mouth at bedtime as needed for sleep.    Allergies:   Other and Pollen extract   Social History:  The patient  reports that he has never smoked. He has never used smokeless tobacco. He reports current alcohol use. He reports that he does not use drugs.   Family History:  The patient's family history includes Alzheimer's disease in his mother; CAD in his father; Hypertension in his brother, father, and paternal grandfather.  ROS:   Review of Systems  Constitutional: Positive for malaise/fatigue. Negative for chills, diaphoresis, fever and weight loss.  HENT: Negative for congestion.   Eyes: Negative for discharge and redness.  Respiratory: Positive for shortness of breath. Negative for cough, hemoptysis, sputum production and wheezing.   Cardiovascular: Positive for leg swelling. Negative for chest pain, palpitations, orthopnea, claudication and PND.  Gastrointestinal: Negative for abdominal pain, blood in stool, heartburn, melena, nausea and vomiting.  Genitourinary: Negative for  hematuria.  Musculoskeletal: Negative for falls and myalgias.  Skin: Negative for rash.  Neurological: Positive for weakness. Negative for dizziness, tingling, tremors, sensory change, speech change, focal weakness and loss of consciousness.  Endo/Heme/Allergies: Does not bruise/bleed easily.  Psychiatric/Behavioral: Negative for substance abuse. The patient is nervous/anxious.   All other systems reviewed and are negative.    PHYSICAL EXAM:  VS:  BP 122/80 (BP Location: Left Arm, Patient Position: Sitting, Cuff Size: Large)    Pulse 72    Temp 98.1 F (36.7 C)  Ht 5\' 7"  (1.702 m)    Wt 284 lb 4 oz (128.9 kg)    SpO2 97%    BMI 44.52 kg/m  BMI: Body mass index is 44.52 kg/m.  Physical Exam  Constitutional: He is oriented to person, place, and time. He appears well-developed and well-nourished.  HENT:  Head: Normocephalic and atraumatic.  Eyes: Right eye exhibits no discharge. Left eye exhibits no discharge.  Neck: Normal range of motion. No JVD present.  Cardiovascular: Normal rate, regular rhythm, S1 normal, S2 normal and normal heart sounds. Exam reveals no distant heart sounds, no friction rub, no midsystolic click and no opening snap.  No murmur heard. Pulses:      Posterior tibial pulses are 2+ on the right side and 2+ on the left side.  Pulmonary/Chest: Effort normal and breath sounds normal. No respiratory distress. He has no decreased breath sounds. He has no wheezes. He has no rales. He exhibits no tenderness.  Abdominal: Soft. He exhibits no distension. There is no abdominal tenderness.  Musculoskeletal:        General: Edema present.     Comments: Trace bilateral pretibial edema  Neurological: He is alert and oriented to person, place, and time.  Skin: Skin is warm and dry. No cyanosis. Nails show no clubbing.  Psychiatric: He has a normal mood and affect. His speech is normal and behavior is normal. Judgment and thought content normal.     EKG:  Was ordered and  interpreted by me today. Shows NSR, 72 bpm, low voltage QRS, poor R wave progression along the precordial leads, prior anterior infarct pattern, no acute st/t changes  Recent Labs: No results found for requested labs within last 8760 hours.  No results found for requested labs within last 8760 hours.   CrCl cannot be calculated (Patient's most recent lab result is older than the maximum 21 days allowed.).   Wt Readings from Last 3 Encounters:  06/04/19 284 lb 4 oz (128.9 kg)  01/14/19 293 lb (132.9 kg)  10/28/18 295 lb (133.8 kg)     Other studies reviewed: Additional studies/records reviewed today include: summarized above  ASSESSMENT AND PLAN:  1. CAD involving the native coronary arteries with stable angina: He is doing well without any symptoms concerning for angina or consistent with his prior MI.  Continue dual antiplatelet therapy with aspirin and Plavix.  Transition from Lipitor to Crestor as below.  No longer on beta-blocker or ARB as outlined below.  Aggressive risk factor modification and secondary prevention.  2. HFrEF secondary to ICM: Most recent echo from 03/2016 showed low-normal EF with Myoview from 11/2016 showing an EF of 65%.  He appears grossly euvolemic and well compensated.  He is no longer on beta-blocker secondary to patient preference with suspected dyspnea related to beta-blocker.  We are holding losartan as outlined below secondary to patient's suspected dyspnea adverse effect.  He remains on as needed Lasix.  Check echo as outlined below given chronic dyspnea.  3. Chronic dyspnea: This is a longstanding issue for him and predates his MI.  It is likely multifactorial including morbid obesity, physical deconditioning, mild ischemic cardiomyopathy, and some component of CAD.  I do not think his symptoms are exclusively related to medications.  Consideration for referral to pulmonology for PFTs should be undertaken and will be deferred to patient's PCP.  Weight loss is  advised as outlined below.  Obtain echo.  4. HTN: Blood pressure is well controlled today as above, however the patient  states this is low for him.  He prefers to have a blood pressure in the 366K to 159 systolic range and states he feels dizzy/lightheaded with blood pressure in the 1 teens to 120 range.  In the setting of his blood pressure preference and with him associating his chronic dyspnea with losartan we have agreed to temporarily hold losartan and see if this improves his symptoms.  He has been given precautions to call our office if blood pressure becomes significantly elevated greater than 470 systolic.  If his chronic dyspnea persists I recommend he resume losartan.  5. HLD: Most recent LDL of 66 from 09/2018.  Patient is quite adamant is chronic dyspnea is related to his medications and from a cardiac perspective his losartan and Lipitor.  In this setting, we will transition him from Lipitor to Crestor 10 mg daily and see if this helps with his chronic dyspnea.  6. Bilateral leg pain: Symptoms are not consistent with claudication and appear to be more so consistent with neuropathy.  Obtain lower extremity ABIs.  If these are unrevealing I recommend he follow-up with PCP.  7. Morbid obesity/OSA: Compared to his most recent in-person visit, his weight is down from 301-->284 pounds today. Continued weight loss is advised. Continued compliance with CPAP recommended.   Disposition: F/u with Dr. Rockey Situ or an APP in 2 months.  Current medicines are reviewed at length with the patient today.  The patient did not have any concerns regarding medicines.  Signed, Christell Faith, PA-C 06/04/2019 9:27 AM     [facility] [address] [place], [place] [address] [phone]

## 2019-06-03 NOTE — Telephone Encounter (Signed)
Returned call to patient. Discussed POC from Ignacia Bayley, NP. Pt verbalized understanding and had no further questions at this time.

## 2019-06-03 NOTE — Telephone Encounter (Signed)
Vital signs look good.  Agree w/ office f/u as planned or ED eval for recurrent/worsening Ss.

## 2019-06-03 NOTE — Telephone Encounter (Signed)
Left the pt a message to call the [facility] office back, to endorse recommendations per Ignacia Bayley NP.

## 2019-06-04 ENCOUNTER — Other Ambulatory Visit: Payer: Self-pay

## 2019-06-04 ENCOUNTER — Encounter: Payer: Self-pay | Admitting: Physician Assistant

## 2019-06-04 ENCOUNTER — Ambulatory Visit (INDEPENDENT_AMBULATORY_CARE_PROVIDER_SITE_OTHER): Payer: BC Managed Care – PPO | Admitting: Physician Assistant

## 2019-06-04 VITALS — BP 122/80 | HR 72 | Temp 98.1°F | Ht 67.0 in | Wt 284.2 lb

## 2019-06-04 DIAGNOSIS — R0609 Other forms of dyspnea: Secondary | ICD-10-CM

## 2019-06-04 DIAGNOSIS — R5382 Chronic fatigue, unspecified: Secondary | ICD-10-CM

## 2019-06-04 DIAGNOSIS — E785 Hyperlipidemia, unspecified: Secondary | ICD-10-CM | POA: Diagnosis not present

## 2019-06-04 DIAGNOSIS — I25118 Atherosclerotic heart disease of native coronary artery with other forms of angina pectoris: Secondary | ICD-10-CM

## 2019-06-04 DIAGNOSIS — M79604 Pain in right leg: Secondary | ICD-10-CM

## 2019-06-04 DIAGNOSIS — G4733 Obstructive sleep apnea (adult) (pediatric): Secondary | ICD-10-CM

## 2019-06-04 DIAGNOSIS — Z9989 Dependence on other enabling machines and devices: Secondary | ICD-10-CM

## 2019-06-04 DIAGNOSIS — I10 Essential (primary) hypertension: Secondary | ICD-10-CM | POA: Diagnosis not present

## 2019-06-04 DIAGNOSIS — R0600 Dyspnea, unspecified: Secondary | ICD-10-CM

## 2019-06-04 DIAGNOSIS — M79605 Pain in left leg: Secondary | ICD-10-CM

## 2019-06-04 MED ORDER — NITROGLYCERIN 0.4 MG SL SUBL: 0.4000 mg | SUBLINGUAL_TABLET | SUBLINGUAL | 3 refills | Status: DC | PRN

## 2019-06-04 MED ORDER — ROSUVASTATIN CALCIUM 10 MG PO TABS: 10.0000 mg | ORAL_TABLET | Freq: Every day | ORAL | 3 refills | Status: DC

## 2019-06-04 NOTE — Patient Instructions (Addendum)
Medication Instructions:  - Your physician has recommended you make the following change in your medication:   1) Hold losartan for 2 weeks to see if symptoms improve  2) Stop lipitor (atorvastatin)  3) Start crestor (rousuvastatin) 10 mg- take 1 tablet by mouth once daily  If you need a refill on your cardiac medications before your next appointment, please call your pharmacy.   Lab work: - none ordered  If you have labs (blood work) drawn today and your tests are completely normal, you will receive your results only by: Marland Kitchen MyChart Message (if you have MyChart) OR . A paper copy in the mail If you have any lab test that is abnormal or we need to change your treatment, we will call you to review the results.  Testing/Procedures: - Your physician has requested that you have an echocardiogram. Echocardiography is a painless test that uses sound waves to create images of your heart. It provides your doctor with information about the size and shape of your heart and how well your heart's chambers and valves are working. This procedure takes approximately one hour. There are no restrictions for this procedure.  -Your physician has requested that you have an ankle brachial index (ABI). During this test an ultrasound and blood pressure cuff are used to evaluate the arteries that supply the arms and legs with blood. Allow thirty minutes for this exam. There are no restrictions or special instructions.   - Your physician has requested that you have a lower extremity arterial duplex. This test is an ultrasound of the arteries in the legs. It looks at arterial blood flow in the legs. Allow one hour for Lower Arterial scans. There are no restrictions or special instructions   Follow-Up: At [facility], you and your health needs are our priority.  As part of our continuing mission to provide you with exceptional heart care, we have created designated Provider Care Teams.  These Care Teams include  your primary Cardiologist (physician) and Advanced Practice Providers (APPs -  Physician Assistants and Nurse Practitioners) who all work together to provide you with the care you need, when you need it. . 2 months with Dr. Dayton Martes, PA  Any Other Special Instructions Will Be Listed Below (If Applicable). - Please contact your primary care doctor regarding COVID-19 screening   Ankle-Brachial Index Test Why am I having this test? The ankle-brachial index (ABI) test is used to diagnose peripheral vascular disease (PVD). PVD is also known as peripheral arterial disease (PAD). PVD is the blocking or hardening of the arteries anywhere within the circulatory system beyond the heart. PVD is caused by:  Cholesterol deposits in your blood vessels (atherosclerosis). This is the most common cause of this condition.  Irritation and swelling (inflammation) in the blood vessels.  Blood clots in the vessels. Cholesterol deposits cause arteries to narrow. Normal delivery of oxygen to your tissues is affected, causing muscle pain and fatigue. This is called claudication. PVD means that there may also be a buildup of cholesterol:  In your heart. This increases the risk of heart attacks.  In your brain. This increases the risk of strokes. What is being tested? The ankle-brachial index test measures the blood flow in your arms and legs. The blood flow will show if blood vessels in your legs have been narrowed by cholesterol deposits. How do I prepare for this test?  Wear loose clothing.  Do not use any tobacco products, including cigarettes, chewing tobacco, or e-cigarettes, for  at least 30 minutes before the test. What happens during the test?  1. You will lie down in a resting position. 2. Your health care provider will use a blood pressure machine and a small ultrasound device (Doppler) to measure the systolic pressures on your upper arms and ankles. Systolic pressure is the pressure inside  your arteries when your heart pumps. 3. Systolic pressure measurements will be taken several times, and at several points, on both the ankle and the arm. 4. Your health care provider will divide the highest systolic pressure of the ankle by the highest systolic pressure of the arm. The result is the ankle-brachial pressure ratio, or ABI. Sometimes this test will be repeated after you have exercised on a treadmill for 5 minutes. You may have leg pain during the exercise portion of the test if you suffer from PVD. If the index number drops after exercise, this may show that PVD is present. How are the results reported? Your test results will be reported as a value that shows the ratio of your ankle pressure to your arm pressure (ABI ratio). Your health care provider will compare your results to normal ranges that were established after testing a large group of people (reference ranges). Reference ranges may vary among labs and hospitals. For this test, a common reference range is:  ABI ratio of 0.9 to 1.3. What do the results mean? An ABI ratio that is below the reference range is considered abnormal and may indicate PVD in the legs. Talk with your health care provider about what your results mean. Questions to ask your health care provider Ask your health care provider, or the department that is doing the test:  When will my results be ready?  How will I get my results?  What are my treatment options?  What other tests do I need?  What are my next steps? Summary  The ankle-brachial index (ABI) test is used to diagnose peripheral vascular disease (PVD). PVD is also known as peripheral arterial disease (PAD).  The ankle-brachial index test measures the blood flow in your arms and legs.  The highest systolic pressure of the ankle is divided by the highest systolic pressure of the arm. The result is the ABI ratio.  An ABI ratio that is below 0.9 is considered abnormal and may indicate PVD  in the legs. This information is not intended to replace advice given to you by your health care provider. Make sure you discuss any questions you have with your health care provider. Document Released: 11/17/2004 Document Revised: 09/05/2018 Document Reviewed: 08/07/2017 Elsevier Patient Education  [employer].    Echocardiogram An echocardiogram is a procedure that uses painless sound waves (ultrasound) to produce an image of the heart. Images from an echocardiogram can provide important information about:  Signs of coronary artery disease (CAD).  Aneurysm detection. An aneurysm is a weak or damaged part of an artery wall that bulges out from the normal force of blood pumping through the body.  Heart size and shape. Changes in the size or shape of the heart can be associated with certain conditions, including heart failure, aneurysm, and CAD.  Heart muscle function.  Heart valve function.  Signs of a past heart attack.  Fluid buildup around the heart.  Thickening of the heart muscle.  A tumor or infectious growth around the heart valves. Tell a health care provider about:  Any allergies you have.  All medicines you are taking, including vitamins, herbs, eye drops,  creams, and over-the-counter medicines.  Any blood disorders you have.  Any surgeries you have had.  Any medical conditions you have.  Whether you are pregnant or may be pregnant. What are the risks? Generally, this is a safe procedure. However, problems may occur, including:  Allergic reaction to dye (contrast) that may be used during the procedure. What happens before the procedure? No specific preparation is needed. You may eat and drink normally. What happens during the procedure?   An IV tube may be inserted into one of your veins.  You may receive contrast through this tube. A contrast is an injection that improves the quality of the pictures from your heart.  A gel will be applied to your  chest.  A wand-like tool (transducer) will be moved over your chest. The gel will help to transmit the sound waves from the transducer.  The sound waves will harmlessly bounce off of your heart to allow the heart images to be captured in real-time motion. The images will be recorded on a computer. The procedure may vary among health care providers and hospitals. What happens after the procedure?  You may return to your normal, everyday life, including diet, activities, and medicines, unless your health care provider tells you not to do that. Summary  An echocardiogram is a procedure that uses painless sound waves (ultrasound) to produce an image of the heart.  Images from an echocardiogram can provide important information about the size and shape of your heart, heart muscle function, heart valve function, and fluid buildup around your heart.  You do not need to do anything to prepare before this procedure. You may eat and drink normally.  After the echocardiogram is completed, you may return to your normal, everyday life, unless your health care provider tells you not to do that. This information is not intended to replace advice given to you by your health care provider. Make sure you discuss any questions you have with your health care provider. Document Released: 11/10/2000 Document Revised: 03/06/2019 Document Reviewed: 12/16/2016 Elsevier Patient Education  2020 [employer].

## 2019-06-05 DIAGNOSIS — R0602 Shortness of breath: Secondary | ICD-10-CM | POA: Diagnosis not present

## 2019-06-10 ENCOUNTER — Other Ambulatory Visit: Payer: Self-pay | Admitting: Cardiovascular Disease

## 2019-06-14 ENCOUNTER — Other Ambulatory Visit: Payer: Self-pay | Admitting: Cardiovascular Disease

## 2019-06-27 DIAGNOSIS — E782 Mixed hyperlipidemia: Secondary | ICD-10-CM | POA: Diagnosis not present

## 2019-06-27 DIAGNOSIS — N410 Acute prostatitis: Secondary | ICD-10-CM | POA: Diagnosis not present

## 2019-06-27 DIAGNOSIS — R399 Unspecified symptoms and signs involving the genitourinary system: Secondary | ICD-10-CM | POA: Diagnosis not present

## 2019-06-27 DIAGNOSIS — E1151 Type 2 diabetes mellitus with diabetic peripheral angiopathy without gangrene: Secondary | ICD-10-CM | POA: Diagnosis not present

## 2019-06-30 ENCOUNTER — Other Ambulatory Visit: Payer: BC Managed Care – PPO

## 2019-07-02 ENCOUNTER — Ambulatory Visit (INDEPENDENT_AMBULATORY_CARE_PROVIDER_SITE_OTHER): Payer: BC Managed Care – PPO

## 2019-07-02 ENCOUNTER — Other Ambulatory Visit: Payer: Self-pay

## 2019-07-02 DIAGNOSIS — M79605 Pain in left leg: Secondary | ICD-10-CM | POA: Diagnosis not present

## 2019-07-02 DIAGNOSIS — M79604 Pain in right leg: Secondary | ICD-10-CM

## 2019-07-02 DIAGNOSIS — R0600 Dyspnea, unspecified: Secondary | ICD-10-CM

## 2019-07-02 DIAGNOSIS — R0609 Other forms of dyspnea: Secondary | ICD-10-CM

## 2019-07-03 ENCOUNTER — Telehealth: Payer: Self-pay | Admitting: Cardiovascular Disease

## 2019-07-03 NOTE — Telephone Encounter (Signed)
I called the patient with his echo results. Per Bobby Faith, PA:  Notes recorded by Rise Mu, PA-C on 07/02/2019 at 7:24 PM EDT  Echo showed normal pump function, normal relaxation of the heart, normal wall motion, mild enlarged atria bilaterally, mildly leaky aortic valve, normal size and structure of the aorta. Reassuring echo without explanation for his symptoms. Consideration for PFTs can be considered in follow up or with his PCP. Mildly leaky aortic valve can be monitored periodically.  The patient proceeded to tell me that he thinks the statin medications effect his breathing. He was on lipitor and this was changed to crestor due to his breathing. He states he stopped all of her medications about 3 weeks ago and added them back one at a time and the crestor is what seemed to make his breathing worse.   He is asking if there is an alternative to lipitor/ crestor. I advised there are other statin drugs to try, but he may have the same effects with these and they may not control his lipids as well. I advised I will forward to Dr. Rockey Tanner to review and we will have to call him back next week with further recommendations.   I have also advised that he could hold crestor until we call him back if he feels this may help his breathing.   The patient voices understanding and is agreeable.

## 2019-07-04 NOTE — Telephone Encounter (Signed)
He can try zetia 10 mg daily with lovastatin 20 daily May not be strong enough, but we can try Statin likely not causing SOB based on previously reported side effects

## 2019-07-07 NOTE — Telephone Encounter (Signed)
Patient calling back. He verbalized understanding of Dr Donivan Scull recommendations. He has an appointment with Dr Mortimer Fries (pulmonology) later this week and would like to wait and discuss with him before making the switch in his medications. Advised patient to call us back if this is what he would like to do.

## 2019-07-07 NOTE — Telephone Encounter (Signed)
No answer. Left message to call back.   

## 2019-07-23 ENCOUNTER — Ambulatory Visit (INDEPENDENT_AMBULATORY_CARE_PROVIDER_SITE_OTHER): Payer: BC Managed Care – PPO | Admitting: Internal Medicine

## 2019-07-23 ENCOUNTER — Encounter: Payer: Self-pay | Admitting: Internal Medicine

## 2019-07-23 DIAGNOSIS — J4520 Mild intermittent asthma, uncomplicated: Secondary | ICD-10-CM

## 2019-07-23 DIAGNOSIS — I25118 Atherosclerotic heart disease of native coronary artery with other forms of angina pectoris: Secondary | ICD-10-CM

## 2019-07-23 DIAGNOSIS — G4733 Obstructive sleep apnea (adult) (pediatric): Secondary | ICD-10-CM

## 2019-07-23 NOTE — Progress Notes (Addendum)
Name: Bobby Tanner MRN: [id number] DOB: 03-28-1951   CONSULTATION DATE:07/23/2019   I connected with the patient by VIDEO telemedicine visit and verified that I am speaking with the correct person using two identifiers.    I discussed the limitations, risks, security and privacy concerns of performing an evaluation and management service by telemedicine and the availability of in-person appointments. I also discussed with the patient that there may be a patient responsible charge related to this service. The patient expressed understanding and agreed to proceed.  PATIENT AGREES AND CONFIRMS -YES   Other persons participating in the visit and their role in the encounter: Patient, nursing  This visit type was conducted due to national recommendations for restrictions regarding the COVID-19 Pandemic (e.g. social distancing).  This format is felt to be most appropriate for this patient at this time.  All issues noted in this document were discussed and addressed.        REFERRING MD :Dr. 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   CHIEF COMPLAINT:  Follow up ASTHMA Follow up OSA   HISTORY OF PRESENT ILLNESS: Previous OV has had asthma exacerbation Responded well to prednisone and ABX   Well controlled a this time Some SOB and DOE with heat and outdoors  Taking advair one puff at night No signs of infections at this time No wheezing Not coughing  Triggers are smoke,humidity,dogs,grasstakes zyrtec at night  H/o CAD sees Dr Rockey Situ On ASA/plavix  Dx of OSA Great compliance Uses daily autoCPAP 9-11 cm h20 at night  Uses nasal pillows Problems with sealing   Review of Systems:  Gen:  Denies  fever, sweats, chills weigh loss  HEENT: Denies blurred vision, double vision, ear pain, eye pain, hearing loss, nose bleeds, sore throat Cardiac:  No dizziness, chest pain  or heaviness, chest tightness,edema, No JVD Resp:   No cough, -sputum production, -shortness of breath,-wheezing, -hemoptysis,  Gi: Denies swallowing difficulty, stomach pain, nausea or vomiting, diarrhea, constipation, bowel incontinence Gu:  Denies bladder incontinence, burning urine Ext:   Denies Joint pain, stiffness or swelling Skin: Denies  skin rash, easy bruising or bleeding or hives Endoc:  Denies polyuria, polydipsia , polyphagia or weight change Psych:   Denies depression, insomnia or hallucinations  Other:  All other systems negative    Physical Examination:  Well nourished, well developed male in no acute distress. No Obvious Respiratory Distress noted EOMI intact, NO obvious oral lesions Facial Skin intact-no facial rash noted CN 3-12 intact   Insight, judgment intact. -depression -anxiety              ASSESSMENT / PLAN:   OSA is well treated  He uses and benefits from CPAP blood pressure meds are removed   ASTHMA MILD intermittent  advair once daily only albuterol as needed  no need for steroids or ABX   Obesity -recommend significant weight loss -recommend changing diet  Deconditioned state -Recommend increased daily activity and exercise    Follow up in 6 months for interval assessment of ASTHMA COVID-19 EDUCATION: The signs and symptoms of COVID-19 were discussed with the patient and how to seek care for testing.  The importance of social distancing was discussed today. Hand Washing Techniques and avoid touching face was advised.  MEDICATION ADJUSTMENTS/LABS AND TESTS ORDERED: Needs DME company that wife uses advair once daily Albuterol as needed Avoid triggers Recommend weight loss  CURRENT MEDICATIONS REVIEWED AT LENGTH WITH PATIENT TODAY   Patient satisfied with Plan of action and management. All questions answered  Follow up in 6 months  Total Time SPent 38 mins  Edilia Ghuman Patricia Pesa, M.D.  [facility]  Medical Director [place] Director [facility] Cardio-Pulmonary Department

## 2019-07-23 NOTE — Addendum Note (Signed)
Addended by: Maryanna Shape A on: 07/23/2019 02:29 PM   Modules accepted: Orders

## 2019-07-23 NOTE — Patient Instructions (Addendum)
Paient needs DME company for CPAP machine-Please look into [facility] as prescribed  Obtain Compliance report  Continue inhalers as prescribed

## 2019-07-28 DIAGNOSIS — H43812 Vitreous degeneration, left eye: Secondary | ICD-10-CM | POA: Diagnosis not present

## 2019-08-07 DIAGNOSIS — K5901 Slow transit constipation: Secondary | ICD-10-CM | POA: Diagnosis not present

## 2019-08-07 DIAGNOSIS — H532 Diplopia: Secondary | ICD-10-CM | POA: Diagnosis not present

## 2019-08-09 ENCOUNTER — Other Ambulatory Visit: Payer: Self-pay | Admitting: Cardiovascular Disease

## 2019-08-11 DIAGNOSIS — H532 Diplopia: Secondary | ICD-10-CM | POA: Diagnosis not present

## 2019-08-11 DIAGNOSIS — H53452 Other localized visual field defect, left eye: Secondary | ICD-10-CM | POA: Diagnosis not present

## 2019-08-11 DIAGNOSIS — H04121 Dry eye syndrome of right lacrimal gland: Secondary | ICD-10-CM | POA: Diagnosis not present

## 2019-08-11 DIAGNOSIS — H43812 Vitreous degeneration, left eye: Secondary | ICD-10-CM | POA: Diagnosis not present

## 2019-08-18 DIAGNOSIS — H903 Sensorineural hearing loss, bilateral: Secondary | ICD-10-CM | POA: Diagnosis not present

## 2019-08-18 DIAGNOSIS — H6980 Other specified disorders of Eustachian tube, unspecified ear: Secondary | ICD-10-CM | POA: Diagnosis not present

## 2019-08-18 DIAGNOSIS — M26609 Unspecified temporomandibular joint disorder, unspecified side: Secondary | ICD-10-CM | POA: Diagnosis not present

## 2019-09-02 DIAGNOSIS — D485 Neoplasm of uncertain behavior of skin: Secondary | ICD-10-CM | POA: Diagnosis not present

## 2019-09-02 DIAGNOSIS — Z85828 Personal history of other malignant neoplasm of skin: Secondary | ICD-10-CM | POA: Diagnosis not present

## 2019-09-02 DIAGNOSIS — D2371 Other benign neoplasm of skin of right lower limb, including hip: Secondary | ICD-10-CM | POA: Diagnosis not present

## 2019-09-02 DIAGNOSIS — D224 Melanocytic nevi of scalp and neck: Secondary | ICD-10-CM | POA: Diagnosis not present

## 2019-09-02 DIAGNOSIS — L821 Other seborrheic keratosis: Secondary | ICD-10-CM | POA: Diagnosis not present

## 2019-09-02 DIAGNOSIS — X32XXXA Exposure to sunlight, initial encounter: Secondary | ICD-10-CM | POA: Diagnosis not present

## 2019-09-02 DIAGNOSIS — L570 Actinic keratosis: Secondary | ICD-10-CM | POA: Diagnosis not present

## 2019-09-02 DIAGNOSIS — Z08 Encounter for follow-up examination after completed treatment for malignant neoplasm: Secondary | ICD-10-CM | POA: Diagnosis not present

## 2019-09-03 DIAGNOSIS — Z23 Encounter for immunization: Secondary | ICD-10-CM | POA: Diagnosis not present

## 2019-09-03 DIAGNOSIS — H6983 Other specified disorders of Eustachian tube, bilateral: Secondary | ICD-10-CM | POA: Diagnosis not present

## 2019-09-05 DIAGNOSIS — Z961 Presence of intraocular lens: Secondary | ICD-10-CM | POA: Diagnosis not present

## 2019-09-05 DIAGNOSIS — H532 Diplopia: Secondary | ICD-10-CM | POA: Diagnosis not present

## 2019-09-05 DIAGNOSIS — H04121 Dry eye syndrome of right lacrimal gland: Secondary | ICD-10-CM | POA: Diagnosis not present

## 2019-09-18 DIAGNOSIS — H6980 Other specified disorders of Eustachian tube, unspecified ear: Secondary | ICD-10-CM | POA: Diagnosis not present

## 2019-09-18 DIAGNOSIS — H903 Sensorineural hearing loss, bilateral: Secondary | ICD-10-CM | POA: Diagnosis not present

## 2019-10-08 DIAGNOSIS — E1151 Type 2 diabetes mellitus with diabetic peripheral angiopathy without gangrene: Secondary | ICD-10-CM | POA: Diagnosis not present

## 2019-10-08 DIAGNOSIS — Z125 Encounter for screening for malignant neoplasm of prostate: Secondary | ICD-10-CM | POA: Diagnosis not present

## 2019-10-08 DIAGNOSIS — Z0000 Encounter for general adult medical examination without abnormal findings: Secondary | ICD-10-CM | POA: Diagnosis not present

## 2019-10-21 DIAGNOSIS — M5116 Intervertebral disc disorders with radiculopathy, lumbar region: Secondary | ICD-10-CM | POA: Diagnosis not present

## 2019-11-04 ENCOUNTER — Other Ambulatory Visit: Payer: Self-pay | Admitting: Internal Medicine

## 2019-11-04 DIAGNOSIS — M5416 Radiculopathy, lumbar region: Secondary | ICD-10-CM

## 2019-11-17 ENCOUNTER — Other Ambulatory Visit: Payer: Self-pay

## 2019-11-17 ENCOUNTER — Ambulatory Visit
Admission: RE | Admit: 2019-11-17 | Discharge: 2019-11-17 | Disposition: A | Discharge status: Home / Self Care | Payer: BC Managed Care – PPO | Source: Ambulatory Visit | Attending: Internal Medicine | Admitting: Internal Medicine

## 2019-11-17 DIAGNOSIS — M5416 Radiculopathy, lumbar region: Secondary | ICD-10-CM

## 2019-12-02 ENCOUNTER — Ambulatory Visit: Payer: BC Managed Care – PPO | Attending: Internal Medicine

## 2019-12-02 DIAGNOSIS — Z20822 Contact with and (suspected) exposure to covid-19: Secondary | ICD-10-CM

## 2019-12-04 LAB — NOVEL CORONAVIRUS, NAA: SARS-CoV-2, NAA: NOT DETECTED

## 2020-01-02 ENCOUNTER — Other Ambulatory Visit: Payer: BC Managed Care – PPO

## 2020-01-12 ENCOUNTER — Ambulatory Visit
Admission: RE | Admit: 2020-01-12 | Discharge: 2020-01-12 | Disposition: A | Discharge status: Home / Self Care | Payer: BC Managed Care – PPO | Source: Ambulatory Visit | Attending: Internal Medicine | Admitting: Internal Medicine

## 2020-01-12 ENCOUNTER — Other Ambulatory Visit: Payer: Self-pay

## 2020-01-12 ENCOUNTER — Other Ambulatory Visit: Payer: Self-pay | Admitting: Internal Medicine

## 2020-01-12 DIAGNOSIS — R0609 Other forms of dyspnea: Secondary | ICD-10-CM

## 2020-01-12 DIAGNOSIS — R0600 Dyspnea, unspecified: Secondary | ICD-10-CM | POA: Insufficient documentation

## 2020-01-12 DIAGNOSIS — J1282 Pneumonia due to coronavirus disease 2019: Secondary | ICD-10-CM | POA: Diagnosis present

## 2020-01-12 DIAGNOSIS — U071 COVID-19: Secondary | ICD-10-CM

## 2020-01-12 LAB — POCT I-STAT CREATININE: Creatinine, Ser: 1 mg/dL (ref 0.61–1.24)

## 2020-01-12 MED ORDER — IOHEXOL 350 MG/ML SOLN
75.0000 mL | Freq: Once | INTRAVENOUS | Status: AC | PRN
  Administered 2020-01-12: 15:00:00 75 mL via INTRAVENOUS

## 2020-01-26 ENCOUNTER — Other Ambulatory Visit: Payer: Self-pay | Admitting: Ophthalmology

## 2020-01-26 DIAGNOSIS — H510 Palsy (spasm) of conjugate gaze: Secondary | ICD-10-CM

## 2020-01-28 ENCOUNTER — Telehealth: Payer: Self-pay | Admitting: Cardiovascular Disease

## 2020-01-28 NOTE — Telephone Encounter (Signed)
*  STAT* If patient is at the pharmacy, call can be transferred to refill team.   1. Which medications need to be refilled? (please list name of each medication and dose if known) clopidogrel 75 MG 1 tablet every day  2. Which pharmacy/location (including street and city if local pharmacy) is medication to be sent to? [facility]   3. Do they need a 30 day or 90 day supply? 90 day

## 2020-01-29 NOTE — Telephone Encounter (Signed)
Attempted to schedule.  LMOV to call office.  ° °

## 2020-01-29 NOTE — Telephone Encounter (Signed)
Please schedule overdue F/U with Dr. Gollan. Thank you! 

## 2020-02-02 MED ORDER — CLOPIDOGREL BISULFATE 75 MG PO TABS: 75.0000 mg | ORAL_TABLET | Freq: Every day | ORAL | 0 refills | Status: DC

## 2020-02-02 NOTE — Telephone Encounter (Signed)
Requested Prescriptions   Signed Prescriptions Disp Refills   clopidogrel (PLAVIX) 75 MG tablet 90 tablet 0    Sig: Take 1 tablet (75 mg total) by mouth daily.    Authorizing Provider: GOLLAN, TIMOTHY J    Ordering User: NEWCOMER MCCLAIN, Westen Dinino L    

## 2020-02-02 NOTE — Telephone Encounter (Signed)
Patient has been scheduled for an appointment on 3/15 and waiting for medication to be refilled

## 2020-02-04 ENCOUNTER — Ambulatory Visit: Payer: BC Managed Care – PPO

## 2020-02-09 ENCOUNTER — Ambulatory Visit: Payer: BC Managed Care – PPO | Admitting: Cardiovascular Disease

## 2020-06-01 ENCOUNTER — Telehealth: Payer: Self-pay | Admitting: Cardiovascular Disease

## 2020-06-01 NOTE — Telephone Encounter (Signed)
Pt c/o of Chest Pain: STAT if CP now or developed within 24 hours  1. Are you having CP right now? No, just heaviness  2. Are you experiencing any other symptoms (ex. SOB, nausea, vomiting, sweating)?  no 3. How long have you been experiencing CP? 15-20 minutes ago  4. Is your CP continuous or coming and going? Comes and goes  5. Have you taken Nitroglycerin? Yes, 10 minutes ago ?

## 2020-06-01 NOTE — Telephone Encounter (Signed)
Spoke with Patients wife Carlyon Shadow. She stated that Bobby Tanner started feeling some chest pain with heaviness while working out in the yard about 20 mins ago. He took one dose of Nitro 10 mins after that, and is still feeling the heaviness 10 mins later. He denies any other symptoms. I advised he take another dose of his Nitro now.  His heaviness did not subside. I advised that she take him into the ER and she agreed with the plan.

## 2021-01-06 ENCOUNTER — Telehealth: Payer: Self-pay | Admitting: Cardiovascular Disease

## 2021-01-06 NOTE — Telephone Encounter (Signed)
Patient declined recall changed providers.   Deleting recall.

## 2021-08-30 ENCOUNTER — Telehealth: Payer: Self-pay

## 2021-08-30 ENCOUNTER — Ambulatory Visit: Payer: BC Managed Care – PPO | Admitting: Dermatology

## 2021-08-30 NOTE — Telephone Encounter (Signed)
Patient given verbal pre-procedure instructions with verbalized understanding. Patient did request to switch his care to Dr. Fletcher Anon. Dr. Fletcher Anon took care of the patient after his past MI. Advised the patient that Dr. Fletcher Anon is agreeable. Post cath f/u scheduled on 09/19/21 @ 10:30 am with Christell Faith, PA. Patient is aware of our office location, appt date and time.  Patient verbalized understanding to the instructions given and voiced appreciation for the assistance.

## 2021-08-30 NOTE — Telephone Encounter (Signed)
  [facility], [place] [address] [address] Dept: [phone] Loc: [phone]  Bobby Tanner  08/30/2021  You are scheduled for a Cardiac Catheterization on Friday, October 7 with Dr. Kathlyn [place].  1. Please arrive at the [address] [facility] registration at 8:30 AM (This time is one hour before your procedure to ensure your preparation). Free valet parking service is available.   Special note: Every effort is made to have your procedure done on time. Please understand that emergencies sometimes delay scheduled procedures.  2. Diet: Do not eat solid foods after midnight.  The patient may have clear liquids until 5am upon the day of the procedure.  3. Labs: You will need to have blood drawn (Cbc, Bmp) done with [facility] Dr. Ubaldo Glassing   4. Medication instructions in preparation for your procedure:   Contrast Allergy: No   HOLD lasix and Potassium the morning of the procedure    Do not take Diabetes Med Glucophage (Metformin) on the day of the procedure and HOLD 48 HOURS AFTER THE PROCEDURE.  On the morning of your procedure, take your Aspirin and any morning medicines NOT listed above.  You may use sips of water.  5. Plan for one night stay--bring personal belongings. 6. Bring a current list of your medications and current insurance cards. 7. You MUST have a responsible person to drive you home. 8. Someone MUST be with you the first 24 hours after you arrive home or your discharge will be delayed. 9. Please wear clothes that are easy to get on and off and wear slip-on shoes.  Thank you for allowing [place] to care for you!   -- [place] [facility]

## 2021-08-30 NOTE — Telephone Encounter (Signed)
Spoke with the patient. He is agreeable with scheduling his cardiac cath this Friday 09/02/21 with Dr. Fletcher Anon. Adv the patient that I will call back with the procedure time and the pre-procedure instructions. Patient voiced appreciation.

## 2021-08-30 NOTE — Telephone Encounter (Signed)
Per Dr. Fletcher Anon secure chat  Lattie Haw, please schedule this patient for left heart catheterization possible PCI to be done at [facility] for stable angina.  I can do on Friday the seventh or Monday the 10th.  The patient was seen by Dr. Ubaldo Glassing today and is aware that we will be contacting him.  Thanks.

## 2021-09-02 ENCOUNTER — Encounter
Admission: RE | Disposition: A | Discharge status: Home / Self Care | Payer: Self-pay | Source: Home / Self Care | Attending: Cardiovascular Disease

## 2021-09-02 ENCOUNTER — Ambulatory Visit
Admission: RE | Admit: 2021-09-02 | Discharge: 2021-09-02 | Disposition: A | Discharge status: Home / Self Care | Payer: Medicare HMO | Attending: Cardiovascular Disease | Admitting: Cardiovascular Disease

## 2021-09-02 ENCOUNTER — Encounter: Payer: Self-pay | Admitting: Cardiovascular Disease

## 2021-09-02 ENCOUNTER — Other Ambulatory Visit: Payer: Self-pay

## 2021-09-02 DIAGNOSIS — E1151 Type 2 diabetes mellitus with diabetic peripheral angiopathy without gangrene: Secondary | ICD-10-CM | POA: Diagnosis not present

## 2021-09-02 DIAGNOSIS — I2510 Atherosclerotic heart disease of native coronary artery without angina pectoris: Secondary | ICD-10-CM

## 2021-09-02 DIAGNOSIS — I25118 Atherosclerotic heart disease of native coronary artery with other forms of angina pectoris: Secondary | ICD-10-CM | POA: Insufficient documentation

## 2021-09-02 DIAGNOSIS — N529 Male erectile dysfunction, unspecified: Secondary | ICD-10-CM | POA: Diagnosis not present

## 2021-09-02 DIAGNOSIS — Z7984 Long term (current) use of oral hypoglycemic drugs: Secondary | ICD-10-CM | POA: Insufficient documentation

## 2021-09-02 DIAGNOSIS — Z8249 Family history of ischemic heart disease and other diseases of the circulatory system: Secondary | ICD-10-CM | POA: Diagnosis not present

## 2021-09-02 DIAGNOSIS — I10 Essential (primary) hypertension: Secondary | ICD-10-CM | POA: Diagnosis not present

## 2021-09-02 DIAGNOSIS — Z955 Presence of coronary angioplasty implant and graft: Secondary | ICD-10-CM | POA: Insufficient documentation

## 2021-09-02 DIAGNOSIS — G4733 Obstructive sleep apnea (adult) (pediatric): Secondary | ICD-10-CM | POA: Insufficient documentation

## 2021-09-02 DIAGNOSIS — Z7982 Long term (current) use of aspirin: Secondary | ICD-10-CM | POA: Diagnosis not present

## 2021-09-02 HISTORY — PX: LEFT HEART CATH AND CORONARY ANGIOGRAPHY: CATH118249

## 2021-09-02 HISTORY — PX: INTRAVASCULAR PRESSURE WIRE/FFR STUDY: CATH118243

## 2021-09-02 LAB — GLUCOSE, CAPILLARY: Glucose-Capillary: 133 mg/dL — ABNORMAL HIGH (ref 70–99)

## 2021-09-02 LAB — POCT ACTIVATED CLOTTING TIME: Activated Clotting Time: 254 seconds

## 2021-09-02 SURGERY — LEFT HEART CATH AND CORONARY ANGIOGRAPHY
Anesthesia: Moderate Sedation

## 2021-09-02 MED ORDER — HEPARIN SODIUM (PORCINE) 1000 UNIT/ML IJ SOLN
INTRAMUSCULAR | Status: DC | PRN
  Administered 2021-09-02 (×2): 5000 [IU] via INTRAVENOUS

## 2021-09-02 MED ORDER — SODIUM CHLORIDE 0.9 % IV SOLN: 250.0000 mL | INTRAVENOUS | Status: DC | PRN

## 2021-09-02 MED ORDER — VERAPAMIL HCL 2.5 MG/ML IV SOLN
INTRAVENOUS | Status: AC
  Filled 2021-09-02: qty 2

## 2021-09-02 MED ORDER — ONDANSETRON HCL 4 MG/2ML IJ SOLN: 4.0000 mg | Freq: Four times a day (QID) | INTRAMUSCULAR | Status: DC | PRN

## 2021-09-02 MED ORDER — HEPARIN (PORCINE) IN NACL 1000-0.9 UT/500ML-% IV SOLN
INTRAVENOUS | Status: DC | PRN
  Administered 2021-09-02: 1000 mL

## 2021-09-02 MED ORDER — MIDAZOLAM HCL 2 MG/2ML IJ SOLN
INTRAMUSCULAR | Status: AC
  Filled 2021-09-02: qty 2

## 2021-09-02 MED ORDER — LABETALOL HCL 5 MG/ML IV SOLN: 10.0000 mg | INTRAVENOUS | Status: DC | PRN

## 2021-09-02 MED ORDER — LIDOCAINE HCL 1 % IJ SOLN
INTRAMUSCULAR | Status: AC
  Filled 2021-09-02: qty 20

## 2021-09-02 MED ORDER — HEPARIN (PORCINE) IN NACL 1000-0.9 UT/500ML-% IV SOLN
INTRAVENOUS | Status: AC
  Filled 2021-09-02: qty 1000

## 2021-09-02 MED ORDER — MIDAZOLAM HCL 2 MG/2ML IJ SOLN
INTRAMUSCULAR | Status: DC | PRN
  Administered 2021-09-02: 1 mg via INTRAVENOUS

## 2021-09-02 MED ORDER — IOHEXOL 350 MG/ML SOLN
INTRAVENOUS | Status: DC | PRN
  Administered 2021-09-02: 76 mL

## 2021-09-02 MED ORDER — SODIUM CHLORIDE 0.9% FLUSH: 3.0000 mL | Freq: Two times a day (BID) | INTRAVENOUS | Status: DC

## 2021-09-02 MED ORDER — LIDOCAINE HCL (PF) 1 % IJ SOLN
INTRAMUSCULAR | Status: DC | PRN
  Administered 2021-09-02: 2 mL

## 2021-09-02 MED ORDER — SODIUM CHLORIDE 0.9% FLUSH: 3.0000 mL | INTRAVENOUS | Status: DC | PRN

## 2021-09-02 MED ORDER — VERAPAMIL HCL 2.5 MG/ML IV SOLN
INTRAVENOUS | Status: DC | PRN
  Administered 2021-09-02: 2.5 mg via INTRAVENOUS

## 2021-09-02 MED ORDER — ACETAMINOPHEN 325 MG PO TABS: 650.0000 mg | ORAL_TABLET | ORAL | Status: DC | PRN

## 2021-09-02 MED ORDER — FENTANYL CITRATE (PF) 100 MCG/2ML IJ SOLN
INTRAMUSCULAR | Status: DC | PRN
  Administered 2021-09-02: 50 ug via INTRAVENOUS

## 2021-09-02 MED ORDER — SODIUM CHLORIDE 0.9 % WEIGHT BASED INFUSION
3.0000 mL/kg/h | INTRAVENOUS | Status: AC
  Administered 2021-09-02: 3 mL/kg/h via INTRAVENOUS

## 2021-09-02 MED ORDER — FENTANYL CITRATE (PF) 100 MCG/2ML IJ SOLN
INTRAMUSCULAR | Status: AC
  Filled 2021-09-02: qty 2

## 2021-09-02 MED ORDER — HEPARIN SODIUM (PORCINE) 1000 UNIT/ML IJ SOLN
INTRAMUSCULAR | Status: AC
  Filled 2021-09-02: qty 1

## 2021-09-02 MED ORDER — SODIUM CHLORIDE 0.9 % WEIGHT BASED INFUSION
1.0000 mL/kg/h | INTRAVENOUS | Status: DC
  Administered 2021-09-02: 1 mL/kg/h via INTRAVENOUS

## 2021-09-02 MED ORDER — SODIUM CHLORIDE 0.9 % IV SOLN: INTRAVENOUS | Status: DC

## 2021-09-02 MED ORDER — ASPIRIN 81 MG PO CHEW: 81.0000 mg | CHEWABLE_TABLET | ORAL | Status: DC

## 2021-09-02 SURGICAL SUPPLY — 15 items
CATH INFINITI 5 FR JL3.5 (CATHETERS) ×3 IMPLANT
CATH INFINITI 5FR JK (CATHETERS) ×3 IMPLANT
CATH LAUNCHER 6FR JR4 (CATHETERS) ×3 IMPLANT
DEVICE RAD COMP TR BAND LRG (VASCULAR PRODUCTS) ×3 IMPLANT
DRAPE BRACHIAL (DRAPES) ×3 IMPLANT
GLIDESHEATH SLEND SS 6F .021 (SHEATH) ×3 IMPLANT
GUIDEWIRE INQWIRE 1.5J.035X260 (WIRE) ×2 IMPLANT
GUIDEWIRE PRESS OMNI 185 ST (WIRE) ×3 IMPLANT
INQWIRE 1.5J .035X260CM (WIRE) ×3
KIT ENCORE 26 ADVANTAGE (KITS) ×3 IMPLANT
PACK CARDIAC CATH (CUSTOM PROCEDURE TRAY) ×3 IMPLANT
PROTECTION STATION PRESSURIZED (MISCELLANEOUS) ×3
SET ATX SIMPLICITY (MISCELLANEOUS) ×3 IMPLANT
STATION PROTECTION PRESSURIZED (MISCELLANEOUS) ×2 IMPLANT
TUBING CIL FLEX 10 FLL-RA (TUBING) ×3 IMPLANT

## 2021-09-02 NOTE — H&P (Signed)
Sydnee Levans, MD - 08/30/2021 11:45 AM EDT Formatting of this note is different from the original. Images from the original note were not included.   Chief Complaint: Chief Complaint  Patient presents with   Follow-up  Chest pain(tightness) SOB   Edema  Bilateral ankles   Dizziness  Upon exertion  Date of Service: 08/30/2021 Date of Birth: June 21, 1951 PCP: Yevonne Pax, MD  History of Present Illness: Mr. Matters is a 70 y.o.male patient who presents for a follow-up visit. He has had several episodes of chest heaviness which he feels is similar to what he had prior to his PCI in 2017. He denies exertional change. His electrocardiogram today reveals sinus rhythm at a rate of 63 with a PR interval of 160 ms QRS duration 100 ms with a QTC of 421 ms QRS axis -65 degrees. He had a functional study 1 year ago which was unremarkable. He feels the pain is similar to what he had prior to the event in 2017. He has a history of coronary artery disease status post anterior myocardial infarction in May 2017 at which time he had placement of a drug-eluting stent in the proximal LAD. His MI course was complicated by a VF arrest x2 requiring defibrillations amiodarone and epinephrine. He had noncritical disease in his first diagonal and the remainder of his coronary tree. Ejection fraction was 45 to 50% with mild anterior wall hypokinesis at the time with similar findings on echo. He had a repeat echo last year showing preserved LV function with no wall motion abnormality.  Past Medical and Surgical History  Past Medical History Past Medical History:  Diagnosis Date   Arthritis 2015  Nothing   Asthma without status asthmaticus, unspecified Childhood  No episodes for several years.   Cancer (CMS-HCC) Last 6 years  Skin Cancers   Cataract cortical, senile 2017  Right eye cat was removed   DM type 2 with diabetic mixed hyperlipidemia (CMS-HCC) 04/08/2018   GERD (gastroesophageal reflux disease)  04/09/2014   Heart disease   Hx of vertigo   Hyperlipidemia 04/09/2014   Hypertension 10 years  Controlled by medication.   Myocardial infarction (CMS-HCC) 04/10/2016   OSA (obstructive sleep apnea) 04/09/2014   Past Surgical History He has a past surgical history that includes Cataract extraction (Right, 04/05/2016); Coronary angioplasty (Apr 10, 2016); and Colonoscopy (09/11/2018).   Medications and Allergies  Current Medications  Current Outpatient Medications  Medication Sig Dispense Refill   albuterol 90 mcg/actuation inhaler Inhale 2 inhalations into the lungs every 4 (four) hours as needed for Wheezing or Shortness of Breath 6.7 g 0   aspirin 81 MG EC tablet Take 81 mg by mouth once daily.   azelastine (ASTELIN) 137 mcg nasal spray Place 1 spray into both nostrils 2 (two) times daily 10 mL 1   gabapentin (NEURONTIN) 300 MG capsule Take 1 capsule (300 mg total) by mouth 2 (two) times daily 2 a day per patient . 180 capsule 3   lansoprazole (PREVACID) 30 MG DR capsule Take 1 capsule (30 mg total) by mouth once daily 90 capsule 3   metFORMIN (GLUCOPHAGE) 500 MG tablet TAKE 1 TABLET BY MOUTH TWICE A DAY 180 tablet 2   nitroGLYcerin (NITROSTAT) 0.4 MG SL tablet Place 1 tablet (0.4 mg total) under the tongue every 5 (five) minutes as needed for Chest pain May take up to 3 doses. 25 tablet 0   rosuvastatin (CRESTOR) 10 MG tablet Take 1 tablet (10 mg total) by  mouth once daily 90 tablet 3   zolpidem (AMBIEN) 10 mg tablet Take 1 tablet (10 mg total) by mouth nightly 90 tablet 1   diclofenac (VOLTAREN) 75 MG EC tablet Take 1 tablet (75 mg total) by mouth once daily as needed (arthritis) (Patient not taking: Reported on 08/30/2021) 90 tablet 2   isosorbide mononitrate (IMDUR) 30 MG ER tablet Take 1 tablet (30 mg total) by mouth once daily 30 tablet 11   triamcinolone 0.1 % cream Apply topically 2 (two) times daily Do not use longer than 2 consecutive weeks. (Patient not taking: Reported on  08/30/2021) 80 g 0   No current facility-administered medications for this visit.   Allergies: Grass pollen, Other, and Tree and shrub pollen  Social and Family History  Social History reports that he has never smoked. He has never used smokeless tobacco. He reports current alcohol use. He reports that he does not use drugs.  Family History Family History  Problem Relation Age of Onset   Alzheimer's disease Mother   Coronary Artery Disease (Blocked arteries around heart) Mother   High blood pressure (Hypertension) Mother   Skin cancer Mother   Stroke Mother   Macular degeneration Mother   Coronary Artery Disease (Blocked arteries around heart) Father   High blood pressure (Hypertension) Father   High blood pressure (Hypertension) Brother   Cancer Maternal Aunt   Cancer Maternal Grandfather   High blood pressure (Hypertension) Paternal Grandfather   Glaucoma Neg Hx   Review of Systems  Review of Systems  Constitutional: Negative for chills, diaphoresis, fever, malaise/fatigue and weight loss.  HENT: Negative for congestion, ear discharge, hearing loss and tinnitus.  Eyes: Negative for blurred vision.  Respiratory: Positive for shortness of breath. Negative for cough, hemoptysis, sputum production and wheezing.  Cardiovascular: Negative for chest pain, palpitations, orthopnea, claudication, leg swelling and PND.  Gastrointestinal: Negative for abdominal pain, blood in stool, constipation, diarrhea, heartburn, melena, nausea and vomiting.  Genitourinary: Negative for dysuria, frequency, hematuria and urgency.  Musculoskeletal: Negative for back pain, falls, joint pain and myalgias.  Skin: Negative for itching and rash.  Neurological: Negative for dizziness, tingling, focal weakness, loss of consciousness, weakness and headaches.  Endo/Heme/Allergies: Negative for polydipsia. Does not bruise/bleed easily.  Psychiatric/Behavioral: Negative for depression, memory loss and substance  abuse. The patient is not nervous/anxious.   Physical Examination   Vitals:BP 128/72  Pulse 69  Ht 180.3 cm (5\' 11" )  Wt (!) 127 kg (280 lb)  SpO2 96%  BMI 39.05 kg/m  Ht:180.3 cm (5\' 11" ) Wt:(!) 127 kg (280 lb) PNT:IRWE surface area is 2.52 meters squared. Body mass index is 39.05 kg/m.  Wt Readings from Last 3 Encounters:  08/30/21 (!) 127 kg (280 lb)  06/17/21 (!) 129 kg (284 lb 6.3 oz)  05/02/21 (!) 129.5 kg (285 lb 9.6 oz)   BP Readings from Last 3 Encounters:  08/30/21 128/72  06/17/21 (!) 146/82  05/02/21 130/80   General appearance appears in no acute distress  Head Mouth and Eye exam Normocephalic, without obvious abnormality, atraumatic Dentition is good Eyes appear anicteric       LUNGS Breath Sounds: Normal Percussion: Normal  CARDIOVASCULAR JVP CV wave: no HJR: no Elevation at 90 degrees: None Carotid Pulse: normal pulsation bilaterally Bruit: None Apex: apical impulse normal  Auscultation Rhythm: normal sinus rhythm S1: normal S2: normal Clicks: no Rub: no Murmurs: no murmurs  Gallop: None  EXTREMITIES Clubbing: no Edema: trace to 1+ bilateral pedal edema Pulses: peripheral  pulses symmetrical Femoral Bruits: no Amputation: no SKIN Rash: no Cyanosis: no Embolic phemonenon: no Bruising: no NEURO Alert and Oriented to person, place and time: yes Non focal: yes  PSYCH: Pt appears to have normal affect  LABS REVIEWED Last 3 CBC results: Lab Results  Component Value Date  WBC 10.1 10/04/2020  WBC 11.5 (H) 01/16/2020  WBC 17.4 (H) 01/12/2020   Lab Results  Component Value Date  HGB 15.2 04/26/2021  HGB 15.7 10/04/2020  HGB 14.9 03/30/2020   Lab Results  Component Value Date  HCT 48.0 10/04/2020  HCT 42.3 01/16/2020  HCT 43.5 01/12/2020   Lab Results  Component Value Date  PLT 217 10/04/2020  PLT 263 01/16/2020  PLT 172 01/12/2020   Lab Results  Component Value Date  CREATININE 1.1 04/26/2021  BUN 19  04/26/2021  NA 140 04/26/2021  K 4.7 04/26/2021  CL 107 04/26/2021  CO2 26.7 04/26/2021   Lab Results  Component Value Date  HGBA1C 6.6 (H) 04/26/2021   Lab Results  Component Value Date  HDL 39.7 04/26/2021  HDL 45.2 10/04/2020  HDL 47.0 03/30/2020   Lab Results  Component Value Date  LDLCALC 79 04/26/2021  LDLCALC 66 10/04/2020  LDLCALC 84 03/30/2020   Lab Results  Component Value Date  TRIG 184 04/26/2021  TRIG 169 10/04/2020  TRIG 110 03/30/2020   Lab Results  Component Value Date  ALT 16 04/26/2021  AST 19 04/26/2021  ALKPHOS 84 04/26/2021   Lab Results  Component Value Date  TSH 3.248 10/04/2020   Diagnostic Studies Reviewed:  EKG EKG demonstrated normal sinus rhythm, nonspecific ST and T waves changes.  Assessment and Plan   70 y.o. male with  ICD-10-CM  1. Type 2 diabetes mellitus with peripheral angiopathy (CMS-HCC)-we will continue with current regimen. Weight loss and ADA diet are recommended. E11.51  2. 2-vessel coronary artery disease-status post PCI of the LAD. Had some residual disease in his diagonal. EKG shows no acute injury however symptoms are very similar to his angina. We will add 30 mg of Imdur continue with aspirin and refer for cardiac cath. Patient advised to go the emergency room should he have a severe episode of pain prior to this being carried out. I25.10  3. Essential hypertension with goal blood pressure less than 140/90-blood pressure appears to be in good control. He is on furosemide as needed. I10  4. OSA (obstructive sleep apnea)-CPAP use. Follow-up with pulmonary as you are doing. Patient prefers to be off his bronchodilators if possible. G47.33   5. Erectile dysfunction at his request will give a prescription for Cialis. Long discussion regarding avoiding nitrates when taking Cialis.  Return in about 4 weeks (around 09/27/2021) for Dr. Kathlyn [place].  These notes generated with voice recognition software. I apologize for  typographical errors.  Sydnee Levans, MD   Electronically signed by Sydnee Levans, MD at 08/30/2021 12:26 PM EDT   Addendum on 09/02/2021: The patient is well-known to me from previous encounter in 2017 when he presented with ventricular fibrillation and anterior STEMI.  At that time, he was treated successfully with PCI and drug-eluting stent placement to the proximal LAD.  Ejection fraction was mildly reduced but subsequently normalized.  He was seen recently by Dr. Ubaldo Glassing with increased episodes of chest pain.  By physical exam, heart is regular with 1 out of 6 systolic murmur in the aortic area.  Lungs are clear to auscultation.  Radial pulses normal.  Recommendations: Coronary artery disease involving  native coronary arteries with other forms of angina: Known history of coronary artery disease with previous drug-eluting stent placement to the LAD and residual moderate first diagonal disease.  The patient is scheduled for left heart catheterization possible PCI.  I discussed the procedure in details as well as risk and benefits.

## 2021-09-05 ENCOUNTER — Encounter: Payer: Self-pay | Admitting: Cardiovascular Disease

## 2021-09-18 NOTE — Progress Notes (Signed)
Cardiology Office Note    Date:  09/19/2021   ID:  Bobby Tanner, DOB 1950/12/29, MRN [id number]  PCP:  Rusty Aus, MD  Cardiologist:  None  Electrophysiologist:  None   Chief Complaint: LHC follow up  History of Present Illness:   Bobby Tanner is a 70 y.o. male with history of CAD with anterior ST elevation MI in 03/2016 status post PCI/DES to the proximal LAD complicated by VF arrest x2 requiring multiple defibrillations and chest compressions, HFpEF, diabetes, HTN, HLD, asthma, obesity, OSA on CPAP, and GERD who presents for follow-up of LHC.  He was admitted to the hospital in 03/2016 with an anterior ST elevation MI complicated by VF arrest x2 requiring multiple defibrillations, chest compressions, amiodarone, and epinephrine.  He underwent cardiac cath which revealed severe thrombotic disease in the proximal LAD which was successfully treated with PCI/DES.  He had residual 50% stenosis in the first diagonal with minor irregularities throughout the LCx and RCA.  LV gram estimated the EF of 45 to 50% with mild anterior wall hypokinesis.  Echo at that time showed an EF of 50 to 55%, akinesis of the apical myocardium, hypokinesis of the anteroapical region, normal LV diastolic function, mild mitral regurgitation, normal RV systolic function, PASP normal.  He underwent Lexiscan Myoview in 11/2016 that showed no significant ischemia, normal wall motion, EF 65%, overall this was a low risk scan.  Notes indicate since his MI he has had some degree of intermittent chest discomfort.  He was last seen in our office in 05/2019, with continued shortness of breath that he attributed to medications. Echo in 06/2019, demonstrated an EF of 60 to 65%, mildly increased LV wall thickness, normal LV diastolic function parameters, normal wall motion, normal RV systolic function and ventricular cavity size, mild biatrial enlargement, mild aortic insufficiency, and normal size and structure aorta.   He  established care with Dr. Ubaldo Glassing, with [facility], in 01/2020, most recently seeing him on 08/30/2021 with complaints of exertional angina that felt prior to his anginal symptoms.  Given this, he underwent diagnostic LHC by Dr. Fletcher Anon on 09/02/2021, which showed a widely patent proximal LAD stent with no significant restenosis, stable moderate stenosis in the first diagonal, new moderate stenosis in the proximal RCA that was not significant by FFR, normal LV systolic function, and moderately elevated LVEDP felt to be likely due to diastolic dysfunction and uncontrolled hypertension.  Medical therapy and aggressive risk factor treatment was recommended.  He comes in doing well from a cardiac perspective.  He notes since undergoing LHC, particularly over the past couple of days, he has felt well, and better than he has historically.  He did initially have a headache with the initiation of Imdur, though this has resolved and he is tolerating this medication without issues at this time.  No angina.  He has chronic stable dyspnea.  No dizziness, presyncope, or syncope.  No issues from the right radial arteriotomy site.  He has home BP cuff is a wrist monitor and reports readings typically in the 433I to 951O systolic with this.  He prefers to minimize medications as able.  Labs independently reviewed: 08/2021 - potassium 4.4, BUN 14, serum creatinine 1.0, Hgb 14.8, PLT 191 03/2021 - A1c 6.6, TC 155, TG 184, HDL 39, LDL 79, AST/ALT normal, albumin 4.0 09/2020 - TSH normal  Past Medical History:  Diagnosis Date   Arthritis    a. Hands   Asthma    a.  had as a child. pollen could aggrevate but does not use inhalers   CAD (coronary artery disease)    a. 2014 c/p w/ reportedly nl cardiac w/u;  b. 03/2016 Ant STEMI c/b VT/VF requiring multiple defibs-->Cath/PCI: LM nl, LAD 99p w/ heavy thrombus (3.5 x 15 Xience Alpine DES), D1 50, LCX min irregs throughout, RCA min irregs throughout, EF 45-50%   Cardiac arrest  with ventricular fibrillation ([place])    a. 04/10/2016 VT/VF arrest x 2 in setting of anterior ST elevation MI requiring multiple defibs, chest compressions, amio, epi.   Cataract    a. 03/2016 s/p R cataract extraction and IOL placement.   Diet-controlled diabetes mellitus ([place])    Essential hypertension    GERD (gastroesophageal reflux disease)    Hemorrhoids    History of basal cell carcinoma (BCC) 07/25/2007   left nasal root/glabella area   HOH (hard of hearing)    Hyperlipidemia    Ischemic cardiomyopathy    a. 03/2016 LV Gram: EF 45-50%;  b. 03/2014 Echo: EF 50-55%, apical AK, anteroapical HK, mild MR.   Morbid obesity ([place])    Obstructive sleep apnea    a. Compliant w/ CPAP.   Orthopnea    a. Improved with CPAP.   Skin cancer    a. Basal cell carcinoma of neck s/p resection.   ST elevation myocardial infarction (STEMI) of anterior wall ([place]) 04/10/2016   Wears hearing aid in both ears     Past Surgical History:  Procedure Laterality Date   CARDIAC CATHETERIZATION N/A 04/10/2016   Procedure: Left Heart Cath and Coronary Angiography;  Surgeon: Wellington Hampshire, MD;  Location: [place] [facility];  Service: Cardiovascular;  Laterality: N/A;   CARDIAC CATHETERIZATION N/A 04/10/2016   Procedure: Coronary Stent Intervention;  Surgeon: Wellington Hampshire, MD;  Location: [place] [facility];  Service: Cardiovascular;  Laterality: N/A;   CATARACT EXTRACTION W/PHACO Right 04/03/2016   Procedure: CATARACT EXTRACTION PHACO AND INTRAOCULAR LENS PLACEMENT (IOC);  Surgeon: Estill Cotta, MD;  Location: [facility];  Service: Ophthalmology;  Laterality: Right;  [place] 01:13 AP% 25.8 CDE 34.18 fluid pack lot #[id number]   CATARACT EXTRACTION W/PHACO Left 06/18/2018   Procedure: CATARACT EXTRACTION PHACO AND INTRAOCULAR LENS PLACEMENT ([place]) LEFT DIABETES IVA TOPICAL;  Surgeon: Leandrew Koyanagi, MD;  Location: [place];  Service: Ophthalmology;  Laterality: Left;  Diabetic - diet controlled    COLONOSCOPY  2014   COLONOSCOPY WITH PROPOFOL N/A 09/11/2018   Procedure: COLONOSCOPY WITH PROPOFOL;  Surgeon: Toledo, Benay Pike, MD;  Location: [facility];  Service: Gastroenterology;  Laterality: N/A;   CORONARY ANGIOPLASTY     EYE SURGERY     INTRAVASCULAR PRESSURE WIRE/FFR STUDY N/A 09/02/2021   Procedure: INTRAVASCULAR PRESSURE WIRE/FFR STUDY;  Surgeon: Wellington Hampshire, MD;  Location: [place] [facility];  Service: Cardiovascular;  Laterality: N/A;   LEFT HEART CATH AND CORONARY ANGIOGRAPHY Left 09/02/2021   Procedure: LEFT HEART CATH AND CORONARY ANGIOGRAPHY;  Surgeon: Wellington Hampshire, MD;  Location: [place] [facility];  Service: Cardiovascular;  Laterality: Left;   skin cancers     head    Current Medications: Current Meds  Medication Sig   albuterol (VENTOLIN HFA) 108 (90 Base) MCG/ACT inhaler Inhale 1-2 puffs into the lungs every 6 (six) hours as needed for wheezing or shortness of breath.   aspirin EC 81 MG tablet Take 81 mg by mouth in the morning.   azelastine (ASTELIN) 0.1 % nasal spray Place 2 sprays into  the nose at bedtime as needed for allergies.   Carboxymethylcellulose Sodium (THERATEARS) 0.25 % SOLN Place 1-2 drops into both eyes 3 (three) times daily as needed (dry/irritated eyes).   Cyanocobalamin (VITAMIN B-12) 5000 MCG SUBL Take 5,000 mcg by mouth in the morning.   furosemide (LASIX) 20 MG tablet TAKE 1 TABLET BY MOUTH DAILY AS NEEDED (Patient taking differently: Take 20 mg by mouth daily as needed for fluid.)   gabapentin (NEURONTIN) 300 MG capsule Take 300 mg by mouth 3 (three) times daily.   isosorbide mononitrate (IMDUR) 30 MG 24 hr tablet Take 30 mg by mouth in the morning.   lansoprazole (PREVACID) 30 MG capsule Take 30 mg by mouth in the morning.   metFORMIN (GLUCOPHAGE) 500 MG tablet Take 500 mg by mouth in the morning and at bedtime.   nitroGLYCERIN (NITROSTAT) 0.4 MG SL tablet Place 1 tablet (0.4 mg total) under the tongue every 5 (five) minutes  x 3 doses as needed for chest pain.   rosuvastatin (CRESTOR) 20 MG tablet Take 1 tablet (20 mg total) by mouth daily.   zolpidem (AMBIEN) 10 MG tablet Take 10 mg by mouth at bedtime.   [DISCONTINUED] rosuvastatin (CRESTOR) 10 MG tablet Take 1 tablet (10 mg total) by mouth daily.    Allergies:   Pollen extract   Social History   Socioeconomic History   Marital status: Married    Spouse name: Not on file   Number of children: Not on file   Years of education: Not on file   Highest education level: Not on file  Occupational History   Not on file  Tobacco Use   Smoking status: Never   Smokeless tobacco: Never   Tobacco comments:    no smokers in  home  Vaping Use   Vaping Use: Never used  Substance and Sexual Activity   Alcohol use: Yes    Alcohol/week: 0.0 standard drinks    Comment: occasional drink every one or two weeks   Drug use: No   Sexual activity: Not on file  Other Topics Concern   Not on file  Social History Narrative   Lives in [place] with wife.  Does not routinely exercise.  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   Social Determinants of Health   Financial Resource Strain: Not on file  Food Insecurity: Not on file  Transportation Needs: Not on file  Physical Activity: Not on file  Stress: Not on file  Social Connections: Not on file     Family History:  The patient's family history includes Alzheimer's disease in his mother; CAD in his father; Hypertension in his brother, father, and paternal grandfather.  ROS:   Review of Systems  Constitutional:  Positive for malaise/fatigue. Negative for chills, diaphoresis, fever and weight loss.  HENT:  Negative for congestion.   Eyes:  Negative for discharge and redness.  Respiratory:  Positive for shortness of breath. Negative for cough, sputum production and wheezing.   Cardiovascular:  Negative for chest pain, palpitations, orthopnea, claudication, leg swelling and PND.  Gastrointestinal:  Negative for  abdominal pain, heartburn, nausea and vomiting.  Musculoskeletal:  Negative for falls and myalgias.  Skin:  Negative for rash.  Neurological:  Negative for dizziness, tingling, tremors, sensory change, speech change, focal weakness, loss of consciousness and weakness.  Endo/Heme/Allergies:  Does not bruise/bleed easily.  Psychiatric/Behavioral:  Negative for substance abuse. The patient is not nervous/anxious.   All other systems reviewed and are negative.   EKGs/Labs/Other Studies  Reviewed:    Studies reviewed were summarized above. The additional studies were reviewed today:  LHC 09/02/2021:   Prox RCA lesion is 60% stenosed.   1st Diag lesion is 40% stenosed.   Previously placed Prox LAD to Mid LAD stent (unknown type) is  widely patent.   The left ventricular systolic function is normal.   LV end diastolic pressure is moderately elevated.   The left ventricular ejection fraction is 55-65% by visual estimate.   1.  Widely patent proximal LAD stent with no significant restenosis.  Stable moderate stenosis in the first diagonal.  New moderate stenosis in the proximal right coronary artery not significant by fractional flow reserve evaluation. 2.  Normal LV systolic function. 3.  Moderately elevated left ventricular end-diastolic pressure likely due to diastolic dysfunction and uncontrolled hypertension.   Recommendations: Continue medical therapy for coronary artery disease.  Recommend aggressive treatment of risk factors with a target LDL of less than 55. The patient likely has some degree of diastolic dysfunction causing exertional dyspnea.  We will have to address blood pressure control upon follow-up. __________  2D echo 06/2019: 1. The left ventricle has normal systolic function with an ejection  fraction of 60-65%. The cavity size was normal. There is mildly increased  left ventricular wall thickness. Left ventricular diastolic parameters  were normal. No evidence of left   ventricular regional wall motion abnormalities.   2. The right ventricle has normal systolic function. The cavity was  normal. There is no increase in right ventricular wall thickness. Right  ventricular systolic pressure could not be assessed.   3. Left atrial size was mildly dilated.   4. Right atrial size was mildly dilated.   5. The tricuspid valve is grossly normal.   6. The aortic valve has an indeterminate number of cusps. Mild thickening  of the aortic valve. Aortic valve regurgitation is mild by color flow  Doppler.   7. The aorta is normal in size and structure.   8. The inferior vena cava was dilated in size with >50% respiratory  variability.   9. The interatrial septum was not well visualized. __________  [facility] 11/2016: Pharmacological myocardial perfusion imaging study with no significant  ischemia Normal wall motion, EF estimated at 65% No EKG changes concerning for ischemia at peak stress or in recovery. Low risk scan __________  2D echo 03/2016: - Left ventricle: The cavity size was normal. Systolic function was    normal. The estimated ejection fraction was in the range of 50%    to 55%. Akinesis of the apical myocardium, hypokinesis of the    anteroapical region. Left ventricular diastolic function    parameters were normal.  - Mitral valve: There was mild regurgitation.  - Right ventricle: Systolic function was normal.  - Pulmonary arteries: Systolic pressure was within the normal    range.  __________  [facility] 03/2016: 1st Diag lesion, 50% stenosed. Prox LAD lesion, 99% stenosed. Post intervention, there is a 0% residual stenosis. The lesion was not previously treated. The left ventricular systolic function is normal.   1. Severe one-vessel coronary artery disease with heavy thrombotic lesion in the proximal LAD. Otherwise no obstructive disease. 2. Mildly reduced LV systolic function with mild anterior wall hypokinesis. Moderately to severely elevated  left ventricular end-diastolic pressure. 3. Successful aspiration thrombectomy and drug-eluting stent placement to the proximal LAD.   Recommendations: Given the patient's recurrent episodes of vomiting, I elected to switch him to Aggrastat infusion for 12 hours.  Continue dual antiplatelet therapy for at least one year. Aggressive treatment of risk factors.    EKG:  EKG is ordered today.  The EKG ordered today demonstrates NSR, 63 bpm, left anterior fascicular block, poor R wave progression along the precordial leads, no acute ST-T changes  Recent Labs: No results found for requested labs within last 8760 hours.  Recent Lipid Panel    Component Value Date/Time   CHOL 189 04/11/2016 0648   TRIG 100 04/11/2016 0648   HDL 37 (L) 04/11/2016 0648   CHOLHDL 5.1 04/11/2016 0648   VLDL 20 04/11/2016 0648   LDLCALC 132 (H) 04/11/2016 0648    PHYSICAL EXAM:    VS:  BP (!) 144/78 (BP Location: Left Arm, Patient Position: Sitting, Cuff Size: Large)   Pulse 63   Ht 5\' 4"  (1.626 m)   Wt 283 lb 4 oz (128.5 kg)   SpO2 98%   BMI 48.62 kg/m   BMI: Body mass index is 48.62 kg/m.  Physical Exam Constitutional:      Appearance: He is well-developed.  HENT:     Head: Normocephalic and atraumatic.  Eyes:     General:        Right eye: No discharge.        Left eye: No discharge.  Neck:     Vascular: No JVD.  Cardiovascular:     Rate and Rhythm: Normal rate and regular rhythm.     Pulses:          Posterior tibial pulses are 2+ on the right side and 2+ on the left side.     Heart sounds: Normal heart sounds, S1 normal and S2 normal. Heart sounds not distant. No midsystolic click and no opening snap. No murmur heard.   No friction rub.     Comments: Right radial arteriotomy site is well-healing without bleeding, bruising, swelling, warmth, erythema, or tenderness to palpation.  Radial pulse 2+ proximal and distal to the arteriotomy site. Pulmonary:     Effort: Pulmonary effort is normal.  No respiratory distress.     Breath sounds: Normal breath sounds. No decreased breath sounds, wheezing or rales.  Chest:     Chest wall: No tenderness.  Abdominal:     General: There is no distension.     Palpations: Abdomen is soft.     Tenderness: There is no abdominal tenderness.  Musculoskeletal:     Cervical back: Normal range of motion.     Right lower leg: No edema.     Left lower leg: No edema.  Skin:    General: Skin is warm and dry.     Nails: There is no clubbing.  Neurological:     Mental Status: He is alert and oriented to person, place, and time.  Psychiatric:        Speech: Speech normal.        Behavior: Behavior normal.        Thought Content: Thought content normal.        Judgment: Judgment normal.    Wt Readings from Last 3 Encounters:  09/19/21 283 lb 4 oz (128.5 kg)  09/02/21 283 lb (128.4 kg)  06/04/19 284 lb 4 oz (128.9 kg)     ASSESSMENT & PLAN:   CAD involving the native coronary arteries without angina: He is doing well without symptoms concerning for angina.  LHC was discussed with him in detail with recommendation to optimize medical therapy for RCA stenosis with a normal iFR noted.  Continue aspirin and Imdur.  Titrate rosuvastatin to 20 mg daily in an effort to achieve goal LDL of less than 55 as outlined below.  No indication for further ischemic testing at this time.  HFpEF: He appears euvolemic and well compensated.  His dyspnea has been felt to be multifactorial including mild underlying asthma, diastolic dysfunction which is likely in the setting of elevated BP readings, obesity, and physical deconditioning.  He remains on as needed furosemide.  Consider updating echo in follow-up.  HTN: Blood pressure is mildly elevated in the office today.  He was encouraged to pick up a brachial artery sphygmomanometer and compare this with his wrist BP cuff readings.  He was also advised to bring in his new brachial artery BP cuff to his next visit for  comparison to the office readings.  If he continues to have elevated BP readings on a brachial cuff, we will look to escalate hypertensive therapy with possible addition of ARB.  He remains on Imdur as outlined above.  Low-sodium diet is encouraged.  HLD: LDL 79 in 03/2021 with goal being less than 55 per interventional cardiology.  Titrate rosuvastatin to 20 mg daily.  Look to check a fasting lipid panel and LFT in approximately 2 months.  Obesity with OSA: Weight loss is encouraged through heart healthy diet.  Continued CPAP use is recommended.  This was not discussed in detail at today's visit.  Disposition: F/u with Dr. Rockey Situ or an APP in 2 months.   Medication Adjustments/Labs and Tests Ordered: Current medicines are reviewed at length with the patient today.  Concerns regarding medicines are outlined above. Medication changes, Labs and Tests ordered today are summarized above and listed in the Patient Instructions accessible in Encounters.   Signed, Christell Faith, PA-C 09/19/2021 12:15 PM     [address] [place] [address] [place], [place] [address] [phone]

## 2021-09-19 ENCOUNTER — Ambulatory Visit: Payer: Medicare HMO | Admitting: Physician Assistant

## 2021-09-19 ENCOUNTER — Other Ambulatory Visit: Payer: Self-pay

## 2021-09-19 VITALS — BP 144/78 | HR 63 | Ht 64.0 in | Wt 283.2 lb

## 2021-09-19 DIAGNOSIS — I2510 Atherosclerotic heart disease of native coronary artery without angina pectoris: Secondary | ICD-10-CM

## 2021-09-19 DIAGNOSIS — I10 Essential (primary) hypertension: Secondary | ICD-10-CM

## 2021-09-19 DIAGNOSIS — I5032 Chronic diastolic (congestive) heart failure: Secondary | ICD-10-CM

## 2021-09-19 DIAGNOSIS — E785 Hyperlipidemia, unspecified: Secondary | ICD-10-CM

## 2021-09-19 DIAGNOSIS — Z6841 Body Mass Index (BMI) 40.0 and over, adult: Secondary | ICD-10-CM

## 2021-09-19 DIAGNOSIS — E66813 Obesity, class 3: Secondary | ICD-10-CM

## 2021-09-19 DIAGNOSIS — G4733 Obstructive sleep apnea (adult) (pediatric): Secondary | ICD-10-CM

## 2021-09-19 MED ORDER — ROSUVASTATIN CALCIUM 20 MG PO TABS: 20.0000 mg | ORAL_TABLET | Freq: Every day | ORAL | 3 refills | Status: DC

## 2021-09-19 NOTE — Patient Instructions (Addendum)
Medication Instructions:  Your physician has recommended you make the following change in your medication:   INCREASE Crestor (Rosuvastatin) to 20 mg once daily  *If you need a refill on your cardiac medications before your next appointment, please call your pharmacy*   Lab Work: None  If you have labs (blood work) drawn today and your tests are completely normal, you will receive your results only by: [place] (if you have MyChart) OR A paper copy in the mail If you have any lab test that is abnormal or we need to change your treatment, we will call you to review the results.   Testing/Procedures: None   Follow-Up: At [facility], you and your health needs are our priority.  As part of our continuing mission to provide you with exceptional heart care, we have created designated Provider Care Teams.  These Care Teams include your primary Cardiologist (physician) and Advanced Practice Providers (APPs -  Physician Assistants and Nurse Practitioners) who all work together to provide you with the care you need, when you need it.   Your next appointment:   2 month(s)  The format for your next appointment:   In Person  Provider:   You may see Dr. Esmond Plants or one of the following Advanced Practice Providers on your designated Care Team:   Murray Hodgkins, NP Christell Faith, PA-C Marrianne Mood, PA-C Cadence Glasgow, [place]

## 2021-09-21 NOTE — Addendum Note (Signed)
Addended by: Othelia Pulling C on: 09/21/2021 10:10 AM   Modules accepted: Orders

## 2021-10-11 ENCOUNTER — Encounter: Payer: Self-pay | Admitting: Internal Medicine

## 2021-10-11 ENCOUNTER — Ambulatory Visit: Payer: Medicare HMO | Admitting: Internal Medicine

## 2021-10-11 ENCOUNTER — Other Ambulatory Visit: Payer: Self-pay

## 2021-10-11 VITALS — BP 150/70 | HR 68 | Temp 97.3°F | Ht 71.0 in | Wt 286.8 lb

## 2021-10-11 DIAGNOSIS — J4520 Mild intermittent asthma, uncomplicated: Secondary | ICD-10-CM

## 2021-10-11 DIAGNOSIS — G4733 Obstructive sleep apnea (adult) (pediatric): Secondary | ICD-10-CM

## 2021-10-11 MED ORDER — ALBUTEROL SULFATE HFA 108 (90 BASE) MCG/ACT IN AERS: 1.0000 | INHALATION_SPRAY | Freq: Four times a day (QID) | RESPIRATORY_TRACT | 11 refills | Status: DC | PRN

## 2021-10-11 NOTE — Patient Instructions (Addendum)
EXCELLENT JOB! A+ Continue CPAP as prescribed  Follow up with Cardiology Albuterol as needed

## 2021-10-11 NOTE — Progress Notes (Signed)
   Name: ZAMIRE WHITEHURST MRN: [id number] DOB: March 24, 1951   CONSULTATION DATE:10/11/2021  REFERRING MD :Dr. 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   CHIEF COMPLAINT:  Follow up ASTHMA Follow up OSA    HISTORY OF PRESENT ILLNESS: Asthma seems to be well-controlled at this time Albuterol as needed only  No exacerbation at this time No evidence of heart failure at this time No evidence or signs of infection at this time No respiratory distress No fevers, chills, nausea, vomiting, diarrhea No evidence of lower extremity edema No evidence hemoptysis  Triggers are smoke,humidity,dogs,grasstakes zyrtec at night    H/o CAD sees Dr Revonda Standard +CAD on imdur and statin On ASA/plavix    Dx of OSA Great compliance Uses daily autoCPAP 9-11 cm h20 at night  Uses nasal pillows Problems with sealing   Review of Systems:  Gen:  Denies  fever, sweats, chills weigh loss  HEENT: Denies blurred vision, double vision, ear pain, eye pain, hearing loss, nose bleeds, sore throat Cardiac:  No dizziness, chest pain or heaviness, chest tightness,edema, No JVD Resp:   No cough, -sputum production, -shortness of breath,-wheezing, -hemoptysis,  Other:  All other systems negative  BP (!) 150/70 (BP Location: Left Arm, Patient Position: Sitting, Cuff Size: Normal)   Pulse 68   Temp (!) 97.3 F (36.3 C) (Oral)   Ht 5\' 11"  (1.803 m)   Wt 286 lb 12.8 oz (130.1 kg)   SpO2 96%   BMI 40.00 kg/m    Physical Examination:   General Appearance: No distress  EYES PERRLA, EOM intact.   NECK Supple, No JVD Pulmonary: normal breath sounds, No wheezing.  CardiovascularNormal S1,S2.  No m/r/g.    ALL OTHER ROS ARE NEGATIVE      ASSESSMENT / PLAN:  70 year old white male presents after 2 and half years for underlying sleep apnea and underlying asthma   OSA is well treated  He  uses and benefits from CPAP blood pressure meds are removed Excellent compliance report Reviewed in detail with patient   ASTHMA MILD intermittent  Albuterol as needed  no need for steroids or ABX   Obesity -recommend significant weight loss -recommend changing diet  Deconditioned state -Recommend increased daily activity and exercise   MEDICATION ADJUSTMENTS/LABS AND TESTS ORDERED: Albuterol as needed Avoid triggers Recommend weight loss Continue CPAP as prescribed  CURRENT MEDICATIONS REVIEWED AT LENGTH WITH PATIENT TODAY   Patient satisfied with Plan of action and management. All questions answered Follow-up 1 year  Total time spent 25 minutes  Emberly Tomasso Patricia Pesa, M.D.  [facility]  Medical Director [place] Director [facility] Cardio-Pulmonary Department

## 2021-11-15 ENCOUNTER — Ambulatory Visit: Payer: Medicare HMO | Admitting: Cardiovascular Disease

## 2021-12-14 ENCOUNTER — Encounter: Payer: Self-pay | Admitting: Cardiovascular Disease

## 2021-12-14 ENCOUNTER — Other Ambulatory Visit: Payer: Self-pay

## 2021-12-14 ENCOUNTER — Ambulatory Visit: Payer: Medicare HMO | Admitting: Cardiovascular Disease

## 2021-12-14 VITALS — BP 130/72 | HR 61 | Ht 71.0 in | Wt 287.0 lb

## 2021-12-14 DIAGNOSIS — I10 Essential (primary) hypertension: Secondary | ICD-10-CM | POA: Diagnosis not present

## 2021-12-14 DIAGNOSIS — I25118 Atherosclerotic heart disease of native coronary artery with other forms of angina pectoris: Secondary | ICD-10-CM

## 2021-12-14 DIAGNOSIS — Z6841 Body Mass Index (BMI) 40.0 and over, adult: Secondary | ICD-10-CM

## 2021-12-14 DIAGNOSIS — E782 Mixed hyperlipidemia: Secondary | ICD-10-CM

## 2021-12-14 DIAGNOSIS — G4733 Obstructive sleep apnea (adult) (pediatric): Secondary | ICD-10-CM

## 2021-12-14 MED ORDER — EZETIMIBE 10 MG PO TABS: 10.0000 mg | ORAL_TABLET | Freq: Every day | ORAL | 3 refills | Status: DC

## 2021-12-14 NOTE — Patient Instructions (Addendum)
Medication Instructions:  Start zetia 10 mg daily  Experiment on and off isosorbide, make sure it is not causing side effects/dizziness  If you need a refill on your cardiac medications before your next appointment, please call your pharmacy.   Lab work: No new labs needed  Testing/Procedures: No new testing needed  Follow-Up: At [facility], you and your health needs are our priority.  As part of our continuing mission to provide you with exceptional heart care, we have created designated Provider Care Teams.  These Care Teams include your primary Cardiologist (physician) and Advanced Practice Providers (APPs -  Physician Assistants and Nurse Practitioners) who all work together to provide you with the care you need, when you need it.  You will need a follow up appointment in 12 months  Providers on your designated Care Team:   Murray Hodgkins, NP Christell Faith, PA-C Cadence Kathlen Mody, [place]  COVID-19 Vaccine Information can be found at: [email or web address] For questions related to vaccine distribution or appointments, please email vaccine@ [email or web address] or call [phone].

## 2021-12-14 NOTE — Progress Notes (Signed)
Date:  12/14/2021   ID:  Bobby Tanner, DOB 1951-09-11, MRN [id number]  Patient Location:  [place]  [id number]   Provider location:   [facility], [place] office  PCP:  Rusty Aus, MD  Cardiologist:  Rockey Situ  Chief Complaint  Patient presents with   2 month follow up     "Doing well." Medications reviewed by the patient verbally.     History of Present Illness:    Bobby Tanner is a 71 y.o. male has a past medical history of Anxiety/stress at work Coronary artery disease  acute anterior ST elevation MI  s/p PCI to the proximal LAD  VF arrest requiring chest compressions 04/10/2016,   residual 50% diagonal disease discharged on 04/12/2016  with follow-up today for his coronary artery disease, STEMI   Has been followed by [facility] the past 2 years Appears to be changing practices back to [facility]  Recently seen by rheumatology for inflammatory arthritis Started on Plaquenil 200 twice daily  Recent cardiac catheterization October 2022, patent stent, 60% RCA disease Discussed in detail  Now retired, trying to stay active In the fall and winter does some hunting/fishing RV travel in summer  Biggest complaint is having dizziness with moving head Not sure if it is vertigo Less an issue with dizziness with standing, more with moving his head Wonders if it is a side effect of the medication, having significant symptoms today  Chronic shortness of breath On CPAP Denies significant leg swelling  Lab work reviewed A1c 6.6 Total cholesterol 119 LDL 55  EKG personally reviewed by myself on todays visit Normal sinus rhythm rate 61 bpm consider old anterior MI  Other past medical history reviewed Previous  Echo showed EF 50-55%, akinesis of the apical myocardium, hypokinesis of the anteroseptal myocardium, LV diastolic function was normal, mild MR, RV systolic function was normal, PASP was normal.   Cardiac catheterization  04/10/2016: 1st Diag lesion, 50% stenosed. Prox LAD lesion, 99% stenosed. Post intervention, there is a 0% residual stenosis. The lesion was not previously treated.The left ventricular systolic function is normal.   1. Severe one-vessel coronary artery disease with heavy thrombotic lesion in the proximal LAD. Otherwise no obstructive disease. 2. Mildly reduced LV systolic function with mild anterior wall hypokinesis. Moderately to severely elevated left ventricular end-diastolic pressure. 3. Successful aspiration thrombectomy and drug-eluting stent placement to the proximal LAD.   Past Medical History:  Diagnosis Date   Arthritis    a. Hands   Asthma    a. had as a child. pollen could aggrevate but does not use inhalers   CAD (coronary artery disease)    a. 2014 c/p w/ reportedly nl cardiac w/u;  b. 03/2016 Ant STEMI c/b VT/VF requiring multiple defibs-->Cath/PCI: LM nl, LAD 99p w/ heavy thrombus (3.5 x 15 Xience Alpine DES), D1 50, LCX min irregs throughout, RCA min irregs throughout, EF 45-50%   Cardiac arrest with ventricular fibrillation ([place])    a. 04/10/2016 VT/VF arrest x 2 in setting of anterior ST elevation MI requiring multiple defibs, chest compressions, amio, epi.   Cataract    a. 03/2016 s/p R cataract extraction and IOL placement.   Diet-controlled diabetes mellitus ([place])    Essential hypertension    GERD (gastroesophageal reflux disease)    Hemorrhoids    History of basal cell carcinoma (BCC) 07/25/2007   left nasal root/glabella area   HOH (hard of hearing)    Hyperlipidemia  Ischemic cardiomyopathy    a. 03/2016 LV Gram: EF 45-50%;  b. 03/2014 Echo: EF 50-55%, apical AK, anteroapical HK, mild MR.   Morbid obesity ([place])    Obstructive sleep apnea    a. Compliant w/ CPAP.   Orthopnea    a. Improved with CPAP.   Skin cancer    a. Basal cell carcinoma of neck s/p resection.   ST elevation myocardial infarction (STEMI) of anterior wall ([place]) 04/10/2016   Wears hearing  aid in both ears    Past Surgical History:  Procedure Laterality Date   CARDIAC CATHETERIZATION N/A 04/10/2016   Procedure: Left Heart Cath and Coronary Angiography;  Surgeon: Wellington Hampshire, MD;  Location: [place] [facility];  Service: Cardiovascular;  Laterality: N/A;   CARDIAC CATHETERIZATION N/A 04/10/2016   Procedure: Coronary Stent Intervention;  Surgeon: Wellington Hampshire, MD;  Location: [place] [facility];  Service: Cardiovascular;  Laterality: N/A;   CATARACT EXTRACTION W/PHACO Right 04/03/2016   Procedure: CATARACT EXTRACTION PHACO AND INTRAOCULAR LENS PLACEMENT (IOC);  Surgeon: Estill Cotta, MD;  Location: [facility];  Service: Ophthalmology;  Laterality: Right;  [place] 01:13 AP% 25.8 CDE 34.18 fluid pack lot #[id number]   CATARACT EXTRACTION W/PHACO Left 06/18/2018   Procedure: CATARACT EXTRACTION PHACO AND INTRAOCULAR LENS PLACEMENT ([place]) LEFT DIABETES IVA TOPICAL;  Surgeon: Leandrew Koyanagi, MD;  Location: [place];  Service: Ophthalmology;  Laterality: Left;  Diabetic - diet controlled   COLONOSCOPY  2014   COLONOSCOPY WITH PROPOFOL N/A 09/11/2018   Procedure: COLONOSCOPY WITH PROPOFOL;  Surgeon: Toledo, Benay Pike, MD;  Location: [facility];  Service: Gastroenterology;  Laterality: N/A;   CORONARY ANGIOPLASTY     EYE SURGERY     INTRAVASCULAR PRESSURE WIRE/FFR STUDY N/A 09/02/2021   Procedure: INTRAVASCULAR PRESSURE WIRE/FFR STUDY;  Surgeon: Wellington Hampshire, MD;  Location: [place] [facility];  Service: Cardiovascular;  Laterality: N/A;   LEFT HEART CATH AND CORONARY ANGIOGRAPHY Left 09/02/2021   Procedure: LEFT HEART CATH AND CORONARY ANGIOGRAPHY;  Surgeon: Wellington Hampshire, MD;  Location: [place] [facility];  Service: Cardiovascular;  Laterality: Left;   skin cancers     head     Current Meds  Medication Sig   albuterol (VENTOLIN HFA) 108 (90 Base) MCG/ACT inhaler Inhale 1-2 puffs into the lungs every 6 (six) hours as needed for wheezing or shortness  of breath.   aspirin EC 81 MG tablet Take 81 mg by mouth in the morning.   azelastine (ASTELIN) 0.1 % nasal spray Place 2 sprays into the nose at bedtime as needed for allergies.   Carboxymethylcellulose Sodium (THERATEARS) 0.25 % SOLN Place 1-2 drops into both eyes 3 (three) times daily as needed (dry/irritated eyes).   Cyanocobalamin (VITAMIN B-12) 5000 MCG SUBL Take 5,000 mcg by mouth in the morning.   furosemide (LASIX) 20 MG tablet TAKE 1 TABLET BY MOUTH DAILY AS NEEDED   gabapentin (NEURONTIN) 300 MG capsule Take 300 mg by mouth 3 (three) times daily.   hydroxychloroquine (PLAQUENIL) 200 MG tablet Take 200 mg by mouth 2 (two) times daily.   isosorbide mononitrate (IMDUR) 30 MG 24 hr tablet Take 30 mg by mouth in the morning.   lansoprazole (PREVACID) 30 MG capsule Take 30 mg by mouth in the morning.   metFORMIN (GLUCOPHAGE) 500 MG tablet Take 500 mg by mouth in the morning and at bedtime.   nitroGLYCERIN (NITROSTAT) 0.4 MG SL tablet Place 1 tablet (0.4 mg total) under the tongue every 5 (  five) minutes x 3 doses as needed for chest pain.   rosuvastatin (CRESTOR) 20 MG tablet Take 1 tablet (20 mg total) by mouth daily.   zolpidem (AMBIEN) 10 MG tablet Take 10 mg by mouth at bedtime.     Allergies:   Pollen extract and Dog epithelium allergy skin test   Social History   Tobacco Use   Smoking status: Never   Smokeless tobacco: Never   Tobacco comments:    no smokers in  home  Vaping Use   Vaping Use: Never used  Substance Use Topics   Alcohol use: Yes    Alcohol/week: 0.0 standard drinks    Comment: occasional drink every one or two weeks   Drug use: No     Family Hx: The patient's family history includes Alzheimer's disease in his mother; CAD in his father; Hypertension in his brother, father, and paternal grandfather.  ROS:   Please see the history of present illness.    Review of Systems  Constitutional: Negative.   Respiratory: Negative.    Cardiovascular: Negative.    Gastrointestinal: Negative.   Musculoskeletal: Negative.   Neurological: Negative.   Psychiatric/Behavioral: Negative.    All other systems reviewed and are negative.    Labs/Other Tests and Data Reviewed:    Recent Labs: No results found for requested labs within last 8760 hours.   Recent Lipid Panel Lab Results  Component Value Date/Time   CHOL 189 04/11/2016 06:48 AM   TRIG 100 04/11/2016 06:48 AM   HDL 37 (L) 04/11/2016 06:48 AM   CHOLHDL 5.1 04/11/2016 06:48 AM   LDLCALC 132 (H) 04/11/2016 06:48 AM    Wt Readings from Last 3 Encounters:  12/14/21 287 lb (130.2 kg)  10/11/21 286 lb 12.8 oz (130.1 kg)  09/19/21 283 lb 4 oz (128.5 kg)     Exam:    BP 130/72 (BP Location: Left Arm, Patient Position: Sitting, Cuff Size: Large)    Pulse 61    Ht 5\' 11"  (1.803 m)    Wt 287 lb (130.2 kg)    SpO2 98%    BMI 40.03 kg/m  Constitutional:  oriented to person, place, and time. No distress.  Obese HENT:  Head: Grossly normal Eyes:  no discharge. No scleral icterus.  Neck: No JVD, no carotid bruits  Cardiovascular: Regular rate and rhythm, no murmurs appreciated Pulmonary/Chest: Clear to auscultation bilaterally, no wheezes or rails Abdominal: Soft.  no distension.  no tenderness.  Musculoskeletal: Normal range of motion Neurological:  normal muscle tone. Coordination normal. No atrophy Skin: Skin warm and dry Psychiatric: normal affect, pleasant   ASSESSMENT & PLAN:     Coronary artery disease of native artery of native heart with stable angina pectoris (HCC) Currently with no symptoms of angina. No further workup at this time. Continue current medication regimen. Will add Zetia 60% RCA blockage on recent catheterization discussed, patent LAD stent  Shortness of breath Recommend regular exercise program  Dizziness Not orthostatic, possible vertigo Recommend if symptoms persist, may need to see ENT We will move isosorbide to the evening Could even hold isosorbide  to see if symptoms get better  Essential hypertension Plan as above, could monitor blood pressure without isosorbide to see if this helps dizziness symptoms If symptoms better without isosorbide but blood pressure elevated may need alternate medication.  We will try to avoid calcium channel blockers and potential for leg swelling, would try ARB  Mixed hyperlipidemia Stressed importance of staying on his statin, will add  Zetia  Chronic fatigue Recommended walking program, lifestyle modification    Total encounter time more than 35 minutes  Greater than 50% was spent in counseling and coordination of care with the patient   Signed, Ida Rogue, MD  12/14/2021 8:40 AM    [place] [address], [place], [place] [address]

## 2021-12-26 ENCOUNTER — Telehealth: Payer: Self-pay | Admitting: Cardiovascular Disease

## 2021-12-26 NOTE — Telephone Encounter (Signed)
Pharmacy updated as requested.

## 2021-12-26 NOTE — Telephone Encounter (Signed)
Patient wants to update pharmacy [facility] [address]

## 2022-02-03 ENCOUNTER — Other Ambulatory Visit: Payer: Self-pay

## 2022-02-03 ENCOUNTER — Encounter: Payer: Self-pay | Admitting: Cardiovascular Disease

## 2022-02-03 ENCOUNTER — Encounter: Payer: Self-pay | Admitting: Internal Medicine

## 2022-02-03 NOTE — Telephone Encounter (Signed)
New pharmacy switched. Nothing further needed.  ?

## 2022-07-21 ENCOUNTER — Other Ambulatory Visit: Payer: Self-pay | Admitting: Physician Assistant

## 2022-08-28 ENCOUNTER — Ambulatory Visit
Admission: RE | Admit: 2022-08-28 | Discharge: 2022-08-28 | Disposition: A | Discharge status: Home / Self Care | Payer: Medicare HMO | Source: Ambulatory Visit | Attending: Family Medicine | Admitting: Family Medicine

## 2022-08-28 ENCOUNTER — Other Ambulatory Visit: Payer: Self-pay | Admitting: Family Medicine

## 2022-08-28 DIAGNOSIS — R1011 Right upper quadrant pain: Secondary | ICD-10-CM | POA: Diagnosis present

## 2022-08-28 DIAGNOSIS — R197 Diarrhea, unspecified: Secondary | ICD-10-CM

## 2022-08-28 DIAGNOSIS — R141 Gas pain: Secondary | ICD-10-CM

## 2022-08-28 DIAGNOSIS — R140 Abdominal distension (gaseous): Secondary | ICD-10-CM

## 2022-08-28 DIAGNOSIS — R109 Unspecified abdominal pain: Secondary | ICD-10-CM

## 2022-08-28 MED ORDER — IOHEXOL 300 MG/ML  SOLN
100.0000 mL | Freq: Once | INTRAMUSCULAR | Status: AC | PRN
  Administered 2022-08-28: 100 mL via INTRAVENOUS

## 2022-09-26 ENCOUNTER — Other Ambulatory Visit: Payer: Self-pay | Admitting: Cardiovascular Disease

## 2022-11-13 ENCOUNTER — Telehealth: Payer: Self-pay | Admitting: Cardiovascular Disease

## 2022-11-13 MED ORDER — NITROGLYCERIN 0.4 MG SL SUBL: 0.4000 mg | SUBLINGUAL_TABLET | SUBLINGUAL | 1 refills | Status: DC | PRN

## 2022-11-13 NOTE — Telephone Encounter (Signed)
*  STAT* If patient is at the pharmacy, call can be transferred to refill team.   1. Which medications need to be refilled? (please list name of each medication and dose if known)   nitroGLYCERIN (NITROSTAT) 0.4 MG SL tablet   2. Which pharmacy/location (including street and city if local pharmacy) is medication to be sent to?  [id number] [place]  3. Do they need a 30 day or 90 day supply? 30 day  Patient stated he is completely out of this medication.

## 2022-11-24 ENCOUNTER — Telehealth: Payer: Self-pay | Admitting: Cardiovascular Disease

## 2022-11-24 NOTE — Telephone Encounter (Signed)
Pt is requesting a switch from Dr. Rockey Situ to Dr. Fletcher Anon, is this okay with you all?

## 2022-11-24 NOTE — Telephone Encounter (Signed)
Fine with me

## 2022-11-30 NOTE — Telephone Encounter (Signed)
Pt has been updated and scheduled for 2/20.

## 2022-12-22 ENCOUNTER — Other Ambulatory Visit: Payer: Self-pay | Admitting: Cardiovascular Disease

## 2022-12-26 ENCOUNTER — Ambulatory Visit: Payer: Medicare HMO | Admitting: Cardiovascular Disease

## 2023-01-10 ENCOUNTER — Other Ambulatory Visit: Payer: Self-pay | Admitting: Cardiovascular Disease

## 2023-01-16 ENCOUNTER — Encounter: Payer: Self-pay | Admitting: Cardiovascular Disease

## 2023-01-16 ENCOUNTER — Ambulatory Visit: Payer: Medicare HMO | Attending: Cardiovascular Disease | Admitting: Cardiovascular Disease

## 2023-01-16 VITALS — BP 160/82 | HR 62 | Ht 71.0 in | Wt 283.5 lb

## 2023-01-16 DIAGNOSIS — R011 Cardiac murmur, unspecified: Secondary | ICD-10-CM

## 2023-01-16 DIAGNOSIS — I10 Essential (primary) hypertension: Secondary | ICD-10-CM

## 2023-01-16 DIAGNOSIS — I25118 Atherosclerotic heart disease of native coronary artery with other forms of angina pectoris: Secondary | ICD-10-CM

## 2023-01-16 DIAGNOSIS — E785 Hyperlipidemia, unspecified: Secondary | ICD-10-CM | POA: Diagnosis not present

## 2023-01-16 NOTE — Progress Notes (Unsigned)
Cardiology Office Note   Date:  01/17/2023   ID:  [name], DOB March 01, 1951, MRN [id number]  PCP:  Rusty Aus, MD  Cardiologist:   Kathlyn Sacramento, MD   Chief Complaint  Patient presents with   Other    12 Month f/u c/o mild chest pain believes stress induced, dizziness, discuss meds and weight loss medications. Meds reviewed verbally with pt.      History of Present Illness: Bobby Tanner is a 72 y.o. male who presents for a follow-up visit regarding coronary artery disease.  He has known history of essential hypertension, inflammatory arthritis, sleep apnea, diet-controlled diabetes, GERD, hyperlipidemia and obesity. He had anterior ST elevation myocardial infarction in May 0000000 complicated by ventricular fibrillation which required multiple defibrillations.  Emergent cardiac catheterization showed 99% thrombotic stenosis in the proximal LAD.  He was treated with angioplasty and drug-eluting stent placement. Repeat cardiac catheterization in October 2022 showed patent LAD stent with no significant restenosis, stable moderate stenosis in the first diagonal, moderate proximal stenosis in the right coronary artery which was not significant by fractional flow reserve.  EF was normal with moderately elevated left ventricular end-diastolic pressure. He had issues with blood pressure thoughts related to dizziness.  Overall he has been doing reasonably well and denies chest pain or worsening dyspnea.  His lifestyle is sedentary overall.  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   Past Medical History:  Diagnosis Date   Arthritis    a. Hands   Asthma    a. had as a child. pollen could aggrevate but does not use inhalers   CAD (coronary artery disease)    a. 2014 c/p w/ reportedly nl cardiac w/u;  b. 03/2016 Ant STEMI c/b VT/VF requiring multiple defibs-->Cath/PCI: LM nl, LAD 99p w/ heavy thrombus (3.5 x 15 Xience Alpine DES), D1 50, LCX min irregs throughout, RCA min irregs  throughout, EF 45-50%   Cardiac arrest with ventricular fibrillation ([place])    a. 04/10/2016 VT/VF arrest x 2 in setting of anterior ST elevation MI requiring multiple defibs, chest compressions, amio, epi.   Cataract    a. 03/2016 s/p R cataract extraction and IOL placement.   Diet-controlled diabetes mellitus ([place])    Essential hypertension    GERD (gastroesophageal reflux disease)    Hemorrhoids    History of basal cell carcinoma (BCC) 07/25/2007   left nasal root/glabella area   HOH (hard of hearing)    Hyperlipidemia    Ischemic cardiomyopathy    a. 03/2016 LV Gram: EF 45-50%;  b. 03/2014 Echo: EF 50-55%, apical AK, anteroapical HK, mild MR.   Morbid obesity ([place])    Obstructive sleep apnea    a. Compliant w/ CPAP.   Orthopnea    a. Improved with CPAP.   Skin cancer    a. Basal cell carcinoma of neck s/p resection.   ST elevation myocardial infarction (STEMI) of anterior wall ([name]) 04/10/2016   Wears hearing aid in both ears     Past Surgical History:  Procedure Laterality Date   CARDIAC CATHETERIZATION N/A 04/10/2016   Procedure: Left Heart Cath and Coronary Angiography;  Surgeon: Wellington Hampshire, MD;  Location: [place] [facility];  Service: Cardiovascular;  Laterality: N/A;   CARDIAC CATHETERIZATION N/A 04/10/2016   Procedure: Coronary Stent Intervention;  Surgeon: Wellington Hampshire, MD;  Location: [place] [facility];  Service: Cardiovascular;  Laterality: N/A;   CATARACT EXTRACTION W/PHACO Right 04/03/2016   Procedure: CATARACT  EXTRACTION PHACO AND INTRAOCULAR LENS PLACEMENT (IOC);  Surgeon: Estill Cotta, MD;  Location: [facility];  Service: Ophthalmology;  Laterality: Right;  [place] 01:13 AP% 25.8 CDE 34.18 fluid pack lot [id number]   CATARACT EXTRACTION W/PHACO Left 06/18/2018   Procedure: CATARACT EXTRACTION PHACO AND INTRAOCULAR LENS PLACEMENT ([place]) LEFT DIABETES IVA TOPICAL;  Surgeon: Leandrew Koyanagi, MD;  Location: [place];  Service: Ophthalmology;   Laterality: Left;  Diabetic - diet controlled   COLONOSCOPY  2014   COLONOSCOPY WITH PROPOFOL N/A 09/11/2018   Procedure: COLONOSCOPY WITH PROPOFOL;  Surgeon: Toledo, Benay Pike, MD;  Location: [facility];  Service: Gastroenterology;  Laterality: N/A;   CORONARY ANGIOPLASTY     EYE SURGERY     INTRAVASCULAR PRESSURE WIRE/FFR STUDY N/A 09/02/2021   Procedure: INTRAVASCULAR PRESSURE WIRE/FFR STUDY;  Surgeon: Wellington Hampshire, MD;  Location: [place] [facility];  Service: Cardiovascular;  Laterality: N/A;   LEFT HEART CATH AND CORONARY ANGIOGRAPHY Left 09/02/2021   Procedure: LEFT HEART CATH AND CORONARY ANGIOGRAPHY;  Surgeon: Wellington Hampshire, MD;  Location: [place] [facility];  Service: Cardiovascular;  Laterality: Left;   skin cancers     head     Current Outpatient Medications  Medication Sig Dispense Refill   aspirin EC 81 MG tablet Take 81 mg by mouth in the morning.     azelastine (ASTELIN) 0.1 % nasal spray Place 2 sprays into the nose at bedtime as needed for allergies.     Carboxymethylcellulose Sodium (THERATEARS) 0.25 % SOLN Place 1-2 drops into both eyes 3 (three) times daily as needed (dry/irritated eyes).     Cyanocobalamin (VITAMIN B-12) 5000 MCG SUBL Take 5,000 mcg by mouth in the morning.     ezetimibe (ZETIA) 10 MG tablet TAKE 1 TABLET BY MOUTH EVERY DAY 90 tablet 3   furosemide (LASIX) 20 MG tablet TAKE 1 TABLET BY MOUTH DAILY AS NEEDED 90 tablet 0   gabapentin (NEURONTIN) 300 MG capsule Take 300 mg by mouth 3 (three) times daily.     glipiZIDE (GLUCOTROL) 5 MG tablet Take 5 mg by mouth daily before breakfast.     isosorbide mononitrate (IMDUR) 30 MG 24 hr tablet Take 30 mg by mouth in the morning.     lansoprazole (PREVACID) 30 MG capsule Take 30 mg by mouth in the morning.     leflunomide (ARAVA) 20 MG tablet Take 20 mg by mouth daily.     meloxicam (MOBIC) 15 MG tablet Take 15 mg by mouth as needed.     montelukast (SINGULAIR) 10 MG tablet Take 10 mg by mouth at  bedtime.     Multiple Vitamin (MULTIVITAMIN) capsule Take 1 capsule by mouth daily.     nitroGLYCERIN (NITROSTAT) 0.4 MG SL tablet Place 1 tablet (0.4 mg total) under the tongue every 5 (five) minutes x 3 doses as needed for chest pain. 25 tablet 1   Omega-3 Fatty Acids (FISH OIL) 1000 MG CAPS Take by mouth daily in the afternoon.     rosuvastatin (CRESTOR) 20 MG tablet TAKE 1 TABLET BY MOUTH EVERY DAY 30 tablet 0   zolpidem (AMBIEN) 10 MG tablet Take 10 mg by mouth at bedtime.     No current facility-administered medications for this visit.    Allergies:   Pollen extract and Dog epithelium allergy skin test    Social History:  The patient  reports that he has never smoked. He has never used smokeless tobacco. He reports current alcohol use. He reports that  he does not use drugs.   Family History:  The patient's family history includes Alzheimer's disease in his mother; CAD in his father; Hypertension in his brother, father, and paternal grandfather.    ROS:  Please see the history of present illness.   Otherwise, review of systems are positive for none.   All other systems are reviewed and negative.    PHYSICAL EXAM: VS:  BP (!) 160/82 (BP Location: Left Arm, Patient Position: Sitting, Cuff Size: Large)   Pulse 62   Ht 5' 11"$  (1.803 m)   Wt 283 lb 8 oz (128.6 kg)   SpO2 99%   BMI 39.54 kg/m  , BMI Body mass index is 39.54 kg/m. GEN: Well nourished, well developed, in no acute distress  HEENT: normal  Neck: no JVD, carotid bruits, or masses Cardiac: RRR; no  rubs, or gallops, 2 out of 6 systolic murmur in the aortic area.  Mild to moderate bilateral leg edema Respiratory:  clear to auscultation bilaterally, normal work of breathing GI: soft, nontender, nondistended, + BS MS: no deformity or atrophy  Skin: warm and dry, no rash Neuro:  Strength and sensation are intact Psych: euthymic mood, full affect   EKG:  EKG is ordered today. The ekg ordered today demonstrates normal  sinus rhythm with left anterior fascicular block.   Recent Labs: No results found for requested labs within last 365 days.    Lipid Panel    Component Value Date/Time   CHOL 189 04/11/2016 0648   TRIG 100 04/11/2016 0648   HDL 37 (L) 04/11/2016 0648   CHOLHDL 5.1 04/11/2016 0648   VLDL 20 04/11/2016 0648   LDLCALC 132 (H) 04/11/2016 0648      Wt Readings from Last 3 Encounters:  01/16/23 283 lb 8 oz (128.6 kg)  12/14/21 287 lb (130.2 kg)  10/11/21 286 lb 12.8 oz (130.1 kg)           No data to display            ASSESSMENT AND PLAN:  1.  Coronary artery disease involving native coronary arteries with other forms of angina: He had chest pain in the past that responded to isosorbide and symptoms seem to be stable overall.  He had questions about his most recent cardiac catheterization that showed moderate proximal right coronary artery stenosis.  I discussed the indication for revascularization.  Given that he has no refractory angina at the present time, I recommend continuing medical therapy and improved lifestyle.  2.  History of ischemic cardiomyopathy: Most recent ejection fraction was normal.  Will recheck with echocardiogram.  3.  Essential hypertension: Blood pressure is mildly elevated.  Reports closer to normal blood pressure at home and he is hesitant to start an antihypertensive medication given issues with dizziness.  4.  Hyperlipidemia: He is on rosuvastatin 20 mg daily and ezetimibe.  I reviewed most recent lipid profile which showed an LDL of 32.  5.  Cardiac murmur: Suggestive of aortic sclerosis or mild stenosis.  I requested an echocardiogram.  6.  Type 2 diabetes: Followed by his primary care physician.  The patient asked about medications to help him lose weight.  I suggested discussing with his primary care physician regarding Ozempic or similar medications.    Disposition:   FU in 6 months.  Signed,  Kathlyn Sacramento, MD  01/17/2023 12:56 PM     [place]

## 2023-01-16 NOTE — Patient Instructions (Signed)
Medication Instructions:  No changes *If you need a refill on your cardiac medications before your next appointment, please call your pharmacy*   Lab Work: None ordered If you have labs (blood work) drawn today and your tests are completely normal, you will receive your results only by: [place] (if you have MyChart) OR A paper copy in the mail If you have any lab test that is abnormal or we need to change your treatment, we will call you to review the results.   Testing/Procedures: Your physician has requested that you have an echocardiogram. Echocardiography is a painless test that uses sound waves to create images of your heart. It provides your doctor with information about the size and shape of your heart and how well your heart's chambers and valves are working.   You may receive an ultrasound enhancing agent through an IV if needed to better visualize your heart during the echo. This procedure takes approximately one hour.  There are no restrictions for this procedure.  This will take place at [facility] ([id number], [place]    Follow-Up: At [facility], you and your health needs are our priority.  As part of our continuing mission to provide you with exceptional heart care, we have created designated Provider Care Teams.  These Care Teams include your primary Cardiologist (physician) and Advanced Practice Providers (APPs -  Physician Assistants and Nurse Practitioners) who all work together to provide you with the care you need, when you need it.  We recommend signing up for the patient portal called "MyChart".  Sign up information is provided on this After Visit Summary.  MyChart is used to connect with patients for Virtual Visits (Telemedicine).  Patients are able to view lab/test results, encounter notes, upcoming appointments, etc.  Non-urgent messages can be sent to your provider as well.   To learn more about what you can  do with MyChart, go to [email or web address].    Your next appointment:   6 month(s)  Provider:   You may see Dr. Fletcher Anon or one of the following Advanced Practice Providers on your designated Care Team:   Murray Hodgkins, NP Christell Faith, PA-C Cadence Kathlen Mody, PA-C Gerrie Nordmann, NP

## 2023-01-23 ENCOUNTER — Other Ambulatory Visit: Payer: Self-pay | Admitting: Cardiovascular Disease

## 2023-01-25 ENCOUNTER — Telehealth: Payer: Self-pay | Admitting: Internal Medicine

## 2023-01-25 NOTE — Telephone Encounter (Signed)
Patient states needs order for CPAP supplies. Patient uses [facility] for CPAP machine. Patient scheduled 03/15/2023 with Dr. Mortimer Fries. Patient phone number Is [phone].

## 2023-01-25 NOTE — Telephone Encounter (Signed)
Patient last seen 09/2021. He is aware that an appt is needed prior to supplies being order. Pending appt 03/15/2023. He voiced his understanding and had no further questions.  Nothing further needed.

## 2023-03-06 ENCOUNTER — Other Ambulatory Visit: Payer: Medicare HMO

## 2023-03-15 ENCOUNTER — Ambulatory Visit: Payer: Medicare HMO | Admitting: Internal Medicine

## 2023-03-26 ENCOUNTER — Ambulatory Visit: Payer: Medicare HMO | Attending: Cardiovascular Disease

## 2023-03-26 ENCOUNTER — Other Ambulatory Visit: Payer: Self-pay | Admitting: Cardiovascular Disease

## 2023-03-26 DIAGNOSIS — R011 Cardiac murmur, unspecified: Secondary | ICD-10-CM | POA: Diagnosis not present

## 2023-03-26 LAB — ECHOCARDIOGRAM COMPLETE
AR max vel: 1.71 cm2
AV Area VTI: 1.8 cm2
AV Area mean vel: 1.73 cm2
AV Mean grad: 9.5 mmHg
AV Peak grad: 18.1 mmHg
Ao pk vel: 2.13 m/s
Area-P 1/2: 2.99 cm2
S' Lateral: 2.8 cm

## 2023-03-26 NOTE — Telephone Encounter (Signed)
Last visit 01/16/23--6 month(s) f/u Next visit not scheduled

## 2023-03-28 ENCOUNTER — Encounter: Payer: Self-pay | Admitting: Cardiovascular Disease

## 2023-04-11 ENCOUNTER — Other Ambulatory Visit: Payer: Self-pay | Admitting: Cardiovascular Disease

## 2023-04-11 NOTE — Telephone Encounter (Signed)
last visit 01/17/2023--Disposition:   FU in 6 months. next visit none

## 2023-05-26 ENCOUNTER — Other Ambulatory Visit: Payer: Self-pay | Admitting: Cardiovascular Disease

## 2023-07-21 ENCOUNTER — Other Ambulatory Visit: Payer: Self-pay | Admitting: Cardiovascular Disease

## 2023-07-23 NOTE — Telephone Encounter (Signed)
Please contact pt for future appointment. Pt due for 6 month f/u. 

## 2023-08-22 ENCOUNTER — Other Ambulatory Visit: Payer: Self-pay | Admitting: Cardiovascular Disease

## 2023-08-22 NOTE — Telephone Encounter (Signed)
last visit 01/16/23 with plan to f/u in 6 months, please schedule.

## 2023-08-23 ENCOUNTER — Other Ambulatory Visit: Payer: Self-pay | Admitting: Cardiovascular Disease

## 2023-08-23 ENCOUNTER — Encounter: Payer: Self-pay | Admitting: Emergency Medicine

## 2023-08-23 ENCOUNTER — Emergency Department: Payer: Medicare HMO

## 2023-08-23 ENCOUNTER — Emergency Department
Admission: EM | Admit: 2023-08-23 | Discharge: 2023-08-23 | Disposition: A | Discharge status: Home / Self Care | Payer: Medicare HMO | Attending: Emergency Medicine | Admitting: Emergency Medicine

## 2023-08-23 DIAGNOSIS — E119 Type 2 diabetes mellitus without complications: Secondary | ICD-10-CM | POA: Diagnosis not present

## 2023-08-23 DIAGNOSIS — R1013 Epigastric pain: Secondary | ICD-10-CM | POA: Insufficient documentation

## 2023-08-23 DIAGNOSIS — I2510 Atherosclerotic heart disease of native coronary artery without angina pectoris: Secondary | ICD-10-CM | POA: Diagnosis not present

## 2023-08-23 DIAGNOSIS — R112 Nausea with vomiting, unspecified: Secondary | ICD-10-CM | POA: Insufficient documentation

## 2023-08-23 DIAGNOSIS — I10 Essential (primary) hypertension: Secondary | ICD-10-CM | POA: Insufficient documentation

## 2023-08-23 DIAGNOSIS — R1011 Right upper quadrant pain: Secondary | ICD-10-CM | POA: Insufficient documentation

## 2023-08-23 DIAGNOSIS — R1010 Upper abdominal pain, unspecified: Secondary | ICD-10-CM

## 2023-08-23 LAB — URINALYSIS, W/ REFLEX TO CULTURE (INFECTION SUSPECTED)
Bacteria, UA: NONE SEEN
Bilirubin Urine: NEGATIVE
Glucose, UA: 150 mg/dL — AB
Hgb urine dipstick: NEGATIVE
Ketones, ur: NEGATIVE mg/dL
Nitrite: NEGATIVE
Protein, ur: NEGATIVE mg/dL
Specific Gravity, Urine: >1.046 — ABNORMAL HIGH (ref 1.005–1.030)
pH: 6 (ref 5.0–8.0)

## 2023-08-23 LAB — CBC WITH DIFFERENTIAL/PLATELET
Abs Immature Granulocytes: 0.02 10*3/uL (ref 0.00–0.07)
Basophils Absolute: 0.1 10*3/uL (ref 0.0–0.1)
Basophils Relative: 1 %
Eosinophils Absolute: 0.4 10*3/uL (ref 0.0–0.5)
Eosinophils Relative: 5 %
HCT: 43.2 % (ref 39.0–52.0)
Hemoglobin: 14 g/dL (ref 13.0–17.0)
Immature Granulocytes: 0 %
Lymphocytes Relative: 31 %
Lymphs Abs: 2.4 10*3/uL (ref 0.7–4.0)
MCH: 29.4 pg (ref 26.0–34.0)
MCHC: 32.4 g/dL (ref 30.0–36.0)
MCV: 90.8 fL (ref 80.0–100.0)
Monocytes Absolute: 0.2 10*3/uL (ref 0.1–1.0)
Monocytes Relative: 3 %
Neutro Abs: 4.6 10*3/uL (ref 1.7–7.7)
Neutrophils Relative %: 60 %
Platelets: 185 10*3/uL (ref 150–400)
RBC: 4.76 MIL/uL (ref 4.22–5.81)
RDW: 16.1 % — ABNORMAL HIGH (ref 11.5–15.5)
WBC: 7.7 10*3/uL (ref 4.0–10.5)
nRBC: 0 % (ref 0.0–0.2)

## 2023-08-23 LAB — COMPREHENSIVE METABOLIC PANEL
ALT: 39 U/L (ref 0–44)
AST: 34 U/L (ref 15–41)
Albumin: 4 g/dL (ref 3.5–5.0)
Alkaline Phosphatase: 71 U/L (ref 38–126)
Anion gap: 10 (ref 5–15)
BUN: 15 mg/dL (ref 8–23)
CO2: 25 mmol/L (ref 22–32)
Calcium: 9.1 mg/dL (ref 8.9–10.3)
Chloride: 107 mmol/L (ref 98–111)
Creatinine, Ser: 0.8 mg/dL (ref 0.61–1.24)
GFR, Estimated: >60 mL/min (ref >60)
Glucose, Bld: 166 mg/dL — ABNORMAL HIGH (ref 70–99)
Potassium: 4.1 mmol/L (ref 3.5–5.1)
Sodium: 142 mmol/L (ref 135–145)
Total Bilirubin: 1.1 mg/dL (ref 0.3–1.2)
Total Protein: 7.3 g/dL (ref 6.5–8.1)

## 2023-08-23 LAB — LIPASE, BLOOD: Lipase: 34 U/L (ref 11–51)

## 2023-08-23 LAB — TROPONIN I (HIGH SENSITIVITY): Troponin I (High Sensitivity): 5 ng/L (ref <18)

## 2023-08-23 MED ORDER — KETOROLAC TROMETHAMINE 15 MG/ML IJ SOLN
15.0000 mg | Freq: Once | INTRAMUSCULAR | Status: AC
  Administered 2023-08-23: 15 mg via INTRAVENOUS
  Filled 2023-08-23: qty 1

## 2023-08-23 MED ORDER — ONDANSETRON HCL 4 MG/2ML IJ SOLN
4.0000 mg | Freq: Once | INTRAMUSCULAR | Status: AC
  Administered 2023-08-23: 4 mg via INTRAVENOUS
  Filled 2023-08-23: qty 2

## 2023-08-23 MED ORDER — METOCLOPRAMIDE HCL 10 MG PO TABS: 10.0000 mg | ORAL_TABLET | Freq: Four times a day (QID) | ORAL | 0 refills | Status: DC | PRN

## 2023-08-23 MED ORDER — OXYCODONE-ACETAMINOPHEN 5-325 MG PO TABS
1.0000 | ORAL_TABLET | Freq: Four times a day (QID) | ORAL | 0 refills | Status: DC | PRN
Start: 2023-08-23 — End: 2023-12-13

## 2023-08-23 MED ORDER — PANTOPRAZOLE SODIUM 40 MG IV SOLR
40.0000 mg | Freq: Once | INTRAVENOUS | Status: AC
  Administered 2023-08-23: 40 mg via INTRAVENOUS
  Filled 2023-08-23: qty 10

## 2023-08-23 MED ORDER — IOHEXOL 300 MG/ML  SOLN
100.0000 mL | Freq: Once | INTRAMUSCULAR | Status: AC | PRN
  Administered 2023-08-23: 100 mL via INTRAVENOUS

## 2023-08-23 MED ORDER — SODIUM CHLORIDE 0.9 % IV BOLUS
1000.0000 mL | Freq: Once | INTRAVENOUS | Status: AC
  Administered 2023-08-23: 1000 mL via INTRAVENOUS

## 2023-08-23 MED ORDER — MORPHINE SULFATE (PF) 4 MG/ML IV SOLN
4.0000 mg | Freq: Once | INTRAVENOUS | Status: AC
  Administered 2023-08-23: 4 mg via INTRAVENOUS
  Filled 2023-08-23: qty 1

## 2023-08-23 NOTE — Telephone Encounter (Signed)
Please contact pt for future appointment. Pt due for 6 month f/u. 

## 2023-08-23 NOTE — ED Provider Notes (Signed)
[facility] Provider Note    Event Date/Time   First MD Initiated Contact with Patient 08/23/23 0600     (approximate)   History   Abdominal Pain   HPI  Bobby Tanner is a 72 y.o. male   Past medical history of hypertension, diabetes, hyperlipidemia, CAD who presents to the emergency department with epigastric/right upper quadrant pain starting last night while in bed ready to go to sleep around 11 PM.  Then became nauseated and started vomiting multiple times.  He denies chest pain, cough, shortness of breath, fever or chills.  Bowel movements and urination have been normal.  External Medical Documents Reviewed: CT scan in 2023 showed no gallbladder stones but did show a stable hepatic mass      Physical Exam   Triage Vital Signs: ED Triage Vitals  Encounter Vitals Group     BP 08/23/23 0600 (!) 193/74     Systolic BP Percentile --      Diastolic BP Percentile --      Pulse Rate 08/23/23 0600 64     Resp 08/23/23 0600 18     Temp 08/23/23 0600 98 F (36.7 C)     Temp Source 08/23/23 0600 Oral     SpO2 08/23/23 0600 93 %     Weight 08/23/23 0545 285 lb (129.3 kg)     Height 08/23/23 0545 5\' 11"  (1.803 m)     Head Circumference --      Peak Flow --      Pain Score 08/23/23 0545 9     Pain Loc --      Pain Education --      Exclude from Growth Chart --     Most recent vital signs: Vitals:   08/23/23 0600  BP: (!) 193/74  Pulse: 64  Resp: 18  Temp: 98 F (36.7 C)  SpO2: 93%    General: Awake, no distress.  CV:  Good peripheral perfusion.  Resp:  Normal effort.  Abd:  No distention.  Other:  Awake alert hypertensive, afebrile, with epigastric and right upper quadrant tenderness to palpation.  No rigidity or guarding.   ED Results / Procedures / Treatments   Labs (all labs ordered are listed, but only abnormal results are displayed) Labs Reviewed  COMPREHENSIVE METABOLIC PANEL  LIPASE, BLOOD  CBC WITH  DIFFERENTIAL/PLATELET  URINALYSIS, W/ REFLEX TO CULTURE (INFECTION SUSPECTED)  TROPONIN I (HIGH SENSITIVITY)     EKG  ED ECG REPORT I, Pilar Jarvis, the attending physician, personally viewed and interpreted this ECG.   Date: 08/23/2023  EKG Time: 0646  Rate: 67  Rhythm: normal sinus rhythm  Axis: nl  Intervals: none  ST&T Change: no stemi    RADIOLOGY I independently reviewed and interpreted CXR and see no focal opacity or pneumothorax I also reviewed radiologist's formal read.   PROCEDURES:  Critical Care performed: No  Procedures   MEDICATIONS ORDERED IN ED: Medications  ondansetron (ZOFRAN) injection 4 mg (4 mg Intravenous Given 08/23/23 0637)  sodium chloride 0.9 % bolus 1,000 mL (1,000 mLs Intravenous New Bag/Given 08/23/23 0650)  morphine (PF) 4 MG/ML injection 4 mg (4 mg Intravenous Given 08/23/23 0635)  ketorolac (TORADOL) 15 MG/ML injection 15 mg (15 mg Intravenous Given 08/23/23 2841)    IMPRESSION / MDM / ASSESSMENT AND PLAN / ED COURSE  I reviewed the triage vital signs and the nursing notes.  Patient's presentation is most consistent with acute presentation with potential threat to life or bodily function.  Differential diagnosis includes, but is not limited to, cholecystitis, choledocholithiasis, pancreatitis, GERD/gastritis/dyspepsia, ACS, appendicitis, renal colic   The patient is on the cardiac monitor to evaluate for evidence of arrhythmia and/or significant heart rate changes.  MDM:    Right upper quadrant tenderness and pain with nausea and vomiting suspect biliary (bedside ultrasound with a distended gallbladder with positive sonographic Murphy sign but no obvious signs of cholecystitis like wall thickening or pericholecystic fluid nor a large gallstones) or pancreatic pathology, though appendicitis, obstruction are also on the differential  -  will give IV pain medications antiemetic and fluids, and CT scan of the  abdomen pelvis.  Less likely cardiac related however given his cardiac history upper abdominal pain and nausea will check EKG and troponin.      FINAL CLINICAL IMPRESSION(S) / ED DIAGNOSES   Final diagnoses:  Pain of upper abdomen  Nausea and vomiting, unspecified vomiting type     Rx / DC Orders   ED Discharge Orders     None        Note:  This document was prepared using Dragon voice recognition software and may include unintentional dictation errors.    Pilar Jarvis, MD 08/23/23 [phone]

## 2023-08-23 NOTE — ED Triage Notes (Signed)
Patient ambulatory to triage with steady gait, without difficulty or distress noted; pt reports rt upper abd pain since last night accomp by N/V; denies hx of same

## 2023-08-23 NOTE — ED Provider Notes (Signed)
Procedures     ----------------------------------------- 2:15 PM on 08/23/2023 ----------------------------------------- CT and follow-up ultrasound both unremarkable. Interpreted the ultrasound myself, no gallstones or signs of cholecystitis.  Radiology report reviewed.  On reevaluation, patient reports pain is dramatically improved.  He is sitting upright in a chair.  Exam is benign, nontender.  Symptoms may be related to Ozempic side effects, most recent injection was yesterday.  He will follow-up with his primary care doctor.  Final diagnoses:  Pain of upper abdomen  Nausea and vomiting, unspecified vomiting type        Sharman Cheek, MD 08/23/23 1415

## 2023-08-27 ENCOUNTER — Ambulatory Visit: Payer: Medicare HMO | Admitting: General Practice

## 2023-08-27 ENCOUNTER — Ambulatory Visit: Payer: Medicare HMO

## 2023-08-27 ENCOUNTER — Other Ambulatory Visit: Payer: Self-pay

## 2023-08-27 ENCOUNTER — Encounter
Admission: AD | Disposition: A | Discharge status: Home / Self Care | Payer: Self-pay | Source: Ambulatory Visit | Attending: General Surgery

## 2023-08-27 ENCOUNTER — Observation Stay
Admission: AD | Admit: 2023-08-27 | Discharge: 2023-08-28 | Disposition: A | Discharge status: Home / Self Care | Payer: Medicare HMO | Source: Ambulatory Visit | Attending: General Surgery | Admitting: General Surgery

## 2023-08-27 ENCOUNTER — Encounter: Payer: Self-pay | Admitting: General Surgery

## 2023-08-27 ENCOUNTER — Ambulatory Visit: Payer: Self-pay | Admitting: General Surgery

## 2023-08-27 DIAGNOSIS — K810 Acute cholecystitis: Secondary | ICD-10-CM | POA: Diagnosis present

## 2023-08-27 DIAGNOSIS — E119 Type 2 diabetes mellitus without complications: Secondary | ICD-10-CM | POA: Insufficient documentation

## 2023-08-27 DIAGNOSIS — Z7982 Long term (current) use of aspirin: Secondary | ICD-10-CM | POA: Insufficient documentation

## 2023-08-27 DIAGNOSIS — Z7984 Long term (current) use of oral hypoglycemic drugs: Secondary | ICD-10-CM | POA: Insufficient documentation

## 2023-08-27 DIAGNOSIS — I10 Essential (primary) hypertension: Secondary | ICD-10-CM | POA: Insufficient documentation

## 2023-08-27 DIAGNOSIS — Z419 Encounter for procedure for purposes other than remedying health state, unspecified: Principal | ICD-10-CM

## 2023-08-27 DIAGNOSIS — Z79899 Other long term (current) drug therapy: Secondary | ICD-10-CM | POA: Diagnosis not present

## 2023-08-27 DIAGNOSIS — Z85828 Personal history of other malignant neoplasm of skin: Secondary | ICD-10-CM | POA: Diagnosis not present

## 2023-08-27 DIAGNOSIS — J45909 Unspecified asthma, uncomplicated: Secondary | ICD-10-CM | POA: Insufficient documentation

## 2023-08-27 HISTORY — PX: INTRAOPERATIVE CHOLANGIOGRAM: SHX5230

## 2023-08-27 LAB — GLUCOSE, CAPILLARY
Glucose-Capillary: 93 mg/dL (ref 70–99)
Glucose-Capillary: 140 mg/dL — ABNORMAL HIGH (ref 70–99)

## 2023-08-27 SURGERY — CHOLECYSTECTOMY, ROBOT-ASSISTED, LAPAROSCOPIC
Anesthesia: General | Site: Abdomen

## 2023-08-27 MED ORDER — ROCURONIUM BROMIDE 100 MG/10ML IV SOLN
INTRAVENOUS | Status: DC | PRN
  Administered 2023-08-27: 30 mg via INTRAVENOUS
  Administered 2023-08-27: 70 mg via INTRAVENOUS

## 2023-08-27 MED ORDER — SODIUM CHLORIDE 0.9 % IV SOLN: INTRAVENOUS | Status: DC

## 2023-08-27 MED ORDER — ACETAMINOPHEN 10 MG/ML IV SOLN
INTRAVENOUS | Status: DC | PRN
  Administered 2023-08-27: 1000 mg via INTRAVENOUS

## 2023-08-27 MED ORDER — 0.9 % SODIUM CHLORIDE (POUR BTL) OPTIME
TOPICAL | Status: DC | PRN
  Administered 2023-08-27: 800 mL

## 2023-08-27 MED ORDER — DEXTROSE 5 % IV SOLN
INTRAVENOUS | Status: DC | PRN
  Administered 2023-08-27: 3 g via INTRAVENOUS

## 2023-08-27 MED ORDER — ROCURONIUM BROMIDE 10 MG/ML (PF) SYRINGE
PREFILLED_SYRINGE | INTRAVENOUS | Status: AC
  Filled 2023-08-27: qty 10

## 2023-08-27 MED ORDER — DEXMEDETOMIDINE HCL IN NACL 80 MCG/20ML IV SOLN
INTRAVENOUS | Status: DC | PRN
Start: 2023-08-27 — End: 2023-08-27
  Administered 2023-08-27: 4 ug via INTRAVENOUS
  Administered 2023-08-27 (×2): 8 ug via INTRAVENOUS

## 2023-08-27 MED ORDER — OXYCODONE HCL 5 MG PO TABS: 5.0000 mg | ORAL_TABLET | Freq: Once | ORAL | Status: AC | PRN

## 2023-08-27 MED ORDER — DEXAMETHASONE SODIUM PHOSPHATE 10 MG/ML IJ SOLN
INTRAMUSCULAR | Status: AC
  Filled 2023-08-27: qty 1

## 2023-08-27 MED ORDER — BUPIVACAINE-EPINEPHRINE 0.25% -1:200000 IJ SOLN
INTRAMUSCULAR | Status: DC | PRN
  Administered 2023-08-27: 20 mL

## 2023-08-27 MED ORDER — CEFAZOLIN IN SODIUM CHLORIDE 3-0.9 GM/100ML-% IV SOLN
3.0000 g | INTRAVENOUS | Status: DC
  Filled 2023-08-27: qty 100

## 2023-08-27 MED ORDER — DEXMEDETOMIDINE HCL IN NACL 80 MCG/20ML IV SOLN
INTRAVENOUS | Status: AC
  Filled 2023-08-27: qty 20

## 2023-08-27 MED ORDER — LABETALOL HCL 5 MG/ML IV SOLN
INTRAVENOUS | Status: DC | PRN
Start: 2023-08-27 — End: 2023-08-27

## 2023-08-27 MED ORDER — HYDROCODONE-ACETAMINOPHEN 5-325 MG PO TABS
1.0000 | ORAL_TABLET | ORAL | Status: DC | PRN
  Administered 2023-08-28: 1 via ORAL
  Filled 2023-08-27: qty 1

## 2023-08-27 MED ORDER — CELECOXIB 200 MG PO CAPS
200.0000 mg | ORAL_CAPSULE | Freq: Two times a day (BID) | ORAL | Status: DC
  Administered 2023-08-27 – 2023-08-28 (×2): 200 mg via ORAL
  Filled 2023-08-27 (×2): qty 1

## 2023-08-27 MED ORDER — ACETAMINOPHEN 10 MG/ML IV SOLN
INTRAVENOUS | Status: AC
  Filled 2023-08-27: qty 100

## 2023-08-27 MED ORDER — ONDANSETRON HCL 4 MG/2ML IJ SOLN
INTRAMUSCULAR | Status: DC | PRN
  Administered 2023-08-27: 4 mg via INTRAVENOUS

## 2023-08-27 MED ORDER — ORAL CARE MOUTH RINSE: 15.0000 mL | Freq: Once | OROMUCOSAL | Status: AC

## 2023-08-27 MED ORDER — INDOCYANINE GREEN 25 MG IV SOLR
1.2500 mg | Freq: Once | INTRAVENOUS | Status: AC
  Administered 2023-08-27: 1.25 mg via INTRAVENOUS

## 2023-08-27 MED ORDER — ENOXAPARIN SODIUM 40 MG/0.4ML IJ SOSY
40.0000 mg | PREFILLED_SYRINGE | INTRAMUSCULAR | Status: DC
  Administered 2023-08-28: 40 mg via SUBCUTANEOUS
  Filled 2023-08-27: qty 0.4

## 2023-08-27 MED ORDER — OXYCODONE HCL 5 MG/5ML PO SOLN
5.0000 mg | Freq: Once | ORAL | Status: AC | PRN
  Administered 2023-08-27: 5 mg via ORAL

## 2023-08-27 MED ORDER — BUPIVACAINE-EPINEPHRINE (PF) 0.25% -1:200000 IJ SOLN
INTRAMUSCULAR | Status: AC
  Filled 2023-08-27: qty 30

## 2023-08-27 MED ORDER — AZELASTINE HCL 0.1 % NA SOLN: 2.0000 | Freq: Every evening | NASAL | Status: DC | PRN

## 2023-08-27 MED ORDER — LIDOCAINE HCL (PF) 2 % IJ SOLN
INTRAMUSCULAR | Status: AC
  Filled 2023-08-27: qty 5

## 2023-08-27 MED ORDER — MIDAZOLAM HCL 2 MG/2ML IJ SOLN
INTRAMUSCULAR | Status: DC | PRN
Start: 2023-08-27 — End: 2023-08-27
  Administered 2023-08-27: 2 mg via INTRAVENOUS

## 2023-08-27 MED ORDER — FENTANYL CITRATE (PF) 100 MCG/2ML IJ SOLN
25.0000 ug | INTRAMUSCULAR | Status: DC | PRN
  Administered 2023-08-27: 50 ug via INTRAVENOUS
  Administered 2023-08-27 (×2): 25 ug via INTRAVENOUS
  Administered 2023-08-27: 50 ug via INTRAVENOUS

## 2023-08-27 MED ORDER — OXYCODONE HCL 5 MG/5ML PO SOLN
ORAL | Status: AC
  Filled 2023-08-27: qty 5

## 2023-08-27 MED ORDER — FENTANYL CITRATE (PF) 100 MCG/2ML IJ SOLN
INTRAMUSCULAR | Status: DC | PRN
  Administered 2023-08-27 (×4): 50 ug via INTRAVENOUS

## 2023-08-27 MED ORDER — FENTANYL CITRATE (PF) 100 MCG/2ML IJ SOLN
INTRAMUSCULAR | Status: AC
  Filled 2023-08-27: qty 2

## 2023-08-27 MED ORDER — GABAPENTIN 300 MG PO CAPS
300.0000 mg | ORAL_CAPSULE | Freq: Three times a day (TID) | ORAL | Status: DC
  Administered 2023-08-27 – 2023-08-28 (×2): 300 mg via ORAL
  Filled 2023-08-27 (×2): qty 1

## 2023-08-27 MED ORDER — PHENYLEPHRINE 80 MCG/ML (10ML) SYRINGE FOR IV PUSH (FOR BLOOD PRESSURE SUPPORT)
PREFILLED_SYRINGE | INTRAVENOUS | Status: AC
  Filled 2023-08-27: qty 10

## 2023-08-27 MED ORDER — MORPHINE SULFATE (PF) 4 MG/ML IV SOLN
4.0000 mg | INTRAVENOUS | Status: DC | PRN
  Administered 2023-08-27: 4 mg via INTRAVENOUS
  Filled 2023-08-27: qty 1

## 2023-08-27 MED ORDER — SUGAMMADEX SODIUM 200 MG/2ML IV SOLN
INTRAVENOUS | Status: DC | PRN
  Administered 2023-08-27: 300 mg via INTRAVENOUS

## 2023-08-27 MED ORDER — ONDANSETRON HCL 4 MG/2ML IJ SOLN
INTRAMUSCULAR | Status: AC
  Filled 2023-08-27: qty 2

## 2023-08-27 MED ORDER — CHLORHEXIDINE GLUCONATE 0.12 % MT SOLN
OROMUCOSAL | Status: AC
  Filled 2023-08-27: qty 15

## 2023-08-27 MED ORDER — ZOLPIDEM TARTRATE 5 MG PO TABS: 10.0000 mg | ORAL_TABLET | Freq: Every day | ORAL | Status: DC

## 2023-08-27 MED ORDER — CHLORHEXIDINE GLUCONATE 0.12 % MT SOLN
15.0000 mL | Freq: Once | OROMUCOSAL | Status: AC
  Administered 2023-08-27: 15 mL via OROMUCOSAL

## 2023-08-27 MED ORDER — ISOSORBIDE MONONITRATE ER 30 MG PO TB24
30.0000 mg | ORAL_TABLET | Freq: Every morning | ORAL | Status: DC
  Administered 2023-08-28: 30 mg via ORAL
  Filled 2023-08-27: qty 1

## 2023-08-27 MED ORDER — ZOLPIDEM TARTRATE 5 MG PO TABS
5.0000 mg | ORAL_TABLET | Freq: Every day | ORAL | Status: DC
  Administered 2023-08-27: 5 mg via ORAL
  Filled 2023-08-27: qty 1

## 2023-08-27 MED ORDER — DEXAMETHASONE SODIUM PHOSPHATE 10 MG/ML IJ SOLN
INTRAMUSCULAR | Status: DC | PRN
  Administered 2023-08-27: 5 mg via INTRAVENOUS

## 2023-08-27 MED ORDER — LEFLUNOMIDE 10 MG PO TABS
20.0000 mg | ORAL_TABLET | Freq: Every day | ORAL | Status: DC
  Administered 2023-08-27 – 2023-08-28 (×2): 20 mg via ORAL
  Filled 2023-08-27 (×2): qty 2

## 2023-08-27 MED ORDER — ONDANSETRON HCL 4 MG/2ML IJ SOLN
4.0000 mg | Freq: Four times a day (QID) | INTRAMUSCULAR | Status: DC | PRN
  Administered 2023-08-27 – 2023-08-28 (×2): 4 mg via INTRAVENOUS
  Filled 2023-08-27 (×2): qty 2

## 2023-08-27 MED ORDER — MIDAZOLAM HCL 2 MG/2ML IJ SOLN
INTRAMUSCULAR | Status: AC
  Filled 2023-08-27: qty 2

## 2023-08-27 MED ORDER — IOHEXOL 180 MG/ML  SOLN
INTRAMUSCULAR | Status: DC | PRN
  Administered 2023-08-27: 20 mL
  Administered 2023-08-27: 10 mL

## 2023-08-27 MED ORDER — LIDOCAINE HCL (CARDIAC) PF 100 MG/5ML IV SOSY
PREFILLED_SYRINGE | INTRAVENOUS | Status: DC | PRN
  Administered 2023-08-27: 100 mg via INTRAVENOUS

## 2023-08-27 MED ORDER — PROPOFOL 10 MG/ML IV BOLUS
INTRAVENOUS | Status: DC | PRN
  Administered 2023-08-27: 200 mg via INTRAVENOUS

## 2023-08-27 MED ORDER — INDOCYANINE GREEN 25 MG IV SOLR
INTRAVENOUS | Status: AC
  Filled 2023-08-27: qty 10

## 2023-08-27 MED ORDER — "VISTASEAL 4 ML SINGLE DOSE KIT "
PACK | CUTANEOUS | Status: AC
  Filled 2023-08-27: qty 4

## 2023-08-27 MED ORDER — ONDANSETRON 4 MG PO TBDP
4.0000 mg | ORAL_TABLET | Freq: Four times a day (QID) | ORAL | Status: DC | PRN
  Administered 2023-08-28: 4 mg via ORAL
  Filled 2023-08-27: qty 1

## 2023-08-27 MED ORDER — PANTOPRAZOLE SODIUM 20 MG PO TBEC
20.0000 mg | DELAYED_RELEASE_TABLET | Freq: Every day | ORAL | Status: DC
  Administered 2023-08-27 – 2023-08-28 (×2): 20 mg via ORAL
  Filled 2023-08-27 (×2): qty 1

## 2023-08-27 MED ORDER — LABETALOL HCL 5 MG/ML IV SOLN
INTRAVENOUS | Status: DC | PRN
Start: 2023-08-27 — End: 2023-08-27
  Administered 2023-08-27 (×2): 2.5 mg via INTRAVENOUS

## 2023-08-27 MED ORDER — LACTATED RINGERS IV SOLN: INTRAVENOUS | Status: DC | PRN

## 2023-08-27 SURGICAL SUPPLY — 53 items
ADH SKN CLS APL DERMABOND .7 (GAUZE/BANDAGES/DRESSINGS) ×2
APL LAPSCP 35 DL APL RGD (MISCELLANEOUS) ×2
APPLICATOR VISTASEAL 35 (MISCELLANEOUS) IMPLANT
BAG PRESSURE INF REUSE 1000 (BAG) IMPLANT
CANNULA REDUCER 12-8 DVNC XI (CANNULA) ×2 IMPLANT
CATH REDDICK CHOLANGI 4FR 50CM (CATHETERS) IMPLANT
CAUTERY HOOK MNPLR 1.6 DVNC XI (INSTRUMENTS) ×2 IMPLANT
CLIP LIGATING HEM O LOK PURPLE (MISCELLANEOUS) IMPLANT
CLIP LIGATING HEMO O LOK GREEN (MISCELLANEOUS) ×2 IMPLANT
DERMABOND ADVANCED .7 DNX12 (GAUZE/BANDAGES/DRESSINGS) ×2 IMPLANT
DRAPE ARM DVNC X/XI (DISPOSABLE) ×8 IMPLANT
DRAPE C-ARM XRAY 36X54 (DRAPES) IMPLANT
DRAPE COLUMN DVNC XI (DISPOSABLE) ×2 IMPLANT
ELECT REM PT RETURN 9FT ADLT (ELECTROSURGICAL) ×2
ELECTRODE REM PT RTRN 9FT ADLT (ELECTROSURGICAL) ×2 IMPLANT
FORCEPS BPLR 8 MD DVNC XI (FORCEP) ×2 IMPLANT
FORCEPS BPLR R/ABLATION 8 DVNC (INSTRUMENTS) ×2 IMPLANT
FORCEPS PROGRASP DVNC XI (FORCEP) ×2 IMPLANT
GLOVE BIO SURGEON STRL SZ 6.5 (GLOVE) ×4 IMPLANT
GLOVE BIOGEL PI IND STRL 6.5 (GLOVE) ×4 IMPLANT
GOWN STRL REUS W/ TWL LRG LVL3 (GOWN DISPOSABLE) ×6 IMPLANT
GOWN STRL REUS W/TWL LRG LVL3 (GOWN DISPOSABLE) ×6
GRASPER SUT TROCAR 14GX15 (MISCELLANEOUS) ×2 IMPLANT
IRRIGATOR SUCT 8 DISP DVNC XI (IRRIGATION / IRRIGATOR) IMPLANT
IV CATH ANGIO 12GX3 LT BLUE (NEEDLE) IMPLANT
IV NS 1000ML (IV SOLUTION) ×2
IV NS 1000ML BAXH (IV SOLUTION) IMPLANT
KIT PINK PAD W/HEAD ARE REST (MISCELLANEOUS) ×2 IMPLANT
KIT PINK PAD W/HEAD ARM REST (MISCELLANEOUS) ×2 IMPLANT
LABEL OR SOLS (LABEL) ×2 IMPLANT
MANIFOLD NEPTUNE II (INSTRUMENTS) ×2 IMPLANT
NDL HYPO 22X1.5 SAFETY MO (MISCELLANEOUS) ×2 IMPLANT
NDL INSUFFLATION 14GA 120MM (NEEDLE) ×2 IMPLANT
NEEDLE HYPO 22X1.5 SAFETY MO (MISCELLANEOUS) ×2 IMPLANT
NEEDLE INSUFFLATION 14GA 120MM (NEEDLE) ×2 IMPLANT
NS IRRIG 500ML POUR BTL (IV SOLUTION) ×2 IMPLANT
OBTURATOR OPTICAL STND 8 DVNC (TROCAR) ×2
OBTURATOR OPTICALSTD 8 DVNC (TROCAR) ×2 IMPLANT
PACK LAP CHOLECYSTECTOMY (MISCELLANEOUS) ×2 IMPLANT
SEAL UNIV 5-12 XI (MISCELLANEOUS) ×8 IMPLANT
SET TUBE SMOKE EVAC HIGH FLOW (TUBING) ×2 IMPLANT
SOL ELECTROSURG ANTI STICK (MISCELLANEOUS) ×2
SOLUTION ELECTROSURG ANTI STCK (MISCELLANEOUS) ×2 IMPLANT
SPIKE FLUID TRANSFER (MISCELLANEOUS) ×4 IMPLANT
SPONGE T-LAP 4X18 ~~LOC~~+RFID (SPONGE) IMPLANT
SUT MNCRL 4-0 (SUTURE) ×2
SUT MNCRL 4-0 27XMFL (SUTURE) ×2
SUT VICRYL 0 UR6 27IN ABS (SUTURE) ×2 IMPLANT
SUTURE MNCRL 4-0 27XMF (SUTURE) ×2 IMPLANT
SYS BAG RETRIEVAL 10MM (BASKET) ×2
SYSTEM BAG RETRIEVAL 10MM (BASKET) ×2 IMPLANT
TRAP FLUID SMOKE EVACUATOR (MISCELLANEOUS) ×2 IMPLANT
WATER STERILE IRR 500ML POUR (IV SOLUTION) ×2 IMPLANT

## 2023-08-27 NOTE — Anesthesia Procedure Notes (Signed)
Procedure Name: Intubation Date/Time: 08/27/2023 2:58 PM  Performed by: Elmarie Mainland, CRNAPre-anesthesia Checklist: Patient identified, Emergency Drugs available, Suction available and Patient being monitored Patient Re-evaluated:Patient Re-evaluated prior to induction Oxygen Delivery Method: Circle system utilized Preoxygenation: Pre-oxygenation with 100% oxygen Induction Type: IV induction Ventilation: Oral airway inserted - appropriate to patient size and Two handed mask ventilation required Laryngoscope Size: McGraph and 4 Grade View: Grade I Tube type: Oral Tube size: 7.0 mm Number of attempts: 1 Airway Equipment and Method: Stylet, Video-laryngoscopy and Oral airway Placement Confirmation: ETT inserted through vocal cords under direct vision, positive ETCO2 and breath sounds checked- equal and bilateral Secured at: 22 cm Tube secured with: Tape Dental Injury: Teeth and Oropharynx as per pre-operative assessment

## 2023-08-27 NOTE — H&P (View-Only) (Signed)
PATIENT PROFILE: Bobby Tanner is a 72 y.o. male who presents to the Clinic for consultation at the request of Dr. Hyacinth Meeker for evaluation of acute cholecystitis.  PCP:  Carlynn Purl, MD  HISTORY OF PRESENT ILLNESS: Bobby Tanner reports he has been having abdominal pain for the last 4 days.  He endorses that he had pain severe 10 out of 10 in the right upper quadrant.  Pain radiated to the right back.  Patient cannot identify any alleviating or aggravating factors.  There is no improvement of the pain in the right upper quadrant but still persistent.  Patient endorses associated nausea and vomiting.  He went to the primary care for follow-up today and he was found with tenderness to palpation in the right upper quadrant.  Labs shows leukocytosis.  There is mild elevation of bilirubin.  Patient had a CT scan of the abdomen and pelvis 4 days ago that showed cholelithiasis with Bladder wall thickening.  Ultrasound of the gallbladder does not show dilated common bile duct dilation.   PROBLEM LIST: Problem List  Date Reviewed: 07/23/2023          Noted   Major depressive disorder, recurrent, mild (CMS-HCC) 08/27/2023   Overview    Lexapro, 9/24      Mild aortic stenosis 06/05/2023   Overview    4/24 echo      Hepatic hemangioma 10/12/2022   Rheumatoid arthritis of multiple sites with negative rheumatoid factor (CMS/HHS-HCC) 03/15/2022   Medicare annual wellness visit, initial 11/08/2021   Overview    12/22, 1/24      History of 2019 novel coronavirus disease (COVID-19) 01/01/2020   Overview    2/21, stopped nebulizers 4/21, chest x-ray normalized 5/21      Lumbar radiculopathy 11/18/2019   Overview    LS MRI 12/20, significant L L3-4/4-5 disease, gabapentin at bedtime      Type 2 diabetes mellitus with peripheral angiopathy (CMS/HHS-HCC) 10/07/2018   Overview    Metformin diarrhea, glipizide      Tubular adenoma 10/07/2018   Overview    10/19 x 2 12/23 x 3       Hyperlipidemia, mixed May 08, 2016   2-vessel coronary artery disease 05-08-16   Overview    Sudden death, Apr 18, 2023, 99% LAD post PCI, EF 50%, 50% diagonal 1 residual, [place] 1/18 Myoview normal, 7/21 normal SPECT 10/22 moderate proximal RCA disease      Essential hypertension with goal blood pressure less than 140/90 04/12/2015   GERD (gastroesophageal reflux disease) 04/09/2014   OSA (obstructive sleep apnea) 04/09/2014   Overview    10cm H20, study 7/15 Followed [facility]       GENERAL REVIEW OF SYSTEMS:   General ROS: negative for - chills, fatigue, fever, weight gain.  Positive for weight loss Allergy and Immunology ROS: negative for - hives  Hematological and Lymphatic ROS: negative for - bleeding problems or bruising, negative for palpable nodes Endocrine ROS: negative for - heat or cold intolerance, hair changes Respiratory ROS: negative for - cough, shortness of breath or wheezing Cardiovascular ROS: no chest pain or palpitations GI ROS: Positive for nausea, vomiting, abdominal pain Musculoskeletal ROS: Positive for - joint swelling or muscle pain Neurological ROS: negative for - confusion, syncope Dermatological ROS: negative for pruritus and rash Psychiatric: negative for anxiety, depression, difficulty sleeping and memory loss  MEDICATIONS: Current Outpatient Medications  Medication Sig Dispense Refill   aspirin 81 MG EC tablet Take 81 mg by mouth once daily.  escitalopram oxalate (LEXAPRO) 5 MG tablet Take 1 tablet (5 mg total) by mouth once daily for 90 days 30 tablet 2   ezetimibe (ZETIA) 10 mg tablet      fluticasone propionate (FLONASE) 50 mcg/actuation nasal spray PLACE 2 SPRAYS INTO BOTH NOSTRILS ONCE DAILY FOR 90 DAYS 16 g 3   folic acid (FOLVITE) 1 MG tablet Take 1 tablet (1 mg total) by mouth once daily 90 tablet 3   FUROsemide (LASIX) 20 MG tablet TAKE 1 TABLET BY MOUTH ONCE DAILY AS NEEDED FOR EDEMA 90 tablet 1   gabapentin (NEURONTIN) 300 MG capsule 1  tab a.m., 3 tabs at bedtime (Patient taking differently: 1 tab a.m., 2 tabs at bedtime) 360 capsule 3   glipiZIDE (GLUCOTROL) 5 MG tablet Take 1 tablet (5 mg total) by mouth every morning before breakfast 90 tablet 3   isosorbide mononitrate (IMDUR) 30 MG ER tablet Take 1 tablet (30 mg total) by mouth once daily 90 tablet 3   lansoprazole (PREVACID) 30 MG DR capsule Take 1 capsule (30 mg total) by mouth once daily 90 capsule 3   latanoprost (XALATAN) 0.005 % ophthalmic solution INSTILL 1 DROP INTO LEFT EYE EVERY NIGHT 7.5 mL 1   meloxicam (MOBIC) 15 MG tablet Take 1 tablet (15 mg total) by mouth once daily as needed (Inflammation) (Patient taking differently: Take 15 mg by mouth once daily as needed (Inflammation) TAKES DAILY) 90 tablet 3   methotrexate (RHEUMATREX) 2.5 MG tablet Take 8 tablets (20 mg total) by mouth every 7 (seven) days All on the same day 32 tablet 2   montelukast (SINGULAIR) 10 mg tablet TAKE 1 TABLET (10 MG TOTAL) BY MOUTH ONCE DAILY AS NEEDED (ALLERGY) 90 tablet 3   nitroGLYcerin (NITROSTAT) 0.4 MG SL tablet Place 1 tablet (0.4 mg total) under the tongue every 5 (five) minutes as needed for Chest pain May take up to 3 doses. 25 tablet 0   phentermine (ADIPEX-P) 15 MG capsule Take 1 capsule (15 mg total) by mouth every other day for 180 days 15 capsule 5   rosuvastatin (CRESTOR) 20 MG tablet Take 2 tablets (40 mg total) by mouth once daily 90 tablet 3   semaglutide (OZEMPIC) 0.25 mg or 0.5 mg (2 mg/3 mL) pen injector Inject 0.75 mLs (0.5 mg total) subcutaneously once a week 3 mL 5   tadalafiL (CIALIS) 20 MG tablet Take 1 tablet (20 mg total) by mouth once daily as needed for Erectile Dysfunction for up to 160 days 10 tablet 5   zolpidem (AMBIEN) 10 mg tablet TAKE 1 TABLET BY MOUTH EVERY DAY AT NIGHT 90 tablet 1   No current facility-administered medications for this visit.    ALLERGIES: Grass pollen, Other, Tree and shrub pollen, Cat dander, Dog dander, and Plaquenil  [hydroxychloroquine]  PAST MEDICAL HISTORY: Past Medical History:  Diagnosis Date   Allergy    Grass, dog hair, pollen   Arthritis 2015   Nothing   Asthma without status asthmaticus (HHS-HCC) Childhood   No episodes for several years.   Cancer (CMS/HHS-HCC) Last 6 years   Skin Cancers   Cataract cortical, senile 2017   Right eye cat was removed   DM type 2 with diabetic mixed hyperlipidemia  (CMS/HHS-HCC) 04/08/2018   GERD (gastroesophageal reflux disease) 04/09/2014   Heart disease    Hx of vertigo    Hyperlipidemia 04/09/2014   Hypertension 10 years   Controlled by medication.   Myocardial infarction (CMS/HHS-HCC) 04/10/2016   OSA (obstructive  sleep apnea) 04/09/2014    PAST SURGICAL HISTORY: Past Surgical History:  Procedure Laterality Date   CATARACT EXTRACTION Right 04/05/2016   CORONARY ANGIOPLASTY  04/10/2016   Successful   COLONOSCOPY  09/11/2018   Tubular adenoma of the colon/Repeat 89yrs/TKT   skin cancer removal of face  2023   Colon @ [facility]  10/31/2022   Tubular adenomas/PHx CP/Repeat 65yrs/TKT   cardiac cath     2022     FAMILY HISTORY: Family History  Problem Relation Name Age of Onset   Arthritis Mother Marland Mcalpine Hanratty    Alzheimer's disease Mother Marland Mcalpine Liuzzi    Coronary Artery Disease (Blocked arteries around heart) Mother Marland Mcalpine Wanner    High blood pressure (Hypertension) Mother Marland Mcalpine Lucia    Skin cancer Mother Marland Mcalpine Dunckel    Stroke Mother Marland Mcalpine Wavra    Macular degeneration Mother Marland Mcalpine Brann    Arthritis Father Aggie Moats Mcclurkin    Coronary Artery Disease (Blocked arteries around heart) Father Aggie Moats Hunsucker    High blood pressure (Hypertension) Father Aggie Moats Grieser    High blood pressure (Hypertension) Brother Tim Golomb    Cancer Maternal Aunt     Arthritis Maternal Grandmother     Arthritis Maternal Grandfather     Cancer Maternal Grandfather     Arthritis Paternal Grandmother     Arthritis  Paternal Grandfather WB Weideman    High blood pressure (Hypertension) Paternal Grandfather WB Sylve    Glaucoma Neg Hx       SOCIAL HISTORY: Social History   Socioeconomic History   Marital status: Married  Tobacco Use   Smoking status: Never    Passive exposure: Never   Smokeless tobacco: Never  Vaping Use   Vaping status: Never Used  Substance and Sexual Activity   Alcohol use: Yes    Comment: One drink every month or two. Very seldom.   Drug use: No   Sexual activity: Yes    Partners: Female    Birth control/protection: Surgical, None  Social History Narrative   Pt lives with his wife, Agustin Cree. Pt is a Radio producer.     PHYSICAL EXAM: Vitals:   08/27/23 1303  BP: (!) 150/82   Body mass index is 38.91 kg/m. Weight: (!) 126.6 kg (279 lb)   GENERAL: Alert, active, oriented x3  HEENT: Pupils equal reactive to light. Extraocular movements are intact. Sclera clear. Palpebral conjunctiva normal red color.Pharynx clear.  NECK: Supple with no palpable mass and no adenopathy.  LUNGS: Sound clear with no rales rhonchi or wheezes.  HEART: Regular rhythm S1 and S2 without murmur.  ABDOMEN: Soft and depressible, but tender to palpation in the right upper quadrant.   EXTREMITIES: Well-developed well-nourished symmetrical with no dependent edema.  NEUROLOGICAL: Awake alert oriented, facial expression symmetrical, moving all extremities.  REVIEW OF DATA: I have reviewed the following data today: Office Visit on 08/27/2023  Component Date Value   WBC (White Blood Cell Co* 08/27/2023 11.0 (H)    RBC (Red Blood Cell Coun* 08/27/2023 4.38 (L)    Hemoglobin 08/27/2023 13.1 (L)    Hematocrit 08/27/2023 40.6    MCV (Mean Corpuscular Vo* 08/27/2023 92.7    MCH (Mean Corpuscular He* 08/27/2023 29.9    MCHC (Mean Corpuscular H* 08/27/2023 32.3    Platelet Count 08/27/2023 186    RDW-CV (Red Cell Distrib* 08/27/2023 16.2 (H)    MPV (Mean Platelet Volum* 08/27/2023 10.5     Neutrophils 08/27/2023  6.82    Lymphocytes 08/27/2023 2.37    Monocytes 08/27/2023 1.05    Eosinophils 08/27/2023 0.65 (H)    Basophils 08/27/2023 0.11 (H)    Neutrophil % 08/27/2023 61.7    Lymphocyte % 08/27/2023 21.5    Monocyte % 08/27/2023 9.5    Eosinophil % 08/27/2023 5.9 (H)    Basophil% 08/27/2023 1.0    Immature Granulocyte % 08/27/2023 0.4    Immature Granulocyte Cou* 08/27/2023 0.04    Glucose 08/27/2023 109    Sodium 08/27/2023 141    Potassium 08/27/2023 5.0    Chloride 08/27/2023 105    Carbon Dioxide (CO2) 08/27/2023 29.2    Urea Nitrogen (BUN) 08/27/2023 12    Creatinine 08/27/2023 0.9    Glomerular Filtration Ra* 08/27/2023 91    Calcium 08/27/2023 9.3    AST  08/27/2023 24    ALT  08/27/2023 29    Alk Phos (alkaline Phosp* 08/27/2023 101    Albumin 08/27/2023 3.9    Bilirubin, Total 08/27/2023 1.5 (H)    Protein, Total 08/27/2023 7.1    A/G Ratio 08/27/2023 1.2   Office Visit on 07/23/2023  Component Date Value   ALT  07/23/2023 27    Albumin 07/23/2023 3.9    AST  07/23/2023 24    WBC (White Blood Cell Co* 07/23/2023 7.6    RBC (Red Blood Cell Coun* 07/23/2023 4.33 (L)    Hemoglobin 07/23/2023 12.8 (L)    Hematocrit 07/23/2023 38.9 (L)    MCV (Mean Corpuscular Vo* 07/23/2023 89.8    MCH (Mean Corpuscular He* 07/23/2023 29.6    MCHC (Mean Corpuscular H* 07/23/2023 32.9    Platelet Count 07/23/2023 161    RDW-CV (Red Cell Distrib* 07/23/2023 15.4 (H)    MPV (Mean Platelet Volum* 07/23/2023 10.9    Neutrophils 07/23/2023 3.77    Lymphocytes 07/23/2023 2.65    Monocytes 07/23/2023 0.56    Eosinophils 07/23/2023 0.52    Basophils 07/23/2023 0.07    Neutrophil % 07/23/2023 49.6    Lymphocyte % 07/23/2023 34.9    Monocyte % 07/23/2023 7.4    Eosinophil % 07/23/2023 6.8 (H)    Basophil% 07/23/2023 0.9    Immature Granulocyte % 07/23/2023 0.4    Immature Granulocyte Cou* 07/23/2023 0.03    Creatinine 07/23/2023 0.9    Glomerular Filtration Ra*  07/23/2023 91    Sedimentation Rate-Autom* 07/23/2023 17    C Reactive Protein - Lab* 07/23/2023 2    QuantiFERON Incubation -* 07/23/2023 Incubation performed.    QuantiFERON Criteria - L* 07/23/2023 Comment    QuantiFERON TB1 Ag Value* 07/23/2023 0.01    QuantiFERON TB2 Ag Value* 07/23/2023 0.01    QuantiFERON Nil Value - * 07/23/2023 0.01    QuantiFERON Mitogen Valu* 07/23/2023 >10.00    QuantiFERON TB Gold - La* 07/23/2023 Negative   Appointment on 06/04/2023  Component Date Value   WBC (White Blood Cell Co* 06/04/2023 7.8    RBC (Red Blood Cell Coun* 06/04/2023 4.54 (L)    Hemoglobin 06/04/2023 13.4 (L)    Hematocrit 06/04/2023 40.9    MCV (Mean Corpuscular Vo* 06/04/2023 90.1    MCH (Mean Corpuscular He* 06/04/2023 29.5    MCHC (Mean Corpuscular H* 06/04/2023 32.8    Platelet Count 06/04/2023 162    RDW-CV (Red Cell Distrib* 06/04/2023 14.6    MPV (Mean Platelet Volum* 06/04/2023 12.3    Neutrophils 06/04/2023 3.47    Lymphocytes 06/04/2023 2.92    Monocytes 06/04/2023 0.61    Eosinophils 06/04/2023  0.71 (H)    Basophils 06/04/2023 0.11 (H)    Neutrophil % 06/04/2023 44.3    Lymphocyte % 06/04/2023 37.3    Monocyte % 06/04/2023 7.8    Eosinophil % 06/04/2023 9.1 (H)    Basophil% 06/04/2023 1.4    Immature Granulocyte % 06/04/2023 0.1    Immature Granulocyte Cou* 06/04/2023 0.01    Glucose 06/04/2023 106    Sodium 06/04/2023 141    Potassium 06/04/2023 4.3    Chloride 06/04/2023 106    Carbon Dioxide (CO2) 06/04/2023 29.0    Urea Nitrogen (BUN) 06/04/2023 15    Creatinine 06/04/2023 1.0    Glomerular Filtration Ra* 06/04/2023 80    Calcium 06/04/2023 8.8    AST  06/04/2023 18    ALT  06/04/2023 13    Alk Phos (alkaline Phosp* 06/04/2023 64    Albumin 06/04/2023 3.9    Bilirubin, Total 06/04/2023 0.9    Protein, Total 06/04/2023 6.2    A/G Ratio 06/04/2023 1.7    Hemoglobin A1C 06/04/2023 6.6 (H)    Average Blood Glucose (C* 06/04/2023 143    Cholesterol, Total  06/04/2023 96 (L)    Triglyceride 06/04/2023 119    HDL (High Density Lipopr* 16/08/9603 41.7    LDL Calculated 06/04/2023 31    VLDL Cholesterol 06/04/2023 24    Cholesterol/HDL Ratio 06/04/2023 2.3    Thyroid Stimulating Horm* 06/04/2023 3.498      ASSESSMENT: Bobby Tanner is a 72 y.o. male presenting for consultation for clinical finding concerning for cholecystitis, possible choledocholithiasis.  Patient was oriented about the diagnosis of acute cholecystitis. Also oriented about what is the gallbladder, its anatomy and function and the implications of having stones. The patient was oriented about the treatment alternatives and due to elevated bilirubin today we discussed about cholecystectomy with cholangiogram. Patient was oriented that a low percentage of patient will continue to have similar pain symptoms even after the gallbladder is removed. Surgical technique (open vs laparoscopic) was discussed. It was also discussed the goals of the surgery (decrease the pain episodes and rule out choledocholithiasis) and the risk of surgery including: bleeding, infection, common bile duct injury, stone retention, injury to other organs such as bowel, liver, stomach, other complications such as hernia, bowel obstruction among others. Also discussed with patient about anesthesia and its complications such as: reaction to medications, pneumonia, heart complications, death, among others.   Cholecystitis, acute [K81.0]  PLAN: 1.  Will proceed with urgent robotic assisted laparoscopic cholecystectomy with cholangiogram (54098) 2.  CBC, CMP done 3. Contact us if has any question or concern.   Patient and his wife verbalized understanding, all questions were answered, and were agreeable with the plan outlined above.     Carolan Shiver, MD  Electronically signed by Carolan Shiver, MD

## 2023-08-27 NOTE — Interval H&P Note (Signed)
History and Physical Interval Note:  08/27/2023 2:18 PM  Bobby Tanner  has presented today for surgery, with the diagnosis of Cholecystitis.  The various methods of treatment have been discussed with the patient and family. After consideration of risks, benefits and other options for treatment, the patient has consented to  Procedure(s): XI ROBOTIC ASSISTED LAPAROSCOPIC CHOLECYSTECTOMY (N/A) INTRAOPERATIVE CHOLANGIOGRAM (N/A) as a surgical intervention.  The patient's history has been reviewed, patient examined, no change in status, stable for surgery.  I have reviewed the patient's chart and labs.  Questions were answered to the patient's satisfaction.     Carolan Shiver

## 2023-08-27 NOTE — Transfer of Care (Signed)
Immediate Anesthesia Transfer of Care Note  Patient: Bobby Tanner  Procedure(s) Performed: XI ROBOTIC ASSISTED LAPAROSCOPIC CHOLECYSTECTOMY (Abdomen) INTRAOPERATIVE CHOLANGIOGRAM (Abdomen) INDOCYANINE GREEN FLUORESCENCE IMAGING (ICG) (Abdomen)  Patient Location: PACU  Anesthesia Type:General  Level of Consciousness: drowsy  Airway & Oxygen Therapy: Patient Spontanous Breathing and Patient connected to face mask oxygen  Post-op Assessment: Report given to RN and Post -op Vital signs reviewed and stable  Post vital signs: Reviewed and stable  Last Vitals:  Vitals Value Taken Time  BP 152/69 08/27/23 1733  Temp 36.9 C 08/27/23 1730  Pulse 67 08/27/23 1736  Resp 17 08/27/23 1736  SpO2 100 % 08/27/23 1736  Vitals shown include unfiled device data.  Last Pain:  Vitals:   08/27/23 1348  TempSrc: Temporal  PainSc: 1          Complications: No notable events documented.

## 2023-08-27 NOTE — Anesthesia Postprocedure Evaluation (Signed)
Anesthesia Post Note  Patient: Bobby Tanner  Procedure(s) Performed: XI ROBOTIC ASSISTED LAPAROSCOPIC CHOLECYSTECTOMY (Abdomen) INTRAOPERATIVE CHOLANGIOGRAM (Abdomen) INDOCYANINE GREEN FLUORESCENCE IMAGING (ICG) (Abdomen)  Patient location during evaluation: PACU Anesthesia Type: General Level of consciousness: awake and alert Pain management: pain level controlled Vital Signs Assessment: post-procedure vital signs reviewed and stable Respiratory status: spontaneous breathing, nonlabored ventilation, respiratory function stable and patient connected to nasal cannula oxygen Cardiovascular status: blood pressure returned to baseline and stable Postop Assessment: no apparent nausea or vomiting Anesthetic complications: no   No notable events documented.   Last Vitals:  Vitals:   08/27/23 1732 08/27/23 1745  BP: (!) 152/69 (!) 146/65  Pulse: 69 67  Resp: 17 13  Temp:    SpO2: 100% 97%    Last Pain:  Vitals:   08/27/23 1745  TempSrc:   PainSc: 8                  Louie Boston

## 2023-08-27 NOTE — Op Note (Signed)
Preoperative diagnosis: Acute cholecystitis  Postoperative diagnosis: Acute cholecystitis  Procedure: Robotic Assisted Laparoscopic Cholecystectomy with intra operative cholangiogram.                       Anesthesia: GETA   Surgeon: Dr. Hazle Quant  Wound Classification: Clean Contaminated  Indications: Patient is a 72 y.o. male developed right upper quadrant pain, nausea, vomiting, fever, leukocytosis and on workup was found to have cholelithiasis with a normal common duct but elevated bilirubin. Clinically concerning for cholecystitis and choledocholithiasis. Robotic Assisted Laparoscopic cholecystectomy with cholangiogram was elected.  Findings:  Critical view of safety achieved No convincing filling defect identified on cholangiogram.  Adequate hemostasis  Description of procedure: The patient was placed on the operating table in the supine position. General anesthesia was induced. A time-out was completed verifying correct patient, procedure, site, positioning, and implant(s) and/or special equipment prior to beginning this procedure. An orogastric tube was placed. The abdomen was prepped and draped in the usual sterile fashion.  An incision was made in a natural skin line below the umbilicus.  The fascia was elevated and the Veress needle inserted. Proper position was confirmed by aspiration and saline meniscus test.  The abdomen was insufflated with carbon dioxide to a pressure of 15 mmHg. The patient tolerated insufflation well. A 8-mm trocar was then inserted in optiview fashion.  The laparoscope was inserted and the abdomen inspected. No injuries from initial trocar placement were noted. Additional trocars were then inserted in the following locations: an 8-mm trocar in the left lateral abdomen, and another two 8-mm trocars to the right side of the abdomen 5 cm appart. The umbilical trocar was changed to a 12 mm trocar all under direct visualization. The abdomen was inspected and  no abnormalities were found. The table was placed in the reverse Trendelenburg position with the right side up. The robotic arms were docked and target anatomy identified. Instrument inserted under direct visualization.  Adhesions between the gallbladder and omentum, duodenum and transverse colon were lysed with electrocautery. The dome of the gallbladder was grasped with a prograsp and retracted over the dome of the liver. The infundibulum was also grasped with an atraumatic grasper and retracted toward the right lower quadrant. This maneuver exposed Calot's triangle. The peritoneum overlying the gallbladder infundibulum was then incised and the cystic duct and cystic artery identified and circumferentially dissected. Critical view of safety reviewed before ligating any structure. Firefly images taken to visualize biliary ducts. The cystic duct and cystic artery were then clipped close to the gallbladder and divided. Cholangiogram catheter was inserted through the cystic duct.  Cholangiogram images were obtained.  No obvious filling defect was identified.  Catheter was removed.  Cystic duct was then doubly clipped. The gallbladder was then dissected from its peritoneal attachments by electrocautery. Hemostasis was checked and the gallbladder was removed using an endoscopic retrieval bag. The gallbladder was passed off the table as a specimen. The gallbladder fossa was copiously irrigated with saline and hemostasis was obtained. There was no evidence of bleeding from the gallbladder fossa or cystic artery or leakage of the bile from the cystic duct stump.  Vistaseal was irrigated in the gallbladder fossa.  Secondary trocars were removed under direct vision. No bleeding was noted. The robotic arms were undoked. The scope was withdrawn and the umbilical trocar removed. The abdomen was allowed to collapse. The fascia of the 12mm trocar sites was closed with figure-of-eight 0 vicryl sutures. The skin was closed  with  subcuticular sutures of 4-0 monocryl and topical skin adhesive. The orogastric tube was removed.  The patient tolerated the procedure well and was taken to the postanesthesia care unit in stable condition.   Specimen: Gallbladder  Complications: None  EBL: 50 mL

## 2023-08-27 NOTE — H&P (Signed)
PATIENT PROFILE: Bobby Tanner is a 72 y.o. male who presents to the Clinic for consultation at the request of Dr. Hyacinth Meeker for evaluation of acute cholecystitis.  PCP:  Carlynn Purl, MD  HISTORY OF PRESENT ILLNESS: Mr. Bobby Tanner reports he has been having abdominal pain for the last 4 days.  He endorses that he had pain severe 10 out of 10 in the right upper quadrant.  Pain radiated to the right back.  Patient cannot identify any alleviating or aggravating factors.  There is no improvement of the pain in the right upper quadrant but still persistent.  Patient endorses associated nausea and vomiting.  He went to the primary care for follow-up today and he was found with tenderness to palpation in the right upper quadrant.  Labs shows leukocytosis.  There is mild elevation of bilirubin.  Patient had a CT scan of the abdomen and pelvis 4 days ago that showed cholelithiasis with Bladder wall thickening.  Ultrasound of the gallbladder does not show dilated common bile duct dilation.   PROBLEM LIST: Problem List  Date Reviewed: 07/23/2023          Noted   Major depressive disorder, recurrent, mild (CMS-HCC) 08/27/2023   Overview    Lexapro, 9/24      Mild aortic stenosis 06/05/2023   Overview    4/24 echo      Hepatic hemangioma 10/12/2022   Rheumatoid arthritis of multiple sites with negative rheumatoid factor (CMS/HHS-HCC) 03/15/2022   Medicare annual wellness visit, initial 11/08/2021   Overview    12/22, 1/24      History of 2019 novel coronavirus disease (COVID-19) 01/01/2020   Overview    2/21, stopped nebulizers 4/21, chest x-ray normalized 5/21      Lumbar radiculopathy 11/18/2019   Overview    LS MRI 12/20, significant L L3-4/4-5 disease, gabapentin at bedtime      Type 2 diabetes mellitus with peripheral angiopathy (CMS/HHS-HCC) 10/07/2018   Overview    Metformin diarrhea, glipizide      Tubular adenoma 10/07/2018   Overview    10/19 x 2 12/23 x 3       Hyperlipidemia, mixed May 08, 2016   2-vessel coronary artery disease 05-08-16   Overview    Sudden death, Apr 18, 2023, 99% LAD post PCI, EF 50%, 50% diagonal 1 residual, [place] 1/18 Myoview normal, 7/21 normal SPECT 10/22 moderate proximal RCA disease      Essential hypertension with goal blood pressure less than 140/90 04/12/2015   GERD (gastroesophageal reflux disease) 04/09/2014   OSA (obstructive sleep apnea) 04/09/2014   Overview    10cm H20, study 7/15 Followed [facility]       GENERAL REVIEW OF SYSTEMS:   General ROS: negative for - chills, fatigue, fever, weight gain.  Positive for weight loss Allergy and Immunology ROS: negative for - hives  Hematological and Lymphatic ROS: negative for - bleeding problems or bruising, negative for palpable nodes Endocrine ROS: negative for - heat or cold intolerance, hair changes Respiratory ROS: negative for - cough, shortness of breath or wheezing Cardiovascular ROS: no chest pain or palpitations GI ROS: Positive for nausea, vomiting, abdominal pain Musculoskeletal ROS: Positive for - joint swelling or muscle pain Neurological ROS: negative for - confusion, syncope Dermatological ROS: negative for pruritus and rash Psychiatric: negative for anxiety, depression, difficulty sleeping and memory loss  MEDICATIONS: Current Outpatient Medications  Medication Sig Dispense Refill   aspirin 81 MG EC tablet Take 81 mg by mouth once daily.  escitalopram oxalate (LEXAPRO) 5 MG tablet Take 1 tablet (5 mg total) by mouth once daily for 90 days 30 tablet 2   ezetimibe (ZETIA) 10 mg tablet      fluticasone propionate (FLONASE) 50 mcg/actuation nasal spray PLACE 2 SPRAYS INTO BOTH NOSTRILS ONCE DAILY FOR 90 DAYS 16 g 3   folic acid (FOLVITE) 1 MG tablet Take 1 tablet (1 mg total) by mouth once daily 90 tablet 3   FUROsemide (LASIX) 20 MG tablet TAKE 1 TABLET BY MOUTH ONCE DAILY AS NEEDED FOR EDEMA 90 tablet 1   gabapentin (NEURONTIN) 300 MG capsule 1  tab a.m., 3 tabs at bedtime (Patient taking differently: 1 tab a.m., 2 tabs at bedtime) 360 capsule 3   glipiZIDE (GLUCOTROL) 5 MG tablet Take 1 tablet (5 mg total) by mouth every morning before breakfast 90 tablet 3   isosorbide mononitrate (IMDUR) 30 MG ER tablet Take 1 tablet (30 mg total) by mouth once daily 90 tablet 3   lansoprazole (PREVACID) 30 MG DR capsule Take 1 capsule (30 mg total) by mouth once daily 90 capsule 3   latanoprost (XALATAN) 0.005 % ophthalmic solution INSTILL 1 DROP INTO LEFT EYE EVERY NIGHT 7.5 mL 1   meloxicam (MOBIC) 15 MG tablet Take 1 tablet (15 mg total) by mouth once daily as needed (Inflammation) (Patient taking differently: Take 15 mg by mouth once daily as needed (Inflammation) TAKES DAILY) 90 tablet 3   methotrexate (RHEUMATREX) 2.5 MG tablet Take 8 tablets (20 mg total) by mouth every 7 (seven) days All on the same day 32 tablet 2   montelukast (SINGULAIR) 10 mg tablet TAKE 1 TABLET (10 MG TOTAL) BY MOUTH ONCE DAILY AS NEEDED (ALLERGY) 90 tablet 3   nitroGLYcerin (NITROSTAT) 0.4 MG SL tablet Place 1 tablet (0.4 mg total) under the tongue every 5 (five) minutes as needed for Chest pain May take up to 3 doses. 25 tablet 0   phentermine (ADIPEX-P) 15 MG capsule Take 1 capsule (15 mg total) by mouth every other day for 180 days 15 capsule 5   rosuvastatin (CRESTOR) 20 MG tablet Take 2 tablets (40 mg total) by mouth once daily 90 tablet 3   semaglutide (OZEMPIC) 0.25 mg or 0.5 mg (2 mg/3 mL) pen injector Inject 0.75 mLs (0.5 mg total) subcutaneously once a week 3 mL 5   tadalafiL (CIALIS) 20 MG tablet Take 1 tablet (20 mg total) by mouth once daily as needed for Erectile Dysfunction for up to 160 days 10 tablet 5   zolpidem (AMBIEN) 10 mg tablet TAKE 1 TABLET BY MOUTH EVERY DAY AT NIGHT 90 tablet 1   No current facility-administered medications for this visit.    ALLERGIES: Grass pollen, Other, Tree and shrub pollen, Cat dander, Dog dander, and Plaquenil  [hydroxychloroquine]  PAST MEDICAL HISTORY: Past Medical History:  Diagnosis Date   Allergy    Grass, dog hair, pollen   Arthritis 2015   Nothing   Asthma without status asthmaticus (HHS-HCC) Childhood   No episodes for several years.   Cancer (CMS/HHS-HCC) Last 6 years   Skin Cancers   Cataract cortical, senile 2017   Right eye cat was removed   DM type 2 with diabetic mixed hyperlipidemia  (CMS/HHS-HCC) 04/08/2018   GERD (gastroesophageal reflux disease) 04/09/2014   Heart disease    Hx of vertigo    Hyperlipidemia 04/09/2014   Hypertension 10 years   Controlled by medication.   Myocardial infarction (CMS/HHS-HCC) 04/10/2016   OSA (obstructive  sleep apnea) 04/09/2014    PAST SURGICAL HISTORY: Past Surgical History:  Procedure Laterality Date   CATARACT EXTRACTION Right 04/05/2016   CORONARY ANGIOPLASTY  04/10/2016   Successful   COLONOSCOPY  09/11/2018   Tubular adenoma of the colon/Repeat 89yrs/[facility]   skin cancer removal of face  2023   Colon @ [facility]  10/31/2022   Tubular adenomas/PHx CP/Repeat 65yrs/TKT   cardiac cath     2022     FAMILY HISTORY: Family History  Problem Relation Name Age of Onset   Arthritis Mother Marland Mcalpine Hanratty    Alzheimer's disease Mother Marland Mcalpine Liuzzi    Coronary Artery Disease (Blocked arteries around heart) Mother Marland Mcalpine Wanner    High blood pressure (Hypertension) Mother Marland Mcalpine Lucia    Skin cancer Mother Marland Mcalpine Dunckel    Stroke Mother Marland Mcalpine Wavra    Macular degeneration Mother Marland Mcalpine Brann    Arthritis Father Aggie Moats Mcclurkin    Coronary Artery Disease (Blocked arteries around heart) Father Aggie Moats Hunsucker    High blood pressure (Hypertension) Father Aggie Moats Grieser    High blood pressure (Hypertension) Brother Tim Golomb    Cancer Maternal Aunt     Arthritis Maternal Grandmother     Arthritis Maternal Grandfather     Cancer Maternal Grandfather     Arthritis Paternal Grandmother     Arthritis  Paternal Grandfather WB Weideman    High blood pressure (Hypertension) Paternal Grandfather WB Sylve    Glaucoma Neg Hx       SOCIAL HISTORY: Social History   Socioeconomic History   Marital status: Married  Tobacco Use   Smoking status: Never    Passive exposure: Never   Smokeless tobacco: Never  Vaping Use   Vaping status: Never Used  Substance and Sexual Activity   Alcohol use: Yes    Comment: One drink every month or two. Very seldom.   Drug use: No   Sexual activity: Yes    Partners: Female    Birth control/protection: Surgical, None  Social History Narrative   Pt lives with his wife, Agustin Cree. Pt is a Radio producer.     PHYSICAL EXAM: Vitals:   08/27/23 1303  BP: (!) 150/82   Body mass index is 38.91 kg/m. Weight: (!) 126.6 kg (279 lb)   GENERAL: Alert, active, oriented x3  HEENT: Pupils equal reactive to light. Extraocular movements are intact. Sclera clear. Palpebral conjunctiva normal red color.Pharynx clear.  NECK: Supple with no palpable mass and no adenopathy.  LUNGS: Sound clear with no rales rhonchi or wheezes.  HEART: Regular rhythm S1 and S2 without murmur.  ABDOMEN: Soft and depressible, but tender to palpation in the right upper quadrant.   EXTREMITIES: Well-developed well-nourished symmetrical with no dependent edema.  NEUROLOGICAL: Awake alert oriented, facial expression symmetrical, moving all extremities.  REVIEW OF DATA: I have reviewed the following data today: Office Visit on 08/27/2023  Component Date Value   WBC (White Blood Cell Co* 08/27/2023 11.0 (H)    RBC (Red Blood Cell Coun* 08/27/2023 4.38 (L)    Hemoglobin 08/27/2023 13.1 (L)    Hematocrit 08/27/2023 40.6    MCV (Mean Corpuscular Vo* 08/27/2023 92.7    MCH (Mean Corpuscular He* 08/27/2023 29.9    MCHC (Mean Corpuscular H* 08/27/2023 32.3    Platelet Count 08/27/2023 186    RDW-CV (Red Cell Distrib* 08/27/2023 16.2 (H)    MPV (Mean Platelet Volum* 08/27/2023 10.5     Neutrophils 08/27/2023  6.82    Lymphocytes 08/27/2023 2.37    Monocytes 08/27/2023 1.05    Eosinophils 08/27/2023 0.65 (H)    Basophils 08/27/2023 0.11 (H)    Neutrophil % 08/27/2023 61.7    Lymphocyte % 08/27/2023 21.5    Monocyte % 08/27/2023 9.5    Eosinophil % 08/27/2023 5.9 (H)    Basophil% 08/27/2023 1.0    Immature Granulocyte % 08/27/2023 0.4    Immature Granulocyte Cou* 08/27/2023 0.04    Glucose 08/27/2023 109    Sodium 08/27/2023 141    Potassium 08/27/2023 5.0    Chloride 08/27/2023 105    Carbon Dioxide (CO2) 08/27/2023 29.2    Urea Nitrogen (BUN) 08/27/2023 12    Creatinine 08/27/2023 0.9    Glomerular Filtration Ra* 08/27/2023 91    Calcium 08/27/2023 9.3    AST  08/27/2023 24    ALT  08/27/2023 29    Alk Phos (alkaline Phosp* 08/27/2023 101    Albumin 08/27/2023 3.9    Bilirubin, Total 08/27/2023 1.5 (H)    Protein, Total 08/27/2023 7.1    A/G Ratio 08/27/2023 1.2   Office Visit on 07/23/2023  Component Date Value   ALT  07/23/2023 27    Albumin 07/23/2023 3.9    AST  07/23/2023 24    WBC (White Blood Cell Co* 07/23/2023 7.6    RBC (Red Blood Cell Coun* 07/23/2023 4.33 (L)    Hemoglobin 07/23/2023 12.8 (L)    Hematocrit 07/23/2023 38.9 (L)    MCV (Mean Corpuscular Vo* 07/23/2023 89.8    MCH (Mean Corpuscular He* 07/23/2023 29.6    MCHC (Mean Corpuscular H* 07/23/2023 32.9    Platelet Count 07/23/2023 161    RDW-CV (Red Cell Distrib* 07/23/2023 15.4 (H)    MPV (Mean Platelet Volum* 07/23/2023 10.9    Neutrophils 07/23/2023 3.77    Lymphocytes 07/23/2023 2.65    Monocytes 07/23/2023 0.56    Eosinophils 07/23/2023 0.52    Basophils 07/23/2023 0.07    Neutrophil % 07/23/2023 49.6    Lymphocyte % 07/23/2023 34.9    Monocyte % 07/23/2023 7.4    Eosinophil % 07/23/2023 6.8 (H)    Basophil% 07/23/2023 0.9    Immature Granulocyte % 07/23/2023 0.4    Immature Granulocyte Cou* 07/23/2023 0.03    Creatinine 07/23/2023 0.9    Glomerular Filtration Ra*  07/23/2023 91    Sedimentation Rate-Autom* 07/23/2023 17    C Reactive Protein - Lab* 07/23/2023 2    QuantiFERON Incubation -* 07/23/2023 Incubation performed.    QuantiFERON Criteria - L* 07/23/2023 Comment    QuantiFERON TB1 Ag Value* 07/23/2023 0.01    QuantiFERON TB2 Ag Value* 07/23/2023 0.01    QuantiFERON Nil Value - * 07/23/2023 0.01    QuantiFERON Mitogen Valu* 07/23/2023 >10.00    QuantiFERON TB Gold - La* 07/23/2023 Negative   Appointment on 06/04/2023  Component Date Value   WBC (White Blood Cell Co* 06/04/2023 7.8    RBC (Red Blood Cell Coun* 06/04/2023 4.54 (L)    Hemoglobin 06/04/2023 13.4 (L)    Hematocrit 06/04/2023 40.9    MCV (Mean Corpuscular Vo* 06/04/2023 90.1    MCH (Mean Corpuscular He* 06/04/2023 29.5    MCHC (Mean Corpuscular H* 06/04/2023 32.8    Platelet Count 06/04/2023 162    RDW-CV (Red Cell Distrib* 06/04/2023 14.6    MPV (Mean Platelet Volum* 06/04/2023 12.3    Neutrophils 06/04/2023 3.47    Lymphocytes 06/04/2023 2.92    Monocytes 06/04/2023 0.61    Eosinophils 06/04/2023  0.71 (H)    Basophils 06/04/2023 0.11 (H)    Neutrophil % 06/04/2023 44.3    Lymphocyte % 06/04/2023 37.3    Monocyte % 06/04/2023 7.8    Eosinophil % 06/04/2023 9.1 (H)    Basophil% 06/04/2023 1.4    Immature Granulocyte % 06/04/2023 0.1    Immature Granulocyte Cou* 06/04/2023 0.01    Glucose 06/04/2023 106    Sodium 06/04/2023 141    Potassium 06/04/2023 4.3    Chloride 06/04/2023 106    Carbon Dioxide (CO2) 06/04/2023 29.0    Urea Nitrogen (BUN) 06/04/2023 15    Creatinine 06/04/2023 1.0    Glomerular Filtration Ra* 06/04/2023 80    Calcium 06/04/2023 8.8    AST  06/04/2023 18    ALT  06/04/2023 13    Alk Phos (alkaline Phosp* 06/04/2023 64    Albumin 06/04/2023 3.9    Bilirubin, Total 06/04/2023 0.9    Protein, Total 06/04/2023 6.2    A/G Ratio 06/04/2023 1.7    Hemoglobin A1C 06/04/2023 6.6 (H)    Average Blood Glucose (C* 06/04/2023 143    Cholesterol, Total  06/04/2023 96 (L)    Triglyceride 06/04/2023 119    HDL (High Density Lipopr* 16/08/9603 41.7    LDL Calculated 06/04/2023 31    VLDL Cholesterol 06/04/2023 24    Cholesterol/HDL Ratio 06/04/2023 2.3    Thyroid Stimulating Horm* 06/04/2023 3.498      ASSESSMENT: Mr. Robinette is a 72 y.o. male presenting for consultation for clinical finding concerning for cholecystitis, possible choledocholithiasis.  Patient was oriented about the diagnosis of acute cholecystitis. Also oriented about what is the gallbladder, its anatomy and function and the implications of having stones. The patient was oriented about the treatment alternatives and due to elevated bilirubin today we discussed about cholecystectomy with cholangiogram. Patient was oriented that a low percentage of patient will continue to have similar pain symptoms even after the gallbladder is removed. Surgical technique (open vs laparoscopic) was discussed. It was also discussed the goals of the surgery (decrease the pain episodes and rule out choledocholithiasis) and the risk of surgery including: bleeding, infection, common bile duct injury, stone retention, injury to other organs such as bowel, liver, stomach, other complications such as hernia, bowel obstruction among others. Also discussed with patient about anesthesia and its complications such as: reaction to medications, pneumonia, heart complications, death, among others.   Cholecystitis, acute [K81.0]  PLAN: 1.  Will proceed with urgent robotic assisted laparoscopic cholecystectomy with cholangiogram (54098) 2.  CBC, CMP done 3. Contact us if has any question or concern.   Patient and his wife verbalized understanding, all questions were answered, and were agreeable with the plan outlined above.     Carolan Shiver, MD  Electronically signed by Carolan Shiver, MD

## 2023-08-27 NOTE — Anesthesia Preprocedure Evaluation (Signed)
Anesthesia Evaluation  Patient identified by MRN, date of birth, ID band Patient awake    Reviewed: Allergy & Precautions, H&P , NPO status , Patient's Chart, lab work & pertinent test results, reviewed documented beta blocker date and time   History of Anesthesia Complications Negative for: history of anesthetic complications  Airway Mallampati: III  TM Distance: >3 FB Neck ROM: full    Dental  (+) Chipped, Dental Advidsory Given   Pulmonary shortness of breath, with exertion and lying, asthma , sleep apnea and Continuous Positive Airway Pressure Ventilation , neg COPD, neg recent URI   Pulmonary exam normal        Cardiovascular Exercise Tolerance: Good hypertension, (-) angina + CAD, + Past MI and + Cardiac Stents  (-) CABG Normal cardiovascular exam(-) dysrhythmias (-) Valvular Problems/Murmurs     Neuro/Psych   Anxiety     negative neurological ROS  negative psych ROS   GI/Hepatic Neg liver ROS,GERD  Medicated,,  Endo/Other  negative endocrine ROSdiabetes, Well Controlled    Renal/GU      Musculoskeletal   Abdominal   Peds  Hematology negative hematology ROS (+)   Anesthesia Other Findings Past Medical History: No date: Arthritis     Comment:  a. Hands No date: Asthma     Comment:  a. had as a child. pollen could aggrevate but does not               use inhalers No date: CAD (coronary artery disease)     Comment:  a. 2014 c/p w/ reportedly nl cardiac w/u;  b. 03/2016 Ant              STEMI c/b VT/VF requiring multiple defibs-->Cath/PCI: LM               nl, LAD 99p w/ heavy thrombus (3.5 x 15 Xience Alpine               DES), D1 50, LCX min irregs throughout, RCA min irregs               throughout, EF 45-50% No date: Cardiac arrest with ventricular fibrillation (HCC)     Comment:  a. 04/10/2016 VT/VF arrest x 2 in setting of anterior ST               elevation MI requiring multiple defibs, chest                compressions, amio, epi. No date: Cataract     Comment:  a. 03/2016 s/p R cataract extraction and IOL placement. No date: Diet-controlled diabetes mellitus (HCC) No date: Essential hypertension No date: GERD (gastroesophageal reflux disease) No date: Hemorrhoids 07/25/2007: History of basal cell carcinoma (BCC)     Comment:  left nasal root/glabella area No date: HOH (hard of hearing) No date: Hyperlipidemia No date: Ischemic cardiomyopathy     Comment:  a. 03/2016 LV Gram: EF 45-50%;  b. 03/2014 Echo: EF               50-55%, apical AK, anteroapical HK, mild MR. No date: Morbid obesity (HCC) No date: Obstructive sleep apnea     Comment:  a. Compliant w/ CPAP. No date: Orthopnea     Comment:  a. Improved with CPAP. No date: Skin cancer     Comment:  a. Basal cell carcinoma of neck s/p resection. 04/10/2016: ST elevation myocardial infarction (STEMI) of anterior  wall (HCC) No date: Wears hearing aid in both ears  Past Surgical History: 04/10/2016: CARDIAC CATHETERIZATION; N/A     Comment:  Procedure: Left Heart Cath and Coronary Angiography;                Surgeon: Iran Ouch, MD;  Location: [facility]               CV LAB;  Service: Cardiovascular;  Laterality: N/A; 04/10/2016: CARDIAC CATHETERIZATION; N/A     Comment:  Procedure: Coronary Stent Intervention;  Surgeon:               Iran Ouch, MD;  Location: [facility] CV LAB;                Service: Cardiovascular;  Laterality: N/A; 04/03/2016: CATARACT EXTRACTION W/PHACO; Right     Comment:  Procedure: CATARACT EXTRACTION PHACO AND INTRAOCULAR               LENS PLACEMENT (IOC);  Surgeon: Sallee Lange, MD;                Location: [facility] ORS;  Service: Ophthalmology;  Laterality:              Right;  [place] 01:13 AP% 25.8 CDE 34.18 fluid pack lot               #[id number] H 06/18/2018: CATARACT EXTRACTION W/PHACO; Left     Comment:  Procedure: CATARACT EXTRACTION PHACO AND INTRAOCULAR               LENS  PLACEMENT (IOC) LEFT DIABETES IVA TOPICAL;  Surgeon:              Lockie Mola, MD;  Location: [facility];              Service: Ophthalmology;  Laterality: Left;  Diabetic -               diet controlled 2014: COLONOSCOPY 09/11/2018: COLONOSCOPY WITH PROPOFOL; N/A     Comment:  Procedure: COLONOSCOPY WITH PROPOFOL;  Surgeon: [place],               Boykin Nearing, MD;  Location: [facility] ENDOS[facility];  Service:               Gastroenterology;  Laterality: N/A; No date: CORONARY ANGIOPLASTY 09/02/2021: CORONARY PRESSURE/FFR STUDY; N/A     Comment:  Procedure: INTRAVASCULAR PRESSURE WIRE/FFR STUDY;                Surgeon: Iran Ouch, MD;  Location: [facility]               CV LAB;  Service: Cardiovascular;  Laterality: N/A; No date: EYE SURGERY 09/02/2021: LEFT HEART CATH AND CORONARY ANGIOGRAPHY; Left     Comment:  Procedure: LEFT HEART CATH AND CORONARY ANGIOGRAPHY;                Surgeon: Iran Ouch, MD;  Location: [facility]               CV LAB;  Service: Cardiovascular;  Laterality: Left; No date: skin cancers     Comment:  head  BMI    Body Mass Index: 39.75 kg/m      Reproductive/Obstetrics negative OB ROS                             Anesthesia Physical Anesthesia Plan  ASA: 3  Anesthesia Plan: General ETT and General   Post-op Pain  Management:    Induction: Intravenous  PONV Risk Score and Plan: 2 and Ondansetron, Dexamethasone and Midazolam  Airway Management Planned: Oral ETT  Additional Equipment:   Intra-op Plan:   Post-operative Plan: Extubation in OR  Informed Consent: I have reviewed the patients History and Physical, chart, labs and discussed the procedure including the risks, benefits and alternatives for the proposed anesthesia with the patient or authorized representative who has indicated his/her understanding and acceptance.     Dental Advisory Given  Plan Discussed with: Anesthesiologist, CRNA  and Surgeon  Anesthesia Plan Comments: (Patient consented for risks of anesthesia including but not limited to:  - adverse reactions to medications - damage to eyes, teeth, lips or other oral mucosa - nerve damage due to positioning  - sore throat or hoarseness - Damage to heart, brain, nerves, lungs, other parts of body or loss of life  Patient voiced understanding.)       Anesthesia Quick Evaluation

## 2023-08-28 DIAGNOSIS — K810 Acute cholecystitis: Secondary | ICD-10-CM | POA: Diagnosis not present

## 2023-08-28 LAB — BASIC METABOLIC PANEL
Anion gap: 9 (ref 5–15)
BUN: 15 mg/dL (ref 8–23)
CO2: 24 mmol/L (ref 22–32)
Calcium: 8 mg/dL — ABNORMAL LOW (ref 8.9–10.3)
Chloride: 105 mmol/L (ref 98–111)
Creatinine, Ser: 0.72 mg/dL (ref 0.61–1.24)
GFR, Estimated: >60 mL/min (ref >60)
Glucose, Bld: 159 mg/dL — ABNORMAL HIGH (ref 70–99)
Potassium: 4.6 mmol/L (ref 3.5–5.1)
Sodium: 138 mmol/L (ref 135–145)

## 2023-08-28 LAB — CBC
HCT: 36.4 % — ABNORMAL LOW (ref 39.0–52.0)
Hemoglobin: 11.9 g/dL — ABNORMAL LOW (ref 13.0–17.0)
MCH: 29.5 pg (ref 26.0–34.0)
MCHC: 32.7 g/dL (ref 30.0–36.0)
MCV: 90.1 fL (ref 80.0–100.0)
Platelets: 182 10*3/uL (ref 150–400)
RBC: 4.04 MIL/uL — ABNORMAL LOW (ref 4.22–5.81)
RDW: 15.9 % — ABNORMAL HIGH (ref 11.5–15.5)
WBC: 11.2 10*3/uL — ABNORMAL HIGH (ref 4.0–10.5)
nRBC: 0 % (ref 0.0–0.2)

## 2023-08-28 LAB — HEPATIC FUNCTION PANEL
ALT: 34 U/L (ref 0–44)
AST: 44 U/L — ABNORMAL HIGH (ref 15–41)
Albumin: 3.1 g/dL — ABNORMAL LOW (ref 3.5–5.0)
Alkaline Phosphatase: 82 U/L (ref 38–126)
Bilirubin, Direct: 0.2 mg/dL (ref 0.0–0.2)
Indirect Bilirubin: 0.9 mg/dL (ref 0.3–0.9)
Total Bilirubin: 1.1 mg/dL (ref 0.3–1.2)
Total Protein: 6.5 g/dL (ref 6.5–8.1)

## 2023-08-28 NOTE — Plan of Care (Signed)
  Problem: Education: Goal: Knowledge of General Education information will improve Description: Including pain rating scale, medication(s)/side effects and non-pharmacologic comfort measures Outcome: Adequate for Discharge   Problem: Health Behavior/Discharge Planning: Goal: Ability to manage health-related needs will improve Outcome: Adequate for Discharge   Problem: Clinical Measurements: Goal: Ability to maintain clinical measurements within normal limits will improve Outcome: Adequate for Discharge Goal: Will remain free from infection Outcome: Adequate for Discharge Goal: Diagnostic test results will improve Outcome: Adequate for Discharge Goal: Respiratory complications will improve Outcome: Adequate for Discharge Goal: Cardiovascular complication will be avoided Outcome: Adequate for Discharge   Problem: Activity: Goal: Risk for activity intolerance will decrease Outcome: Adequate for Discharge   Problem: Nutrition: Goal: Adequate nutrition will be maintained Outcome: Adequate for Discharge   Problem: Coping: Goal: Level of anxiety will decrease Outcome: Adequate for Discharge   Problem: Elimination: Goal: Will not experience complications related to bowel motility Outcome: Adequate for Discharge Goal: Will not experience complications related to urinary retention Outcome: Adequate for Discharge   Problem: Pain Managment: Goal: General experience of comfort will improve Outcome: Adequate for Discharge   Problem: Safety: Goal: Ability to remain free from injury will improve Outcome: Adequate for Discharge   Problem: Skin Integrity: Goal: Risk for impaired skin integrity will decrease Outcome: Adequate for Discharge    Pt ao x4, respirations even and unlabored on RA. Pt has all belongings. Pt has received all DC instructions. Pt being taken home by wife. Pt taken down to lobby with staff via wheelchair

## 2023-08-28 NOTE — TOC CM/SW Note (Signed)
Transition of Care [facility]) - Inpatient Brief Assessment   Patient Details  Name: Bobby Tanner MRN: [id number] Date of Birth: 05-13-1951  Transition of Care [facility]) CM/SW Contact:    Chapman Fitch, RN Phone Number: 08/28/2023, 8:52 AM   Clinical Narrative:   Transition of Care (TOC) Screening Note   Patient Details  Name: Bobby Tanner Date of Birth: 1951/06/03   Transition of Care [facility]) CM/SW Contact:    Chapman Fitch, RN Phone Number: 08/28/2023, 8:52 AM    Transition of Care Department [facility]) has reviewed patient and no TOC needs have been identified at this time. We will continue to monitor patient advancement through interdisciplinary progression rounds. If new patient transition needs arise, please place a TOC consult.    Transition of Care Asessment: Insurance and Status: Insurance coverage has been reviewed Patient has primary care physician: Yes     Prior/Current Home Services: No current home services Social Determinants of Health Reivew: SDOH reviewed no interventions necessary Readmission risk has been reviewed: Yes Transition of care needs: no transition of care needs at this time

## 2023-08-28 NOTE — Discharge Summary (Signed)
Patient ID: DONTARIUS DEVALL MRN: [id number] DOB/AGE: [birth year] 72 y.o.  Admit date: 08/27/2023 Discharge date: 08/28/2023   Discharge Diagnoses:  Principal Problem:   Acute cholecystitis   Procedures: Robotic assisted laparoscopic cholecystectomy with intraoperative cholangiogram  Hospital Course: Patient admitted with acute cholecystitis and concern of choledocholithiasis due to elevated bilirubin of 1.5.  He underwent robotic cholecystectomy with intraoperative cholangiogram.  Intraoperative cholangiogram negative for filling defect in the common bile duct.  Patient tolerated the procedure well.  This morning patient with pain control.  Patient tolerating diet. Bilirubin decreased to 1.1.  With decreasing bilirubin and negative cholangiogram, pain control, tolerating diet, wounds healing well patient is ready for discharge.  Physical Exam Vitals reviewed.  HENT:     Head: Normocephalic.  Cardiovascular:     Rate and Rhythm: Normal rate and regular rhythm.  Pulmonary:     Effort: Pulmonary effort is normal.     Breath sounds: Normal breath sounds.  Abdominal:     General: Abdomen is flat. There is no distension.     Palpations: Abdomen is soft.     Tenderness: There is no abdominal tenderness.  Musculoskeletal:     Cervical back: Normal range of motion.  Skin:    Capillary Refill: Capillary refill takes less than 2 seconds.  Neurological:     General: No focal deficit present.     Mental Status: He is alert and oriented to person, place, and time.      Consults: None  Disposition: Discharge disposition: 01-Home or Self Care       Discharge Instructions     Diet - low sodium heart healthy   Complete by: As directed    Increase activity slowly   Complete by: As directed       Allergies as of 08/28/2023       Reactions   Pollen Extract Itching, Shortness Of Breath   Dog Epithelium (canis Lupus Familiaris) Other (See Comments)        Medication List      TAKE these medications    aspirin EC 81 MG tablet Take 81 mg by mouth in the morning.   azelastine 0.1 % nasal spray Commonly known as: ASTELIN Place 2 sprays into the nose at bedtime as needed for allergies.   ezetimibe 10 MG tablet Commonly known as: ZETIA TAKE 1 TABLET BY MOUTH EVERY DAY   Fish Oil 1000 MG Caps Take by mouth daily in the afternoon.   furosemide 20 MG tablet Commonly known as: LASIX TAKE 1 TABLET BY MOUTH DAILY AS NEEDED   gabapentin 300 MG capsule Commonly known as: NEURONTIN Take 300 mg by mouth 3 (three) times daily.   glipiZIDE 5 MG tablet Commonly known as: GLUCOTROL Take 5 mg by mouth daily before breakfast.   isosorbide mononitrate 30 MG 24 hr tablet Commonly known as: IMDUR Take 30 mg by mouth in the morning.   lansoprazole 30 MG capsule Commonly known as: PREVACID Take 30 mg by mouth in the morning.   leflunomide 20 MG tablet Commonly known as: ARAVA Take 20 mg by mouth daily.   meloxicam 15 MG tablet Commonly known as: MOBIC Take 15 mg by mouth as needed.   metoCLOPramide 10 MG tablet Commonly known as: REGLAN Take 1 tablet (10 mg total) by mouth every 6 (six) hours as needed.   montelukast 10 MG tablet Commonly known as: SINGULAIR Take 10 mg by mouth at bedtime.   multivitamin capsule Take 1 capsule by mouth daily.  oxyCODONE-acetaminophen 5-325 MG tablet Commonly known as: Percocet Take 1 tablet by mouth every 6 (six) hours as needed for severe pain.   rosuvastatin 20 MG tablet Commonly known as: CRESTOR Take 1 tablet (20 mg total) by mouth daily. Overdue follow up.  PLEASE CALL OFFICE TO SCHEDULE APPOINTMENT PRIOR TO NEXT REFILL   Theratears 0.25 % Soln Generic drug: Carboxymethylcellulose Sodium Place 1-2 drops into both eyes 3 (three) times daily as needed (dry/irritated eyes).   Vitamin B-12 5000 MCG Subl Take 5,000 mcg by mouth in the morning.   zolpidem 10 MG tablet Commonly known as: AMBIEN Take 10 mg  by mouth at bedtime.        Follow-up Information     Carolan Shiver, MD Follow up in 2 week(s).   Specialty: General Surgery Why: Follow up after cholecystectomy Contact information: [address] [place] [address] [phone]

## 2023-08-28 NOTE — Plan of Care (Signed)

## 2023-08-29 LAB — SURGICAL PATHOLOGY

## 2023-11-17 ENCOUNTER — Other Ambulatory Visit: Payer: Self-pay | Admitting: Cardiovascular Disease

## 2023-12-04 DIAGNOSIS — L97521 Non-pressure chronic ulcer of other part of left foot limited to breakdown of skin: Secondary | ICD-10-CM | POA: Diagnosis not present

## 2023-12-04 DIAGNOSIS — E1142 Type 2 diabetes mellitus with diabetic polyneuropathy: Secondary | ICD-10-CM | POA: Diagnosis not present

## 2023-12-05 DIAGNOSIS — K219 Gastro-esophageal reflux disease without esophagitis: Secondary | ICD-10-CM | POA: Diagnosis not present

## 2023-12-05 DIAGNOSIS — I2510 Atherosclerotic heart disease of native coronary artery without angina pectoris: Secondary | ICD-10-CM | POA: Diagnosis not present

## 2023-12-05 DIAGNOSIS — Z125 Encounter for screening for malignant neoplasm of prostate: Secondary | ICD-10-CM | POA: Diagnosis not present

## 2023-12-05 DIAGNOSIS — E1165 Type 2 diabetes mellitus with hyperglycemia: Secondary | ICD-10-CM | POA: Diagnosis not present

## 2023-12-05 DIAGNOSIS — I10 Essential (primary) hypertension: Secondary | ICD-10-CM | POA: Diagnosis not present

## 2023-12-05 DIAGNOSIS — E1151 Type 2 diabetes mellitus with diabetic peripheral angiopathy without gangrene: Secondary | ICD-10-CM | POA: Diagnosis not present

## 2023-12-05 DIAGNOSIS — Z9049 Acquired absence of other specified parts of digestive tract: Secondary | ICD-10-CM | POA: Diagnosis not present

## 2023-12-05 DIAGNOSIS — K5904 Chronic idiopathic constipation: Secondary | ICD-10-CM | POA: Diagnosis not present

## 2023-12-05 DIAGNOSIS — D369 Benign neoplasm, unspecified site: Secondary | ICD-10-CM | POA: Diagnosis not present

## 2023-12-05 DIAGNOSIS — D1803 Hemangioma of intra-abdominal structures: Secondary | ICD-10-CM | POA: Diagnosis not present

## 2023-12-06 DIAGNOSIS — M48062 Spinal stenosis, lumbar region with neurogenic claudication: Secondary | ICD-10-CM | POA: Diagnosis not present

## 2023-12-06 DIAGNOSIS — M5126 Other intervertebral disc displacement, lumbar region: Secondary | ICD-10-CM | POA: Diagnosis not present

## 2023-12-06 DIAGNOSIS — M5416 Radiculopathy, lumbar region: Secondary | ICD-10-CM | POA: Diagnosis not present

## 2023-12-07 ENCOUNTER — Other Ambulatory Visit: Payer: Self-pay | Admitting: Physical Medicine and Rehabilitation

## 2023-12-07 DIAGNOSIS — M5416 Radiculopathy, lumbar region: Secondary | ICD-10-CM

## 2023-12-12 ENCOUNTER — Ambulatory Visit
Admission: RE | Admit: 2023-12-12 | Discharge: 2023-12-12 | Disposition: A | Discharge status: Home / Self Care | Payer: Self-pay | Source: Ambulatory Visit | Attending: Physical Medicine and Rehabilitation | Admitting: Physical Medicine and Rehabilitation

## 2023-12-12 DIAGNOSIS — Z0000 Encounter for general adult medical examination without abnormal findings: Secondary | ICD-10-CM | POA: Diagnosis not present

## 2023-12-12 DIAGNOSIS — G4733 Obstructive sleep apnea (adult) (pediatric): Secondary | ICD-10-CM | POA: Diagnosis not present

## 2023-12-12 DIAGNOSIS — F330 Major depressive disorder, recurrent, mild: Secondary | ICD-10-CM | POA: Diagnosis not present

## 2023-12-12 DIAGNOSIS — M5126 Other intervertebral disc displacement, lumbar region: Secondary | ICD-10-CM | POA: Diagnosis not present

## 2023-12-12 DIAGNOSIS — M48061 Spinal stenosis, lumbar region without neurogenic claudication: Secondary | ICD-10-CM | POA: Diagnosis not present

## 2023-12-12 DIAGNOSIS — E1151 Type 2 diabetes mellitus with diabetic peripheral angiopathy without gangrene: Secondary | ICD-10-CM | POA: Diagnosis not present

## 2023-12-12 DIAGNOSIS — M5416 Radiculopathy, lumbar region: Secondary | ICD-10-CM

## 2023-12-12 DIAGNOSIS — K5904 Chronic idiopathic constipation: Secondary | ICD-10-CM | POA: Diagnosis not present

## 2023-12-12 DIAGNOSIS — Z6841 Body Mass Index (BMI) 40.0 and over, adult: Secondary | ICD-10-CM | POA: Diagnosis not present

## 2023-12-12 DIAGNOSIS — M0609 Rheumatoid arthritis without rheumatoid factor, multiple sites: Secondary | ICD-10-CM | POA: Diagnosis not present

## 2023-12-12 DIAGNOSIS — E66813 Obesity, class 3: Secondary | ICD-10-CM | POA: Diagnosis not present

## 2023-12-13 ENCOUNTER — Encounter: Payer: Self-pay | Admitting: Cardiovascular Disease

## 2023-12-13 ENCOUNTER — Ambulatory Visit: Payer: PPO | Attending: Cardiovascular Disease | Admitting: Cardiovascular Disease

## 2023-12-13 VITALS — BP 134/72 | HR 66 | Ht 71.0 in | Wt 271.4 lb

## 2023-12-13 DIAGNOSIS — I350 Nonrheumatic aortic (valve) stenosis: Secondary | ICD-10-CM | POA: Diagnosis not present

## 2023-12-13 DIAGNOSIS — I2510 Atherosclerotic heart disease of native coronary artery without angina pectoris: Secondary | ICD-10-CM | POA: Diagnosis not present

## 2023-12-13 DIAGNOSIS — I10 Essential (primary) hypertension: Secondary | ICD-10-CM

## 2023-12-13 DIAGNOSIS — E785 Hyperlipidemia, unspecified: Secondary | ICD-10-CM

## 2023-12-13 MED ORDER — ROSUVASTATIN CALCIUM 20 MG PO TABS: 20.0000 mg | ORAL_TABLET | Freq: Every day | ORAL | 3 refills | Status: DC

## 2023-12-13 NOTE — Patient Instructions (Signed)
Medication Instructions:  STOP the Ezetimibe (Zetia)  *If you need a refill on your cardiac medications before your next appointment, please call your pharmacy*   Lab Work: None ordered If you have labs (blood work) drawn today and your tests are completely normal, you will receive your results only by: MyChart Message (if you have MyChart) OR A paper copy in the mail If you have any lab test that is abnormal or we need to change your treatment, we will call you to review the results.   Testing/Procedures: None ordered   Follow-Up: At [facility], you and your health needs are our priority.  As part of our continuing mission to provide you with exceptional heart care, we have created designated Provider Care Teams.  These Care Teams include your primary Cardiologist (physician) and Advanced Practice Providers (APPs -  Physician Assistants and Nurse Practitioners) who all work together to provide you with the care you need, when you need it.  We recommend signing up for the patient portal called "MyChart".  Sign up information is provided on this After Visit Summary.  MyChart is used to connect with patients for Virtual Visits (Telemedicine).  Patients are able to view lab/test results, encounter notes, upcoming appointments, etc.  Non-urgent messages can be sent to your provider as well.   To learn more about what you can do with MyChart, go to [email or web address].    Your next appointment:   12 month(s)  Provider:   You may see Lorine Bears, MD or one of the following Advanced Practice Providers on your designated Care Team:   Nicolasa Ducking, NP Eula Listen, PA-C Cadence Fransico Michael, PA-C Charlsie Quest, NP Carlos Levering, NP

## 2023-12-13 NOTE — Addendum Note (Signed)
Addended by: Sandi Mariscal on: 12/13/2023 10:47 AM   Modules accepted: Orders

## 2023-12-13 NOTE — Progress Notes (Signed)
Cardiology Office Note   Date:  12/13/2023   ID:  Bobby Tanner, DOB 11/21/1951, MRN [id number]  PCP:  Danella Penton, MD  Cardiologist:   Lorine Bears, MD   Chief Complaint  Patient presents with   Follow-up    6 month f/u no complaints today. Meds reviewed verbally with pt.      History of Present Illness: Bobby Tanner is a 73 y.o. male who presents for a follow-up visit regarding coronary artery disease.  He has known history of essential hypertension, inflammatory arthritis, sleep apnea on CPAP, diet-controlled diabetes, GERD, hyperlipidemia and obesity. He had anterior ST elevation myocardial infarction in May 2017 complicated by ventricular fibrillation which required multiple defibrillations.  Emergent cardiac catheterization showed 99% thrombotic stenosis in the proximal LAD.  He was treated with angioplasty and drug-eluting stent placement. Repeat cardiac catheterization in October 2022 showed patent LAD stent with no significant restenosis, stable moderate stenosis in the first diagonal, moderate proximal stenosis in the right coronary artery which was not significant by fractional flow reserve.  EF was normal with moderately elevated left ventricular end-diastolic pressure. 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  He has been doing well with no chest pain or shortness of breath.  He has been having issues with generalized fatigue and poor sleep at night.  He saw his primary care physician and gabapentin was decreased.  His sleep medication was also switched.   Past Medical History:  Diagnosis Date   Arthritis    a. Hands   Asthma    a. had as a child. pollen could aggrevate but does not use inhalers   CAD (coronary artery disease)    a. 2014 c/p w/ reportedly nl cardiac w/u;  b. 03/2016 Ant STEMI c/b VT/VF requiring multiple defibs-->Cath/PCI: LM nl, LAD 99p w/ heavy thrombus (3.5 x 15 Xience Alpine DES), D1 50, LCX min irregs throughout, RCA min irregs  throughout, EF 45-50%   Cardiac arrest with ventricular fibrillation (HCC)    a. 04/10/2016 VT/VF arrest x 2 in setting of anterior ST elevation MI requiring multiple defibs, chest compressions, amio, epi.   Cataract    a. 03/2016 s/p R cataract extraction and IOL placement.   Diet-controlled diabetes mellitus (HCC)    Essential hypertension    GERD (gastroesophageal reflux disease)    Hemorrhoids    History of basal cell carcinoma (BCC) 07/25/2007   left nasal root/glabella area   HOH (hard of hearing)    Hyperlipidemia    Ischemic cardiomyopathy    a. 03/2016 LV Gram: EF 45-50%;  b. 03/2014 Echo: EF 50-55%, apical AK, anteroapical HK, mild MR.   Morbid obesity (HCC)    Obstructive sleep apnea    a. Compliant w/ CPAP.   Orthopnea    a. Improved with CPAP.   Skin cancer    a. Basal cell carcinoma of neck s/p resection.   ST elevation myocardial infarction (STEMI) of anterior wall (HCC) 04/10/2016   Wears hearing aid in both ears     Past Surgical History:  Procedure Laterality Date   CARDIAC CATHETERIZATION N/A 04/10/2016   Procedure: Left Heart Cath and Coronary Angiography;  Surgeon: Iran Ouch, MD;  Location: [facility] INVASIVE CV LAB;  Service: Cardiovascular;  Laterality: N/A;   CARDIAC CATHETERIZATION N/A 04/10/2016   Procedure: Coronary Stent Intervention;  Surgeon: Iran Ouch, MD;  Location: [facility] INVASIVE CV LAB;  Service: Cardiovascular;  Laterality: N/A;   CATARACT EXTRACTION W/PHACO Right  04/03/2016   Procedure: CATARACT EXTRACTION PHACO AND INTRAOCULAR LENS PLACEMENT (IOC);  Surgeon: Sallee Lange, MD;  Location: [facility] ORS;  Service: Ophthalmology;  Laterality: Right;  [place] 01:13 AP% 25.8 CDE 34.18 fluid pack lot #[id number]   CATARACT EXTRACTION W/PHACO Left 06/18/2018   Procedure: CATARACT EXTRACTION PHACO AND INTRAOCULAR LENS PLACEMENT (IOC) LEFT DIABETES IVA TOPICAL;  Surgeon: Lockie Mola, MD;  Location: [facility];  Service: Ophthalmology;   Laterality: Left;  Diabetic - diet controlled   CHOLECYSTECTOMY     COLONOSCOPY  2014   COLONOSCOPY WITH PROPOFOL N/A 09/11/2018   Procedure: COLONOSCOPY WITH PROPOFOL;  Surgeon: Toledo, Boykin Nearing, MD;  Location: [facility] ENDOSCOPY;  Service: Gastroenterology;  Laterality: N/A;   CORONARY ANGIOPLASTY     CORONARY PRESSURE/FFR STUDY N/A 09/02/2021   Procedure: INTRAVASCULAR PRESSURE WIRE/FFR STUDY;  Surgeon: Iran Ouch, MD;  Location: [facility] INVASIVE CV LAB;  Service: Cardiovascular;  Laterality: N/A;   EYE SURGERY     INTRAOPERATIVE CHOLANGIOGRAM N/A 08/27/2023   Procedure: INTRAOPERATIVE CHOLANGIOGRAM;  Surgeon: Carolan Shiver, MD;  Location: [facility] ORS;  Service: General;  Laterality: N/A;   LEFT HEART CATH AND CORONARY ANGIOGRAPHY Left 09/02/2021   Procedure: LEFT HEART CATH AND CORONARY ANGIOGRAPHY;  Surgeon: Iran Ouch, MD;  Location: [facility] INVASIVE CV LAB;  Service: Cardiovascular;  Laterality: Left;   skin cancers     head     Current Outpatient Medications  Medication Sig Dispense Refill   aspirin EC 81 MG tablet Take 81 mg by mouth in the morning.     azelastine (ASTELIN) 0.1 % nasal spray Place 2 sprays into the nose at bedtime as needed for allergies.     ezetimibe (ZETIA) 10 MG tablet TAKE 1 TABLET BY MOUTH EVERY DAY 90 tablet 0   furosemide (LASIX) 20 MG tablet TAKE 1 TABLET BY MOUTH DAILY AS NEEDED 90 tablet 0   gabapentin (NEURONTIN) 300 MG capsule Take 300 mg by mouth 3 (three) times daily.     glipiZIDE (GLUCOTROL) 5 MG tablet Take 5 mg by mouth daily before breakfast.     isosorbide mononitrate (IMDUR) 30 MG 24 hr tablet Take 30 mg by mouth in the morning.     lansoprazole (PREVACID) 30 MG capsule Take 30 mg by mouth in the morning.     rosuvastatin (CRESTOR) 20 MG tablet Take 1 tablet (20 mg total) by mouth daily. Overdue follow up.  PLEASE CALL OFFICE TO SCHEDULE APPOINTMENT PRIOR TO NEXT REFILL 90 tablet 0   zolpidem (AMBIEN) 10 MG tablet Take 10 mg by  mouth at bedtime.     No current facility-administered medications for this visit.    Allergies:   Pollen extract, Cat dander, Dog epithelium (canis lupus familiaris), and Hydroxychloroquine    Social History:  The patient  reports that he has never smoked. He has never used smokeless tobacco. He reports current alcohol use. He reports that he does not use drugs.   Family History:  The patient's family history includes Alzheimer's disease in his mother; CAD in his father; Hypertension in his brother, father, and paternal grandfather.    ROS:  Please see the history of present illness.   Otherwise, review of systems are positive for none.   All other systems are reviewed and negative.    PHYSICAL EXAM: VS:  BP 134/72 (BP Location: Left Arm, Patient Position: Sitting, Cuff Size: Large)   Pulse 66   Ht 5\' 11"  (1.803 m)   Wt 271 lb 6 oz (  123.1 kg)   BMI 37.85 kg/m  , BMI Body mass index is 37.85 kg/m. GEN: Well nourished, well developed, in no acute distress  HEENT: normal  Neck: no JVD, carotid bruits, or masses Cardiac: RRR; no  rubs, or gallops, 2 out of 6 systolic murmur in the aortic area.  Mild bilateral leg edema Respiratory:  clear to auscultation bilaterally, normal work of breathing GI: soft, nontender, nondistended, + BS MS: no deformity or atrophy  Skin: warm and dry, no rash Neuro:  Strength and sensation are intact Psych: euthymic mood, full affect   EKG:  EKG is not ordered today.    Recent Labs: 08/28/2023: ALT 34; BUN 15; Creatinine, Ser 0.72; Hemoglobin 11.9; Platelets 182; Potassium 4.6; Sodium 138    Lipid Panel    Component Value Date/Time   CHOL 189 04/11/2016 0648   TRIG 100 04/11/2016 0648   HDL 37 (L) 04/11/2016 0648   CHOLHDL 5.1 04/11/2016 0648   VLDL 20 04/11/2016 0648   LDLCALC 132 (H) 04/11/2016 0648      Wt Readings from Last 3 Encounters:  12/13/23 271 lb 6 oz (123.1 kg)  08/27/23 285 lb (129.3 kg)  08/23/23 285 lb (129.3 kg)            No data to display            ASSESSMENT AND PLAN:  1.  Coronary artery disease involving native coronary arteries without angina: He is doing well overall with no recurrent angina.  Continue medical therapy.   2.  History of ischemic cardiomyopathy with improvement in ejection fraction to normal.  No symptoms of heart failure.  3.  Essential hypertension: Blood pressure is well-controlled on current medications.  4.  Hyperlipidemia: Recent lipid profile showed an LDL of 34 and triglyceride of 73.  I discontinued ezetimibe and will continue with rosuvastatin 20 mg daily.  5.  Aortic stenosis: This was mild on most recent echocardiogram in April of this year.  Will plan on repeat echocardiogram in April 2026.     Disposition:   FU in 12 months.  Signed,  Lorine Bears, MD  12/13/2023 10:33 AM    [facility]

## 2023-12-30 ENCOUNTER — Other Ambulatory Visit: Payer: Self-pay | Admitting: Cardiovascular Disease

## 2024-01-03 DIAGNOSIS — Z7960 Long term (current) use of unspecified immunomodulators and immunosuppressants: Secondary | ICD-10-CM | POA: Diagnosis not present

## 2024-01-03 DIAGNOSIS — M0609 Rheumatoid arthritis without rheumatoid factor, multiple sites: Secondary | ICD-10-CM | POA: Diagnosis not present

## 2024-01-08 DIAGNOSIS — L97521 Non-pressure chronic ulcer of other part of left foot limited to breakdown of skin: Secondary | ICD-10-CM | POA: Diagnosis not present

## 2024-01-08 DIAGNOSIS — E1142 Type 2 diabetes mellitus with diabetic polyneuropathy: Secondary | ICD-10-CM | POA: Diagnosis not present

## 2024-01-10 DIAGNOSIS — M48062 Spinal stenosis, lumbar region with neurogenic claudication: Secondary | ICD-10-CM | POA: Diagnosis not present

## 2024-01-10 DIAGNOSIS — M5416 Radiculopathy, lumbar region: Secondary | ICD-10-CM | POA: Diagnosis not present

## 2024-01-11 DIAGNOSIS — G4733 Obstructive sleep apnea (adult) (pediatric): Secondary | ICD-10-CM | POA: Diagnosis not present

## 2024-01-16 DIAGNOSIS — G4733 Obstructive sleep apnea (adult) (pediatric): Secondary | ICD-10-CM | POA: Diagnosis not present

## 2024-01-30 DIAGNOSIS — D2239 Melanocytic nevi of other parts of face: Secondary | ICD-10-CM | POA: Diagnosis not present

## 2024-01-30 DIAGNOSIS — R208 Other disturbances of skin sensation: Secondary | ICD-10-CM | POA: Diagnosis not present

## 2024-01-30 DIAGNOSIS — D2271 Melanocytic nevi of right lower limb, including hip: Secondary | ICD-10-CM | POA: Diagnosis not present

## 2024-01-30 DIAGNOSIS — D2261 Melanocytic nevi of right upper limb, including shoulder: Secondary | ICD-10-CM | POA: Diagnosis not present

## 2024-01-30 DIAGNOSIS — D225 Melanocytic nevi of trunk: Secondary | ICD-10-CM | POA: Diagnosis not present

## 2024-01-30 DIAGNOSIS — L738 Other specified follicular disorders: Secondary | ICD-10-CM | POA: Diagnosis not present

## 2024-01-30 DIAGNOSIS — Z85828 Personal history of other malignant neoplasm of skin: Secondary | ICD-10-CM | POA: Diagnosis not present

## 2024-01-30 DIAGNOSIS — D2262 Melanocytic nevi of left upper limb, including shoulder: Secondary | ICD-10-CM | POA: Diagnosis not present

## 2024-01-30 DIAGNOSIS — L2989 Other pruritus: Secondary | ICD-10-CM | POA: Diagnosis not present

## 2024-01-30 DIAGNOSIS — L570 Actinic keratosis: Secondary | ICD-10-CM | POA: Diagnosis not present

## 2024-01-30 DIAGNOSIS — L538 Other specified erythematous conditions: Secondary | ICD-10-CM | POA: Diagnosis not present

## 2024-01-30 DIAGNOSIS — D485 Neoplasm of uncertain behavior of skin: Secondary | ICD-10-CM | POA: Diagnosis not present

## 2024-01-30 DIAGNOSIS — L820 Inflamed seborrheic keratosis: Secondary | ICD-10-CM | POA: Diagnosis not present

## 2024-02-06 DIAGNOSIS — M5126 Other intervertebral disc displacement, lumbar region: Secondary | ICD-10-CM | POA: Diagnosis not present

## 2024-02-06 DIAGNOSIS — M5416 Radiculopathy, lumbar region: Secondary | ICD-10-CM | POA: Diagnosis not present

## 2024-02-06 DIAGNOSIS — M48062 Spinal stenosis, lumbar region with neurogenic claudication: Secondary | ICD-10-CM | POA: Diagnosis not present

## 2024-02-08 DIAGNOSIS — G4733 Obstructive sleep apnea (adult) (pediatric): Secondary | ICD-10-CM | POA: Diagnosis not present

## 2024-02-11 ENCOUNTER — Other Ambulatory Visit: Payer: Self-pay | Admitting: Cardiovascular Disease

## 2024-03-10 DIAGNOSIS — G4733 Obstructive sleep apnea (adult) (pediatric): Secondary | ICD-10-CM | POA: Diagnosis not present

## 2024-04-09 DIAGNOSIS — G4733 Obstructive sleep apnea (adult) (pediatric): Secondary | ICD-10-CM | POA: Diagnosis not present

## 2024-05-10 DIAGNOSIS — G4733 Obstructive sleep apnea (adult) (pediatric): Secondary | ICD-10-CM | POA: Diagnosis not present

## 2024-06-09 DIAGNOSIS — G4733 Obstructive sleep apnea (adult) (pediatric): Secondary | ICD-10-CM | POA: Diagnosis not present

## 2024-06-17 DIAGNOSIS — E1151 Type 2 diabetes mellitus with diabetic peripheral angiopathy without gangrene: Secondary | ICD-10-CM | POA: Diagnosis not present

## 2024-06-17 DIAGNOSIS — E1165 Type 2 diabetes mellitus with hyperglycemia: Secondary | ICD-10-CM | POA: Diagnosis not present

## 2024-06-24 DIAGNOSIS — I350 Nonrheumatic aortic (valve) stenosis: Secondary | ICD-10-CM | POA: Diagnosis not present

## 2024-06-24 DIAGNOSIS — E1151 Type 2 diabetes mellitus with diabetic peripheral angiopathy without gangrene: Secondary | ICD-10-CM | POA: Diagnosis not present

## 2024-06-24 DIAGNOSIS — Z125 Encounter for screening for malignant neoplasm of prostate: Secondary | ICD-10-CM | POA: Diagnosis not present

## 2024-06-24 DIAGNOSIS — E782 Mixed hyperlipidemia: Secondary | ICD-10-CM | POA: Diagnosis not present

## 2024-06-24 DIAGNOSIS — M0609 Rheumatoid arthritis without rheumatoid factor, multiple sites: Secondary | ICD-10-CM | POA: Diagnosis not present

## 2024-06-24 DIAGNOSIS — M5416 Radiculopathy, lumbar region: Secondary | ICD-10-CM | POA: Diagnosis not present

## 2024-07-04 DIAGNOSIS — G4733 Obstructive sleep apnea (adult) (pediatric): Secondary | ICD-10-CM | POA: Diagnosis not present

## 2024-07-07 DIAGNOSIS — M7062 Trochanteric bursitis, left hip: Secondary | ICD-10-CM | POA: Diagnosis not present

## 2024-07-10 DIAGNOSIS — G4733 Obstructive sleep apnea (adult) (pediatric): Secondary | ICD-10-CM | POA: Diagnosis not present

## 2024-07-18 DIAGNOSIS — I350 Nonrheumatic aortic (valve) stenosis: Secondary | ICD-10-CM | POA: Diagnosis not present

## 2024-07-18 NOTE — Procedures (Signed)
 SABRA

## 2024-08-10 DIAGNOSIS — G4733 Obstructive sleep apnea (adult) (pediatric): Secondary | ICD-10-CM | POA: Diagnosis not present

## 2024-09-04 DIAGNOSIS — N410 Acute prostatitis: Secondary | ICD-10-CM | POA: Diagnosis not present

## 2024-09-04 DIAGNOSIS — R110 Nausea: Secondary | ICD-10-CM | POA: Diagnosis not present

## 2024-09-04 DIAGNOSIS — R350 Frequency of micturition: Secondary | ICD-10-CM | POA: Diagnosis not present

## 2024-09-04 DIAGNOSIS — R339 Retention of urine, unspecified: Secondary | ICD-10-CM | POA: Diagnosis not present

## 2024-09-08 DIAGNOSIS — K219 Gastro-esophageal reflux disease without esophagitis: Secondary | ICD-10-CM | POA: Diagnosis not present

## 2024-09-08 DIAGNOSIS — R339 Retention of urine, unspecified: Secondary | ICD-10-CM | POA: Diagnosis not present

## 2024-09-09 DIAGNOSIS — G4733 Obstructive sleep apnea (adult) (pediatric): Secondary | ICD-10-CM | POA: Diagnosis not present

## 2024-09-12 ENCOUNTER — Telehealth: Payer: Self-pay | Admitting: Cardiovascular Disease

## 2024-09-12 NOTE — Telephone Encounter (Signed)
   Pt c/o of Chest Pain: STAT if active CP, including tightness, pressure, jaw pain, radiating pain to shoulder/upper arm/back, CP unrelieved by Nitro. Symptoms reported of SOB, nausea, vomiting, sweating.  1. Are you having CP right now? Having chest pains about an hour ago.     2. Are you experiencing any other symptoms (ex. SOB, nausea, vomiting, sweating)? Wife stated he had no other symptoms when he had the chest pain or is having any symptoms now.    3. Is your CP continuous or coming and going? Was continuous at the time, but gone now.    4. Have you taken Nitroglycerin ? He took two pills, and the pain has stopped now.    5. How long have you been experiencing CP? Had pain about an hour ago, but the pain has stopped now.      6. If NO CP at time of call then end call with telling Pt to call back or call 911 if Chest pain returns prior to return call from triage team.

## 2024-09-12 NOTE — Telephone Encounter (Signed)
 Called patient regarding chest pain concerns. Patient was doing bible study this morning, he had just eaten some oatmeal and drank some regular coffee when he experienced a crushing chest pain that he thought was GERD at first but was unrelieved and felt like a heart attack since he has had one in the past. He says it started in the center and progressively worsened without radiating or jaw pain, etc. He took a nitroglycerin  and waited 5 minutes and was still having chest pain so he took another nitroglycerin , in which it was relieved at that time and he has not felt the pain since it happened at 1130. Stated the pain lasted around 25-30 minutes long. He has had a prior MI and stenting in 2022 and he knows of blockages and stenosis in his heart. Patient states he usually does not drink caffienated coffee but his father in law had a pot today so he decided to drink a cup of caffeinated coffee. He states he would like to be seen, appt made with Dr Argentina for Monday at 1020. Readdressed with patient that if chest pain comes back then it would be in his best interest to either let wife drive him to hospital or call 911 for assistance and further evaluation and if patient wanted some peace of mind at this moment, he would be just fine heading to the hospital now even without chest pain for further evaluation. Patient verbalized understanding and said all of his questions were answered and that he would go to the hospital for further workup if had experienced chest pain again. No further needs at this time.

## 2024-09-15 ENCOUNTER — Ambulatory Visit

## 2024-09-15 VITALS — BP 132/80 | HR 65 | Wt 248.0 lb

## 2024-09-15 DIAGNOSIS — R079 Chest pain, unspecified: Secondary | ICD-10-CM

## 2024-09-15 DIAGNOSIS — I255 Ischemic cardiomyopathy: Secondary | ICD-10-CM | POA: Diagnosis not present

## 2024-09-15 DIAGNOSIS — Z79899 Other long term (current) drug therapy: Secondary | ICD-10-CM

## 2024-09-15 DIAGNOSIS — I25118 Atherosclerotic heart disease of native coronary artery with other forms of angina pectoris: Secondary | ICD-10-CM

## 2024-09-15 DIAGNOSIS — E782 Mixed hyperlipidemia: Secondary | ICD-10-CM

## 2024-09-15 MED ORDER — ROSUVASTATIN CALCIUM 40 MG PO TABS: 40.0000 mg | ORAL_TABLET | Freq: Every day | ORAL | 3 refills | Status: AC

## 2024-09-15 MED ORDER — ISOSORBIDE MONONITRATE ER 60 MG PO TB24: 60.0000 mg | ORAL_TABLET | Freq: Every day | ORAL | 1 refills | Status: AC

## 2024-09-15 NOTE — Progress Notes (Signed)
 Cardiology Office Note   Date:  09/15/2024  ID:  Bobby Tanner, DOB Oct 01, 1951, MRN [id number] PCP: Bobby Oneil FALCON, MD  [facility] Providers Cardiologist:  Deatrice Cage, MD  History of Present Illness Bobby Tanner is a 73 y.o. male PMH CAD status post PCI LAD in setting of anterior STEMI and VF arrest, ischemic cardiomyopathy with recovered ejection fraction, mild aortic stenosis, HLD, HTN who presents for further evaluation of chest discomfort.  Patient reports progressive anginal symptoms over the last few months.  He notes an episode of rest pain that occurred on Friday.  He took 1 or 2 nitro tabs and this slowly eased up.  He contemplated coming to the ED.  He has not had another episode like that since then, but does note that his chronic angina seems to be increasing in both frequency and duration.  Last LDL 63 05/2024.  Relevant CVD History - TTE 06/2024 ([facility]) LVEF 55%, grade 2 diastolic dysfunction, mild AS - TTE 02/2023 normal biventricular function, mild AI, mild AS - Cath 08/2021 60% proximal RCA, 40% D1, patent previous LAD stent - TTE 06/2019 normal biventricular function without significant valvular disease - TTE 03/2016 LVEF 50 to 55% with LAD WMA - PCI LAD 2017 in setting of STEMI (required aspiration thrombectomy)   ROS: Pt denies any palpitations, syncope, presyncope, orthopnea, PND, or LE edema.  Studies Reviewed I have independently reviewed the patient's ECG, previous cardiac testing, recent medical records.  Physical Exam VS:  BP 132/80 (BP Location: Left Arm, Patient Position: Sitting, Cuff Size: Normal)   Pulse 65   Wt 248 lb (112.5 kg)   SpO2 98%   BMI 34.59 kg/m        Wt Readings from Last 3 Encounters:  09/15/24 248 lb (112.5 kg)  12/13/23 271 lb 6 oz (123.1 kg)  08/27/23 285 lb (129.3 kg)    GEN: No acute distress. NECK: No JVD; No carotid bruits. CARDIAC: RRR, no murmurs, rubs, gallops. RESPIRATORY:  Clear to  auscultation. EXTREMITIES:  Warm and well-perfused. No edema.  ASSESSMENT AND PLAN Chest discomfort CAD status post PCI LAD 2017 Ischemic cardiomyopathy with recovered ejection fraction Patient presents with progressive chest discomfort with exertion which seems most consistent with progressive angina.  He also had a single episode of rest pain last Friday which resolved with some sublingual NTG; symptoms are similar to his prior known angina.  His last angiogram in 2022 showed a 60% moderate stenosis of his proximal RCA, so this may have progressed since then.  Will also want to rule out significant ISR of his previous LAD stent that was placed in 2017.  We discussed noninvasive testing with a stress PET versus just going straight to invasive angiography.  Given clinical story, patient concerns/preference, and pretest probability, we settled on going straight to angiography.  Plan: - Will plan for coronary angiography with LVEDP; last echo done elsewhere also shows grade 2 diastolic dysfunction.  He appears euvolemic on exam. An LVEDP would be helpful in helping us  decide whether or not this is HFpEF. Need to evaluate coronaries first, however. - Continue ASA 81 mg daily - Will increase Crestor  to 40 mg daily - Will increase Imdur  to 60 mg for additional antianginal effect - I advised patient to proceed to the ED should he have any more chest discomfort that does not resolve with sublingual NTG or any other high risk symptoms  HLD Last LDL 63 05/2024.  Given his prior event and  atherosclerosis burden, we will opt for an LDL goal less than 55.  As above, increase Crestor  to 40 mg daily.   Informed Consent   The risks [stroke (1 in 1000), death (1 in 1000), kidney failure [usually temporary] (1 in 500), bleeding (1 in 200), allergic reaction [possibly serious] (1 in 200)], benefits (diagnostic support and management of coronary artery disease) and alternatives of a cardiac catheterization were  discussed in detail with Bobby Tanner and he is willing to proceed.     Dispo: RTC with regular provider after catheterization  Signed, Caron Poser, MD

## 2024-09-15 NOTE — H&P (View-Only) (Signed)
 Cardiology Office Note   Date:  09/15/2024  ID:  Bobby Tanner, DOB Oct 01, 1951, MRN [id number] PCP: Cleotilde Oneil FALCON, MD  [facility] Providers Cardiologist:  Deatrice Cage, MD  History of Present Illness Bobby Tanner is a 73 y.o. male PMH CAD status post PCI LAD in setting of anterior STEMI and VF arrest, ischemic cardiomyopathy with recovered ejection fraction, mild aortic stenosis, HLD, HTN who presents for further evaluation of chest discomfort.  Patient reports progressive anginal symptoms over the last few months.  He notes an episode of rest pain that occurred on Friday.  He took 1 or 2 nitro tabs and this slowly eased up.  He contemplated coming to the ED.  He has not had another episode like that since then, but does note that his chronic angina seems to be increasing in both frequency and duration.  Last LDL 63 05/2024.  Relevant CVD History - TTE 06/2024 ([facility]) LVEF 55%, grade 2 diastolic dysfunction, mild AS - TTE 02/2023 normal biventricular function, mild AI, mild AS - Cath 08/2021 60% proximal RCA, 40% D1, patent previous LAD stent - TTE 06/2019 normal biventricular function without significant valvular disease - TTE 03/2016 LVEF 50 to 55% with LAD WMA - PCI LAD 2017 in setting of STEMI (required aspiration thrombectomy)   ROS: Pt denies any palpitations, syncope, presyncope, orthopnea, PND, or LE edema.  Studies Reviewed I have independently reviewed the patient's ECG, previous cardiac testing, recent medical records.  Physical Exam VS:  BP 132/80 (BP Location: Left Arm, Patient Position: Sitting, Cuff Size: Normal)   Pulse 65   Wt 248 lb (112.5 kg)   SpO2 98%   BMI 34.59 kg/m        Wt Readings from Last 3 Encounters:  09/15/24 248 lb (112.5 kg)  12/13/23 271 lb 6 oz (123.1 kg)  08/27/23 285 lb (129.3 kg)    GEN: No acute distress. NECK: No JVD; No carotid bruits. CARDIAC: RRR, no murmurs, rubs, gallops. RESPIRATORY:  Clear to  auscultation. EXTREMITIES:  Warm and well-perfused. No edema.  ASSESSMENT AND PLAN Chest discomfort CAD status post PCI LAD 2017 Ischemic cardiomyopathy with recovered ejection fraction Patient presents with progressive chest discomfort with exertion which seems most consistent with progressive angina.  He also had a single episode of rest pain last Friday which resolved with some sublingual NTG; symptoms are similar to his prior known angina.  His last angiogram in 2022 showed a 60% moderate stenosis of his proximal RCA, so this may have progressed since then.  Will also want to rule out significant ISR of his previous LAD stent that was placed in 2017.  We discussed noninvasive testing with a stress PET versus just going straight to invasive angiography.  Given clinical story, patient concerns/preference, and pretest probability, we settled on going straight to angiography.  Plan: - Will plan for coronary angiography with LVEDP; last echo done elsewhere also shows grade 2 diastolic dysfunction.  He appears euvolemic on exam. An LVEDP would be helpful in helping us  decide whether or not this is HFpEF. Need to evaluate coronaries first, however. - Continue ASA 81 mg daily - Will increase Crestor  to 40 mg daily - Will increase Imdur  to 60 mg for additional antianginal effect - I advised patient to proceed to the ED should he have any more chest discomfort that does not resolve with sublingual NTG or any other high risk symptoms  HLD Last LDL 63 05/2024.  Given his prior event and  atherosclerosis burden, we will opt for an LDL goal less than 55.  As above, increase Crestor  to 40 mg daily.   Informed Consent   The risks [stroke (1 in 1000), death (1 in 1000), kidney failure [usually temporary] (1 in 500), bleeding (1 in 200), allergic reaction [possibly serious] (1 in 200)], benefits (diagnostic support and management of coronary artery disease) and alternatives of a cardiac catheterization were  discussed in detail with Mr. Whitham and he is willing to proceed.     Dispo: RTC with regular provider after catheterization  Signed, Caron Poser, MD

## 2024-09-15 NOTE — Patient Instructions (Signed)
 Medication Instructions:  Your physician recommends the following medication changes.  INCREASE: Isosorbide  Mononitrate (IMDUR ) from 30 MG  to 60 MG by mouth once daily Rosuvastatin  (CRESTOR ) from 20 MG to 40 MG  by mouth once daily   *If you need a refill on your cardiac medications before your next appointment, please call your pharmacy*  Lab Work: Your provider would like for you to have following labs drawn today CBC BMP.   If you have labs (blood work) drawn today and your tests are completely normal, you will receive your results only by: MyChart Message (if you have MyChart) OR A paper copy in the mail If you have any lab test that is abnormal or we need to change your treatment, we will call you to review the results.  Testing/Procedures:  [facility] DEPT OF [facility] AT [facility] [address] [place], [address] [place] [address] Dept: [phone] Loc: [phone]  Bobby Tanner  09/15/2024  You are scheduled for a Cardiac Catheterization on Monday, October 27 with Dr. Deatrice Cage.  1. Please arrive at the [facility] Entrance of [facility], [address], [place] [address] at 8:30 AM (This is 1 hour(s) prior to your procedure time).  Proceed to the Check-In Desk directly inside the entrance.  Procedure Parking: Use the entrance off of the [address] side of the hospital. Turn right upon entering and follow the driveway to parking that is directly in front of the Heart & Vascular Center. There is no valet parking available at this entrance, however there is an awning directly in front of the Heart & Vascular Center for drop off/ pick up for patients.  Special note: Every effort is made to have your procedure done on time. Please understand that emergencies sometimes delay scheduled procedures.  2. Diet: Nothing to eat after midnight.   3. Hydration: You need to be well hydrated  before your procedure. On October 27, you may drink approved liquids (see below) until 2 hours before the procedure, with 16 oz of water as your last intake.   List of approved liquids water, clear juice, clear tea, black coffee, fruit juices, non-citric and without pulp, carbonated beverages, Gatorade, Kool -Aid, plain Jello-O and plain ice popsicles.  4. Labs:  CBC and BMP drawn on 09/15/2024  5. Medication instructions in preparation for your procedure:   Contrast Allergy: No  Stop taking, Lasix  (Furosemide )  Monday, October 27,  On the morning of your procedure, take your Aspirin  81 mg and any morning medicines NOT listed above.  You may use sips of water.  6. Plan to go home the same day, you will only stay overnight if medically necessary. 7. Bring a current list of your medications and current insurance cards. 8. You MUST have a responsible person to drive you home. 9. Someone MUST be with you the first 24 hours after you arrive home or your discharge will be delayed. 10. Please wear clothes that are easy to get on and off and wear slip-on shoes.  Thank you for allowing us  to care for you!   -- [facility]   Follow-Up: At [facility], you and your health needs are our priority.  As part of our continuing mission to provide you with exceptional heart care, our providers are all part of one team.  This team includes your primary Cardiologist (physician) and Advanced Practice Providers or APPs (  Physician Assistants and Nurse Practitioners) who all work together to provide you with the care you need, when you need it.  Your next appointment:   Post Cath   Provider:   You may see Deatrice Cage, MD or one of the following Advanced Practice Providers on your designated Care Team:   Lonni Meager, NP Lesley Maffucci, PA-C Bernardino Bring, PA-C Cadence Algoma, PA-C Tylene Lunch, NP Barnie Hila, NP    We recommend signing up for the  patient portal called MyChart.  Sign up information is provided on this After Visit Summary.  MyChart is used to connect with patients for Virtual Visits (Telemedicine).  Patients are able to view lab/test results, encounter notes, upcoming appointments, etc.  Non-urgent messages can be sent to your provider as well.   To learn more about what you can do with MyChart, go to [email or web address].

## 2024-09-16 ENCOUNTER — Ambulatory Visit: Payer: Self-pay

## 2024-09-16 LAB — BASIC METABOLIC PANEL WITH GFR
BUN/Creatinine Ratio: 11 (ref 10–24)
BUN: 13 mg/dL (ref 8–27)
CO2: 25 mmol/L (ref 20–29)
Calcium: 9.5 mg/dL (ref 8.6–10.2)
Chloride: 103 mmol/L (ref 96–106)
Creatinine, Ser: 1.17 mg/dL (ref 0.76–1.27)
Glucose: 98 mg/dL (ref 70–99)
Potassium: 4.8 mmol/L (ref 3.5–5.2)
Sodium: 141 mmol/L (ref 134–144)
eGFR: 66 mL/min/1.73 (ref >59)

## 2024-09-16 LAB — CBC
Hematocrit: 46.4 % (ref 37.5–51.0)
Hemoglobin: 15.1 g/dL (ref 13.0–17.7)
MCH: 30.6 pg (ref 26.6–33.0)
MCHC: 32.5 g/dL (ref 31.5–35.7)
MCV: 94 fL (ref 79–97)
Platelets: 226 x10E3/uL (ref 150–450)
RBC: 4.93 x10E6/uL (ref 4.14–5.80)
RDW: 13.7 % (ref 11.6–15.4)
WBC: 9 x10E3/uL (ref 3.4–10.8)

## 2024-09-22 ENCOUNTER — Other Ambulatory Visit: Payer: Self-pay

## 2024-09-22 ENCOUNTER — Encounter: Admission: RE | Disposition: A | Payer: Self-pay | Source: Home / Self Care | Attending: Cardiovascular Disease

## 2024-09-22 ENCOUNTER — Encounter: Payer: Self-pay | Admitting: Cardiovascular Disease

## 2024-09-22 ENCOUNTER — Observation Stay
Admission: RE | Admit: 2024-09-22 | Discharge: 2024-09-23 | Disposition: A | Discharge status: Home / Self Care | Attending: Cardiovascular Disease | Admitting: Cardiovascular Disease

## 2024-09-22 DIAGNOSIS — Z7982 Long term (current) use of aspirin: Secondary | ICD-10-CM | POA: Diagnosis not present

## 2024-09-22 DIAGNOSIS — I10 Essential (primary) hypertension: Secondary | ICD-10-CM | POA: Diagnosis not present

## 2024-09-22 DIAGNOSIS — I110 Hypertensive heart disease with heart failure: Secondary | ICD-10-CM | POA: Insufficient documentation

## 2024-09-22 DIAGNOSIS — E785 Hyperlipidemia, unspecified: Secondary | ICD-10-CM | POA: Diagnosis present

## 2024-09-22 DIAGNOSIS — Z79899 Other long term (current) drug therapy: Secondary | ICD-10-CM | POA: Insufficient documentation

## 2024-09-22 DIAGNOSIS — I200 Unstable angina: Principal | ICD-10-CM | POA: Diagnosis present

## 2024-09-22 DIAGNOSIS — R079 Chest pain, unspecified: Secondary | ICD-10-CM

## 2024-09-22 DIAGNOSIS — G4733 Obstructive sleep apnea (adult) (pediatric): Secondary | ICD-10-CM | POA: Diagnosis not present

## 2024-09-22 DIAGNOSIS — I2510 Atherosclerotic heart disease of native coronary artery without angina pectoris: Secondary | ICD-10-CM | POA: Diagnosis present

## 2024-09-22 DIAGNOSIS — I25118 Atherosclerotic heart disease of native coronary artery with other forms of angina pectoris: Secondary | ICD-10-CM

## 2024-09-22 DIAGNOSIS — I255 Ischemic cardiomyopathy: Secondary | ICD-10-CM | POA: Diagnosis not present

## 2024-09-22 DIAGNOSIS — I25110 Atherosclerotic heart disease of native coronary artery with unstable angina pectoris: Principal | ICD-10-CM | POA: Insufficient documentation

## 2024-09-22 HISTORY — PX: LEFT HEART CATH AND CORONARY ANGIOGRAPHY: CATH118249

## 2024-09-22 HISTORY — PX: CORONARY IMAGING/OCT: CATH118326

## 2024-09-22 HISTORY — PX: CORONARY STENT INTERVENTION: CATH118234

## 2024-09-22 LAB — POCT ACTIVATED CLOTTING TIME
Activated Clotting Time: 285 s
Activated Clotting Time: 314 s

## 2024-09-22 LAB — GLUCOSE, CAPILLARY
Glucose-Capillary: 86 mg/dL (ref 70–99)
Glucose-Capillary: 100 mg/dL — ABNORMAL HIGH (ref 70–99)

## 2024-09-22 SURGERY — LEFT HEART CATH AND CORONARY ANGIOGRAPHY
Anesthesia: Moderate Sedation

## 2024-09-22 MED ORDER — MIDAZOLAM HCL 2 MG/2ML IJ SOLN
INTRAMUSCULAR | Status: AC
  Filled 2024-09-22: qty 2

## 2024-09-22 MED ORDER — VERAPAMIL HCL 2.5 MG/ML IV SOLN
INTRAVENOUS | Status: DC | PRN
  Administered 2024-09-22: 2.5 mg via INTRACORONARY

## 2024-09-22 MED ORDER — FENTANYL CITRATE (PF) 100 MCG/2ML IJ SOLN
INTRAMUSCULAR | Status: AC
  Filled 2024-09-22: qty 2

## 2024-09-22 MED ORDER — FREE WATER: 500.0000 mL | Freq: Once | Status: DC

## 2024-09-22 MED ORDER — ACETAMINOPHEN 325 MG PO TABS
ORAL_TABLET | ORAL | Status: AC
  Administered 2024-09-22: 650 mg via ORAL
  Filled 2024-09-22: qty 2

## 2024-09-22 MED ORDER — ROSUVASTATIN CALCIUM 20 MG PO TABS
40.0000 mg | ORAL_TABLET | Freq: Every day | ORAL | Status: DC
  Administered 2024-09-23: 40 mg via ORAL
  Filled 2024-09-22: qty 2
  Filled 2024-09-22: qty 4

## 2024-09-22 MED ORDER — FREE WATER
500.0000 mL | Freq: Once | Status: AC
  Administered 2024-09-22: 250 mL via ORAL

## 2024-09-22 MED ORDER — ISOSORBIDE MONONITRATE ER 60 MG PO TB24
60.0000 mg | ORAL_TABLET | Freq: Every day | ORAL | Status: DC
  Administered 2024-09-23: 60 mg via ORAL
  Filled 2024-09-22: qty 1

## 2024-09-22 MED ORDER — CLOPIDOGREL BISULFATE 75 MG PO TABS
ORAL_TABLET | ORAL | Status: AC
  Filled 2024-09-22: qty 8

## 2024-09-22 MED ORDER — SODIUM CHLORIDE 0.9 % IV SOLN
250.0000 mL | INTRAVENOUS | Status: DC | PRN
  Administered 2024-09-22: 250 mL via INTRAVENOUS

## 2024-09-22 MED ORDER — TRAMADOL HCL 50 MG PO TABS
50.0000 mg | ORAL_TABLET | Freq: Two times a day (BID) | ORAL | Status: DC
  Administered 2024-09-22 – 2024-09-23 (×2): 50 mg via ORAL
  Filled 2024-09-22 (×2): qty 1

## 2024-09-22 MED ORDER — NITROGLYCERIN 1 MG/10 ML FOR IR/CATH LAB
INTRA_ARTERIAL | Status: AC
  Filled 2024-09-22: qty 10

## 2024-09-22 MED ORDER — FENTANYL CITRATE (PF) 100 MCG/2ML IJ SOLN
INTRAMUSCULAR | Status: DC | PRN
  Administered 2024-09-22: 50 ug via INTRAVENOUS
  Administered 2024-09-22: 25 ug via INTRAVENOUS

## 2024-09-22 MED ORDER — PANTOPRAZOLE SODIUM 40 MG PO TBEC
40.0000 mg | DELAYED_RELEASE_TABLET | Freq: Every day | ORAL | Status: DC
  Administered 2024-09-23: 40 mg via ORAL
  Filled 2024-09-22: qty 1

## 2024-09-22 MED ORDER — ATROPINE SULFATE 1 MG/10ML IJ SOSY
PREFILLED_SYRINGE | INTRAMUSCULAR | Status: AC
  Filled 2024-09-22: qty 10

## 2024-09-22 MED ORDER — NITROGLYCERIN IN D5W 200-5 MCG/ML-% IV SOLN
INTRAVENOUS | Status: DC | PRN
  Administered 2024-09-22: 10 ug/min via INTRAVENOUS

## 2024-09-22 MED ORDER — CLOPIDOGREL BISULFATE 75 MG PO TABS
75.0000 mg | ORAL_TABLET | Freq: Every day | ORAL | Status: DC
  Administered 2024-09-23: 75 mg via ORAL
  Filled 2024-09-22: qty 1

## 2024-09-22 MED ORDER — ACETAMINOPHEN 325 MG PO TABS
650.0000 mg | ORAL_TABLET | ORAL | Status: DC | PRN
  Administered 2024-09-22: 650 mg via ORAL
  Filled 2024-09-22: qty 2

## 2024-09-22 MED ORDER — TEMAZEPAM 30 MG PO CAPS
30.0000 mg | ORAL_CAPSULE | Freq: Every evening | ORAL | Status: DC | PRN
Start: 2024-09-22 — End: 2024-09-23
  Administered 2024-09-22: 30 mg via ORAL
  Filled 2024-09-22: qty 1
  Filled 2024-09-22: qty 2

## 2024-09-22 MED ORDER — LATANOPROST 0.005 % OP SOLN
1.0000 [drp] | Freq: Every day | OPHTHALMIC | Status: DC
  Administered 2024-09-22: 1 [drp] via OPHTHALMIC
  Filled 2024-09-22: qty 2.5

## 2024-09-22 MED ORDER — NITROGLYCERIN IN D5W 200-5 MCG/ML-% IV SOLN
INTRAVENOUS | Status: AC
  Filled 2024-09-22: qty 250

## 2024-09-22 MED ORDER — ADENOSINE (DIAGNOSTIC) FOR INTRACORONARY USE
INTRAVENOUS | Status: DC | PRN
  Administered 2024-09-22 (×4): 120 ug via INTRACORONARY

## 2024-09-22 MED ORDER — ZOLPIDEM TARTRATE 10 MG PO TABS: 10.0000 mg | ORAL_TABLET | Freq: Every day | ORAL | Status: DC

## 2024-09-22 MED ORDER — HEPARIN SODIUM (PORCINE) 1000 UNIT/ML IJ SOLN
INTRAMUSCULAR | Status: DC | PRN
  Administered 2024-09-22: 2000 [IU] via INTRAVENOUS
  Administered 2024-09-22: 5000 [IU] via INTRAVENOUS
  Administered 2024-09-22: 6000 [IU] via INTRAVENOUS

## 2024-09-22 MED ORDER — HEPARIN SODIUM (PORCINE) 1000 UNIT/ML IJ SOLN
INTRAMUSCULAR | Status: AC
  Filled 2024-09-22: qty 10

## 2024-09-22 MED ORDER — METHOTREXATE SODIUM 2.5 MG PO TABS: 20.0000 mg | ORAL_TABLET | ORAL | Status: DC

## 2024-09-22 MED ORDER — ONDANSETRON HCL 4 MG/2ML IJ SOLN
4.0000 mg | Freq: Three times a day (TID) | INTRAMUSCULAR | Status: DC | PRN
  Administered 2024-09-22: 4 mg via INTRAVENOUS
  Filled 2024-09-22: qty 2

## 2024-09-22 MED ORDER — SODIUM CHLORIDE 0.9% FLUSH: 3.0000 mL | INTRAVENOUS | Status: DC | PRN

## 2024-09-22 MED ORDER — IOHEXOL 300 MG/ML  SOLN
INTRAMUSCULAR | Status: DC | PRN
  Administered 2024-09-22: 120 mL

## 2024-09-22 MED ORDER — ONDANSETRON 4 MG PO TBDP: 4.0000 mg | ORAL_TABLET | Freq: Three times a day (TID) | ORAL | Status: DC | PRN

## 2024-09-22 MED ORDER — SODIUM CHLORIDE 0.9 % IV SOLN: 250.0000 mL | INTRAVENOUS | Status: DC | PRN

## 2024-09-22 MED ORDER — HEPARIN (PORCINE) IN NACL 1000-0.9 UT/500ML-% IV SOLN
INTRAVENOUS | Status: AC
  Filled 2024-09-22: qty 1000

## 2024-09-22 MED ORDER — ATROPINE SULFATE 1 MG/10ML IJ SOSY
PREFILLED_SYRINGE | INTRAMUSCULAR | Status: DC | PRN
  Administered 2024-09-22 (×2): .5 mg via INTRAVENOUS

## 2024-09-22 MED ORDER — SODIUM CHLORIDE 0.9% FLUSH
3.0000 mL | Freq: Two times a day (BID) | INTRAVENOUS | Status: DC
  Administered 2024-09-22: 3 mL via INTRAVENOUS

## 2024-09-22 MED ORDER — LIDOCAINE HCL 1 % IJ SOLN
INTRAMUSCULAR | Status: AC
  Filled 2024-09-22: qty 20

## 2024-09-22 MED ORDER — NITROGLYCERIN IN D5W 200-5 MCG/ML-% IV SOLN: 0.0000 ug/min | INTRAVENOUS | Status: DC

## 2024-09-22 MED ORDER — TAMSULOSIN HCL 0.4 MG PO CAPS
0.4000 mg | ORAL_CAPSULE | Freq: Every day | ORAL | Status: DC
  Administered 2024-09-22: 0.4 mg via ORAL
  Filled 2024-09-22: qty 1

## 2024-09-22 MED ORDER — ASPIRIN 81 MG PO TBEC
81.0000 mg | DELAYED_RELEASE_TABLET | Freq: Every morning | ORAL | Status: DC
  Administered 2024-09-23: 81 mg via ORAL
  Filled 2024-09-22: qty 1

## 2024-09-22 MED ORDER — MIDAZOLAM HCL (PF) 2 MG/2ML IJ SOLN
INTRAMUSCULAR | Status: DC | PRN
  Administered 2024-09-22: 1 mg via INTRAVENOUS

## 2024-09-22 MED ORDER — CLOPIDOGREL BISULFATE 75 MG PO TABS
ORAL_TABLET | ORAL | Status: DC | PRN
  Administered 2024-09-22: 600 mg via ORAL

## 2024-09-22 MED ORDER — VERAPAMIL HCL 2.5 MG/ML IV SOLN
INTRAVENOUS | Status: AC
  Filled 2024-09-22: qty 2

## 2024-09-22 MED ORDER — NITROGLYCERIN 1 MG/10 ML FOR IR/CATH LAB
INTRA_ARTERIAL | Status: DC | PRN
  Administered 2024-09-22 (×2): 200 ug via INTRACORONARY

## 2024-09-22 MED ORDER — HEPARIN (PORCINE) IN NACL 1000-0.9 UT/500ML-% IV SOLN
INTRAVENOUS | Status: DC | PRN
  Administered 2024-09-22: 1000 mL

## 2024-09-22 MED ORDER — LIDOCAINE HCL (PF) 1 % IJ SOLN
INTRAMUSCULAR | Status: DC | PRN
  Administered 2024-09-22: 2 mL

## 2024-09-22 MED ORDER — SODIUM CHLORIDE 0.9 % IV SOLN
INTRAVENOUS | Status: AC | PRN
  Administered 2024-09-22: 10 mL/h via INTRAVENOUS

## 2024-09-22 SURGICAL SUPPLY — 16 items
BALLOON ~~LOC~~ TREK NEO RX 4.5X15 (BALLOONS) IMPLANT
CATH DRAGONFLY OPSTAR (CATHETERS) IMPLANT
CATH INFINITI 5 FR JL3.5 (CATHETERS) IMPLANT
CATH INFINITI AMBI 5FR TG (CATHETERS) IMPLANT
CATH LAUNCHER 6FR JR4 (CATHETERS) IMPLANT
DEVICE RAD TR BAND REGULAR (VASCULAR PRODUCTS) IMPLANT
DRAPE BRACHIAL (DRAPES) IMPLANT
GLIDESHEATH SLEND SS 6F .021 (SHEATH) IMPLANT
GUIDEWIRE INQWIRE 1.5J.035X260 (WIRE) IMPLANT
KIT ENCORE 26 ADVANTAGE (KITS) IMPLANT
PACK CARDIAC CATH (CUSTOM PROCEDURE TRAY) ×2 IMPLANT
SET ATX-X65L (MISCELLANEOUS) IMPLANT
STATION PROTECTION PRESSURIZED (MISCELLANEOUS) IMPLANT
STENT ONYX FRONTIER 4.0X26 (Permanent Stent) IMPLANT
TUBING CIL FLEX 10 FLL-RA (TUBING) IMPLANT
WIRE RUNTHROUGH .014X180CM (WIRE) IMPLANT

## 2024-09-22 NOTE — Plan of Care (Signed)
  Problem: Activity: Goal: Ability to return to baseline activity level will improve Outcome: Progressing   Problem: Cardiovascular: Goal: Ability to achieve and maintain adequate cardiovascular perfusion will improve Outcome: Progressing   Problem: Clinical Measurements: Goal: Diagnostic test results will improve Outcome: Progressing

## 2024-09-22 NOTE — Interval H&P Note (Signed)
 History and Physical Interval Note:  09/22/2024 9:36 AM  Bobby Tanner  has presented today for surgery, with the diagnosis of L Cath   Chest pain.  The various methods of treatment have been discussed with the patient and family. After consideration of risks, benefits and other options for treatment, the patient has consented to  Procedure(s): LEFT HEART CATH AND CORONARY ANGIOGRAPHY (Left) as a surgical intervention.  The patient's history has been reviewed, patient examined, no change in status, stable for surgery.  I have reviewed the patient's chart and labs.  Questions were answered to the patient's satisfaction.     Sylvanus Telford

## 2024-09-23 DIAGNOSIS — I25110 Atherosclerotic heart disease of native coronary artery with unstable angina pectoris: Secondary | ICD-10-CM | POA: Diagnosis not present

## 2024-09-23 DIAGNOSIS — I200 Unstable angina: Secondary | ICD-10-CM | POA: Diagnosis not present

## 2024-09-23 LAB — CBC
HCT: 42.3 % (ref 39.0–52.0)
Hemoglobin: 14.3 g/dL (ref 13.0–17.0)
MCH: 30.8 pg (ref 26.0–34.0)
MCHC: 33.8 g/dL (ref 30.0–36.0)
MCV: 91.2 fL (ref 80.0–100.0)
Platelets: 185 K/uL (ref 150–400)
RBC: 4.64 MIL/uL (ref 4.22–5.81)
RDW: 13.6 % (ref 11.5–15.5)
WBC: 8.1 K/uL (ref 4.0–10.5)
nRBC: 0 % (ref 0.0–0.2)

## 2024-09-23 LAB — BASIC METABOLIC PANEL WITH GFR
Anion gap: 9 (ref 5–15)
BUN: 11 mg/dL (ref 8–23)
CO2: 27 mmol/L (ref 22–32)
Calcium: 9 mg/dL (ref 8.9–10.3)
Chloride: 103 mmol/L (ref 98–111)
Creatinine, Ser: 0.99 mg/dL (ref 0.61–1.24)
GFR, Estimated: >60 mL/min (ref >60)
Glucose, Bld: 89 mg/dL (ref 70–99)
Potassium: 4.5 mmol/L (ref 3.5–5.1)
Sodium: 139 mmol/L (ref 135–145)

## 2024-09-23 MED ORDER — CLOPIDOGREL BISULFATE 75 MG PO TABS: 75.0000 mg | ORAL_TABLET | Freq: Every day | ORAL | 3 refills | Status: AC

## 2024-09-23 NOTE — TOC CM/SW Note (Signed)
 Transition of Care [facility]) - Inpatient Brief Assessment   Patient Details  Name: Bobby Tanner MRN: [id number] Date of Birth: 15-Feb-1951  Transition of Care [facility]) CM/SW Contact:    Lauraine JAYSON Carpen, LCSW Phone Number: 09/23/2024, 8:58 AM   Clinical Narrative: Patient has orders to discharge home today. CSW reviewed chart. No TOC needs identified. CSW signing off.  Transition of Care Asessment: Insurance and Status: Insurance coverage has been reviewed Patient has primary care physician: Yes Home environment has been reviewed: Single family home Prior level of function:: Not documented Prior/Current Home Services: No current home services Social Drivers of Health Review: SDOH reviewed no interventions necessary Readmission risk has been reviewed: Yes Transition of care needs: no transition of care needs at this time

## 2024-09-23 NOTE — Discharge Summary (Signed)
 Discharge Summary   Patient ID: Bobby Tanner MRN: [id number]; DOB: 11-19-1951  Admit date: 09/22/2024 Discharge date: 09/23/2024  PCP:  Cleotilde Oneil FALCON, MD   [facility] Providers Cardiologist:  Deatrice Cage, MD    Discharge Diagnoses  Principal Problem:   Unstable angina [facility]) Active Problems:   Morbid obesity (HCC)   Hyperlipidemia   Essential hypertension   Obstructive sleep apnea   Coronary artery disease   Diagnostic Studies/Procedures   LHC 09/22/24   1st Diag lesion is 40% stenosed.   Prox RCA to Mid RCA lesion is 90% stenosed.   Dist LAD lesion is 50% stenosed.   Non-stenotic Prox LAD to Mid LAD lesion was previously treated.   A drug-eluting stent was successfully placed using a STENT ONYX FRONTIER 4.0X26.   Post intervention, there is a 0% residual stenosis.   In the absence of any other complications or medical issues, we expect the patient to be ready for discharge from an interventional cardiology perspective on 09/23/2024.   Recommend uninterrupted dual antiplatelet therapy with Aspirin  81mg  daily and Clopidogrel  75mg  daily for a minimum of 6 months (stable ischemic heart disease-Class I recommendation).   1.  Widely patent LAD stent with no significant restenosis.  Significant progression of proximal RCA stenosis. 2.  Left ventricular angiography was not performed.  LVEDP was minimally elevated at 13 mmHg. 3.  Successful OCT guided direct stenting of the right coronary artery.  This was complicated by symptomatic slow flow that improved significantly with intracoronary adenosine.   Recommendations: Will monitor the patient overnight and start nitroglycerin  drip given mild residual chest pain.   Recommendations  Antiplatelet/Anticoag Recommend uninterrupted dual antiplatelet therapy with Aspirin  81mg  daily and Clopidogrel  75mg  daily for a minimum of 6 months (stable ischemic heart disease-Class I recommendation).  Discharge Date In the absence  of any other complications or medical issues, we expect the patient to be ready for discharge from an interventional cardiology perspective on 09/23/2024.    Coronary Diagrams  Diagnostic Dominance: Right  Intervention     _____________   History of Present Illness   Bobby Tanner is a 73 y.o. male with a history of CAD s/p PCI LAD in the setting of anterior STEMI and VF arrest, ICM with recovered EF, mild AS, HLD, HTN who presented with chest pain.   His last Cath in 2022 showed a 60% moderate stenosis of the proximal RCA, 40% 1st diag, widely patent LAD stent. Medical therapy was recommended. Echo 2024 showed LVEF 60-65%, no WMA, mild MD, mild AI.  The patient presented to the clinic 09/15/24 with progressive anginal symptoms over the last few months. He had rest pain a few days prior. He was ultimately set up for a left heart cath.   Hospital Course   Consultants: None  The patient presented 09/22/24 for scheduled cath procedure. [facility] showed widely patent LAD stent with no significant restenosis, significant progression of pRCA stenosis. He was treated with PCI/DES of the RCA. This was complicated by slow flow that improved significantly with intracoronary adenosine. He was admitted post-procedure overnight due to mild residual chest pain, started on nitroglycerin  drip. He was started on DAPT with ASA and Plavix  for at least 6 months. Patient remained stable overnight and chest pain eventually resolved. He reports persistent and chronic chest pressure at night improved by CPAP machine. He ambulated without issues. Cath site remained stable. BP was elevated, it was recommended he check BP at home. Follow-up labs remained stable.  New medications (Plavix ) was sent in to [facility] [address]. All other medications were continued at discharge.  The patient was seen 09/23/24 and felt to be stable for discharge.   GEN: No acute distress. NECK: No JVD; No carotid bruits. CARDIAC: RRR, no  murmurs, rubs, gallops. RESPIRATORY:  Clear to auscultation. EXTREMITIES:  Warm and well-perfused. No edema.    Did the patient have an acute coronary syndrome (MI, NSTEMI, STEMI, etc) this admission?:  No                               Did the patient have a percutaneous coronary intervention (stent / angioplasty)?:  Yes.     Cath/PCI Registry Performance & Quality Measures: Aspirin  prescribed? - Yes ADP Receptor Inhibitor (Plavix /Clopidogrel , Brilinta /Ticagrelor  or Effient/Prasugrel) prescribed (includes medically managed patients)? - Yes High Intensity Statin (Lipitor 40-80mg  or Crestor  20-40mg ) prescribed? - Yes For EF <40%, was ACEI/ARB prescribed? - Not Applicable (EF >/= 40%) For EF <40%, Aldosterone Antagonist (Spironolactone or Eplerenone) prescribed? - Not Applicable (EF >/= 40%) Cardiac Rehab Phase II ordered? - Yes   _____________  Discharge Vitals Blood pressure (!) 154/78, pulse (!) 56, temperature (!) 97.4 F (36.3 C), resp. rate 17, height 5' 11 (1.803 m), weight 110.2 kg, SpO2 99%.  Filed Weights   09/22/24 0901  Weight: 110.2 kg    Labs & Radiologic Studies  CBC Recent Labs    09/23/24 0601  WBC 8.1  HGB 14.3  HCT 42.3  MCV 91.2  PLT 185   Basic Metabolic Panel Recent Labs    89/71/74 0601  NA 139  K 4.5  CL 103  CO2 27  GLUCOSE 89  BUN 11  CREATININE 0.99  CALCIUM  9.0   Liver Function Tests No results for input(s): AST, ALT, ALKPHOS, BILITOT, PROT, ALBUMIN in the last 72 hours. No results for input(s): LIPASE, AMYLASE in the last 72 hours. High Sensitivity Troponin:   No results for input(s): TROPONINIHS in the last 720 hours.  No results for input(s): TRNPT in the last 720 hours.  BNP Invalid input(s): POCBNP No results for input(s): PROBNP in the last 72 hours.  No results for input(s): BNP in the last 72 hours.  D-Dimer No results for input(s): DDIMER in the last 72 hours. Hemoglobin A1C No results for  input(s): HGBA1C in the last 72 hours. Fasting Lipid Panel No results for input(s): CHOL, HDL, LDLCALC, TRIG, CHOLHDL, LDLDIRECT in the last 72 hours. No results found for: LIPOA  Thyroid  Function Tests No results for input(s): TSH, T4TOTAL, T3FREE, THYROIDAB in the last 72 hours.  Invalid input(s): FREET3 _____________  CARDIAC CATHETERIZATION Result Date: 09/22/2024   1st Diag lesion is 40% stenosed.   Prox RCA to Mid RCA lesion is 90% stenosed.   Dist LAD lesion is 50% stenosed.   Non-stenotic Prox LAD to Mid LAD lesion was previously treated.   A drug-eluting stent was successfully placed using a STENT ONYX FRONTIER 4.0X26.   Post intervention, there is a 0% residual stenosis.   In the absence of any other complications or medical issues, we expect the patient to be ready for discharge from an interventional cardiology perspective on 09/23/2024.   Recommend uninterrupted dual antiplatelet therapy with Aspirin  81mg  daily and Clopidogrel  75mg  daily for a minimum of 6 months (stable ischemic heart disease-Class I recommendation). 1.  Widely patent LAD stent with no significant restenosis.  Significant progression of proximal RCA stenosis.  2.  Left ventricular angiography was not performed.  LVEDP was minimally elevated at 13 mmHg. 3.  Successful OCT guided direct stenting of the right coronary artery.  This was complicated by symptomatic slow flow that improved significantly with intracoronary adenosine. Recommendations: Will monitor the patient overnight and start nitroglycerin  drip given mild residual chest pain.    Disposition Pt is being discharged home today in good condition.  Follow-up Plans & Appointments  Discharge Instructions     AMB Referral to Cardiac Rehabilitation - Phase II   Complete by: As directed    Diagnosis: Coronary Stents   After initial evaluation and assessments completed: Virtual Based Care may be provided alone or in conjunction with  Phase 2 Cardiac Rehab based on patient barriers.: Yes   Intensive Cardiac Rehabilitation (ICR) [facility] location only OR Traditional Cardiac Rehabilitation (TCR) *If criteria for ICR are not met will enroll in TCR [facility] only): Yes       Discharge Medications Allergies as of 09/23/2024       Reactions   Pollen Extract Itching, Shortness Of Breath   Cat Dander Itching   Dog Epithelium (canis Lupus Familiaris) Other (See Comments)   Plaquenil [hydroxychloroquine] Other (See Comments)   SORE THROAT AND STOMACH PAIN        Medication List     TAKE these medications    aspirin  EC 81 MG tablet Take 81 mg by mouth in the morning.   azelastine  0.1 % nasal spray Commonly known as: ASTELIN  Place 2 sprays into the nose at bedtime as needed for allergies.   clopidogrel  75 MG tablet Commonly known as: PLAVIX  Take 1 tablet (75 mg total) by mouth daily with breakfast.   folic acid 1 MG tablet Commonly known as: FOLVITE Take 1 mg by mouth daily.   furosemide  20 MG tablet Commonly known as: LASIX  TAKE 1 TABLET BY MOUTH DAILY AS NEEDED   isosorbide  mononitrate 60 MG 24 hr tablet Commonly known as: IMDUR  Take 1 tablet (60 mg total) by mouth daily.   lansoprazole 30 MG capsule Commonly known as: PREVACID Take 30 mg by mouth in the morning.   latanoprost 0.005 % ophthalmic solution Commonly known as: XALATAN Place 1 drop into the left eye at bedtime.   methotrexate 2.5 MG tablet Commonly known as: RHEUMATREX Take 20 mg by mouth once a week.   ondansetron  4 MG disintegrating tablet Commonly known as: ZOFRAN -ODT Take 4 mg by mouth every 8 (eight) hours as needed for nausea or vomiting.   Ozempic (1 MG/DOSE) 4 MG/3ML Sopn Generic drug: Semaglutide (1 MG/DOSE) Inject 0.75 mLs into the skin once a week.   rosuvastatin  40 MG tablet Commonly known as: CRESTOR  Take 1 tablet (40 mg total) by mouth daily.   tadalafil 20 MG tablet Commonly known as: CIALIS Take 20 mg by mouth.    tamsulosin 0.4 MG Caps capsule Commonly known as: FLOMAX Take 0.4 mg by mouth daily.   temazepam 30 MG capsule Commonly known as: RESTORIL Take 30 mg by mouth at bedtime as needed.   traMADol 50 MG tablet Commonly known as: ULTRAM Take 50 mg by mouth 2 (two) times daily.   zolpidem  10 MG tablet Commonly known as: AMBIEN  Take 10 mg by mouth at bedtime.         Outstanding Labs/Studies  N/A  Duration of Discharge Encounter: APP Time: 30 minutes   Signed, Tami Blass H Anmol Fleck, PA-C 09/23/2024, 8:46 AM

## 2024-09-23 NOTE — Plan of Care (Signed)

## 2024-09-24 LAB — LIPOPROTEIN A (LPA): Lipoprotein (a): 48.8 nmol/L — ABNORMAL HIGH (ref <75.0)

## 2024-09-29 ENCOUNTER — Other Ambulatory Visit: Payer: Self-pay | Admitting: Emergency Medicine

## 2024-09-30 ENCOUNTER — Other Ambulatory Visit: Payer: Self-pay | Admitting: *Deleted

## 2024-09-30 DIAGNOSIS — I2102 ST elevation (STEMI) myocardial infarction involving left anterior descending coronary artery: Secondary | ICD-10-CM

## 2024-10-07 ENCOUNTER — Encounter: Payer: Self-pay | Admitting: Medical

## 2024-10-07 ENCOUNTER — Ambulatory Visit: Attending: Medical | Admitting: Medical

## 2024-10-07 VITALS — BP 128/72 | HR 62 | Ht 71.0 in | Wt 240.6 lb

## 2024-10-07 DIAGNOSIS — I350 Nonrheumatic aortic (valve) stenosis: Secondary | ICD-10-CM

## 2024-10-07 DIAGNOSIS — I255 Ischemic cardiomyopathy: Secondary | ICD-10-CM

## 2024-10-07 DIAGNOSIS — E782 Mixed hyperlipidemia: Secondary | ICD-10-CM

## 2024-10-07 DIAGNOSIS — I25118 Atherosclerotic heart disease of native coronary artery with other forms of angina pectoris: Secondary | ICD-10-CM

## 2024-10-07 DIAGNOSIS — K5903 Drug induced constipation: Secondary | ICD-10-CM | POA: Diagnosis not present

## 2024-10-07 DIAGNOSIS — Z79899 Other long term (current) drug therapy: Secondary | ICD-10-CM

## 2024-10-07 DIAGNOSIS — I10 Essential (primary) hypertension: Secondary | ICD-10-CM

## 2024-10-07 LAB — HELIX PHARMACOGENOMICS (PGX) CLOPIDOGREL TEST

## 2024-10-07 NOTE — Progress Notes (Signed)
 Cardiology Office Note   Date:  10/07/2024  ID:  Bobby Tanner, DOB [birth year], MRN [id number] PCP: Bobby Oneil FALCON, MD  [facility] Providers Cardiologist:  Bobby Cage, MD   History of Present Illness Bobby Tanner is a 73 y.o. male  with a history of CAD s/p PCI LAD in the setting of anterior STEMI and VF arrest and DES x 1 RCA 08/2024, ICM with recovered EF, mild AS, HLD, HTN who presents for follow-up of LHC.   His last Cath in 2022 showed a 60% moderate stenosis of the proximal RCA, 40% 1st diag, widely patent LAD stent. Medical therapy was recommended. Echo 2024 showed LVEF 60-65%, no WMA, mild MD, mild AI.   The patient presented to the clinic 09/15/24 with progressive anginal symptoms over the last few months. He had rest pain a few days prior. He was ultimately set up for a left heart cath. The patient presented 09/22/24 for scheduled cath procedure. [facility] showed widely patent LAD stent with no significant restenosis, significant progression of pRCA stenosis. He was treated with PCI/DES of the RCA. This was complicated by slow flow that improved significantly with intracoronary adenosine. He was admitted post-procedure overnight due to mild residual chest pain, started on nitroglycerin  drip. He was started on DAPT with ASA and Plavix  for at least 6 months.   Today, the patient reports he is overall doing well. He is back to normal function. He has some trouble breathing at times. He reports breathing issues with Imdur  and Crestor  if he takes it during the day, but breathing is good if he take them at night. He will exercise at home. He has lost 45lbs with diet changes. He takes lasix  as needed.   Studies Reviewed EKG Interpretation Date/Time:  Tuesday October 07 2024 08:27:59 EST Ventricular Rate:  62 PR Interval:  160 QRS Duration:  98 QT Interval:  418 QTC Calculation: 424 R Axis:   -66  Text Interpretation: Normal sinus rhythm Left axis deviation Low voltage QRS  Inferior infarct (cited on or before 07-Oct-2024) Possible Anterolateral infarct (cited on or before 07-Oct-2024) When compared with ECG of 23-Sep-2024 06:11, Questionable change in QRS duration Questionable change in initial forces of Anterolateral leads Confirmed by Franchester, Bobby Tanner ([id number]) on 10/07/2024 8:31:56 AM    LHC 08/2024    1st Diag lesion is 40% stenosed.   Prox RCA to Mid RCA lesion is 90% stenosed.   Dist LAD lesion is 50% stenosed.   Non-stenotic Prox LAD to Mid LAD lesion was previously treated.   A drug-eluting stent was successfully placed using a STENT ONYX FRONTIER 4.0X26.   Post intervention, there is a 0% residual stenosis.   In the absence of any other complications or medical issues, we expect the patient to be ready for discharge from an interventional cardiology perspective on 09/23/2024.   Recommend uninterrupted dual antiplatelet therapy with Aspirin  81mg  daily and Clopidogrel  75mg  daily for a minimum of 6 months (stable ischemic heart disease-Class I recommendation).   1.  Widely patent LAD stent with no significant restenosis.  Significant progression of proximal RCA stenosis. 2.  Left ventricular angiography was not performed.  LVEDP was minimally elevated at 13 mmHg. 3.  Successful OCT guided direct stenting of the right coronary artery.  This was complicated by symptomatic slow flow that improved significantly with intracoronary adenosine.   Recommendations: Will monitor the patient overnight and start nitroglycerin  drip given mild residual chest pain.   Intervention  Echo 02/2023  1. Left ventricular ejection fraction, by estimation, is 60 to 65%. The  left ventricle has normal function. The left ventricle has no regional  wall motion abnormalities. Left ventricular diastolic parameters were  normal.   2. Right ventricular systolic function is normal. The right ventricular  size is normal.   3. Left atrial size was mildly dilated.   4. The mitral  valve is normal in structure. Mild mitral valve  regurgitation.   5. The aortic valve is calcified. Aortic valve regurgitation is mild.  Mild aortic valve stenosis.   6. The inferior vena cava is normal in size with greater than 50%  respiratory variability, suggesting right atrial pressure of 3 mmHg.   Comparison(s): 07/02/19 60-65, mild LVH, BAE, mild AI.    Physical Exam VS:  BP 128/72 (BP Location: Right Wrist, Patient Position: Sitting, Cuff Size: Normal)   Pulse 62 Comment: 65 oximeter  Ht 5' 11 (1.803 m)   Wt 240 lb 9.6 oz (109.1 kg)   SpO2 98%   BMI 33.56 kg/m        Wt Readings from Last 3 Encounters:  10/07/24 240 lb 9.6 oz (109.1 kg)  09/22/24 243 lb (110.2 kg)  09/15/24 248 lb (112.5 kg)    GEN: Well nourished, well developed in no acute distress NECK: No JVD; No carotid bruits CARDIAC: RRR, + murmur, no rubs, gallops RESPIRATORY:  Clear to auscultation without rales, wheezing or rhonchi  ABDOMEN: Soft, non-tender, non-distended EXTREMITIES:  No edema; No deformity   ASSESSMENT AND PLAN  CAD LHC showed prox RCA to mid RCA lesion 90% stenosis, dist LAD lesion 50% stenosis, 1st diag 40% stenosis, widely patent LAD stent,  non-stenotic pLAD to mid LAD lesion previously treated. DES was placed in the RCA. He was started on DAPT with ASA and Plavix  for at least 6 months. He had symptomatic slow flow that improved significantly with intracoronary adenosis. He was kept overnight with no other complications. The patient is overall doing well. Cath site, right radial, has healed well. Cardiac rehab is too expensive, so he will do exercise at home. He has lost about 45lbs with diet changes. He reports minimal SOB when he takes Imdur  and Crestor  during the day, but not at night. Continue DAPT with ASA and Plavix  for at least 6 months. Continue Crestor , Imdur , SL NTG. I will check CBC.   ICM with improvement of EF Echo from 2024 showed LVEF 60-65%, mild MR/AI/AS. He is  euvolemic on exam. Continue lasix  as needed.    AS Echo from 2024 sowed normal LVEF and mild AS. We can re-check at follow-up, April 2026.   HTN BP is better with increase of Imdur . Continue Imdur  60mg  daily.   HLD LDL 68. LP (a) 48. Continue Crestor  40mg  daily. Can re-check lipids at follow-up.     Cardiac Rehabilitation Eligibility Assessment  The patient has declined or is not appropriate for cardiac rehabilitation.       Dispo: follow-up in 3 months  Signed, Militza Devery VEAR Fishman, PA-C

## 2024-10-07 NOTE — Patient Instructions (Signed)
 Medication Instructions:  Your physician recommends that you continue on your current medications as directed. Please refer to the Current Medication list given to you today.    *If you need a refill on your cardiac medications before your next appointment, please call your pharmacy*  Lab Work: Your provider would like for you to have following labs drawn today CBC.     Testing/Procedures: No test ordered today   Follow-Up: At [facility], you and your health needs are our priority.  As part of our continuing mission to provide you with exceptional heart care, our providers are all part of one team.  This team includes your primary Cardiologist (physician) and Advanced Practice Providers or APPs (Physician Assistants and Nurse Practitioners) who all work together to provide you with the care you need, when you need it.  Your next appointment:   3 month(s)  Provider:   You may see Deatrice Cage, MD or one of the following Advanced Practice Providers on your designated Care Team:   Cadence Trenton, PA-C

## 2024-10-08 ENCOUNTER — Ambulatory Visit: Payer: Self-pay | Admitting: Medical

## 2024-10-08 LAB — CBC
Hematocrit: 44.6 % (ref 37.5–51.0)
Hemoglobin: 14.5 g/dL (ref 13.0–17.7)
MCH: 30.8 pg (ref 26.6–33.0)
MCHC: 32.5 g/dL (ref 31.5–35.7)
MCV: 95 fL (ref 79–97)
Platelets: 178 x10E3/uL (ref 150–450)
RBC: 4.71 x10E6/uL (ref 4.14–5.80)
RDW: 13.5 % (ref 11.6–15.4)
WBC: 8.3 x10E3/uL (ref 3.4–10.8)

## 2024-10-10 DIAGNOSIS — G4733 Obstructive sleep apnea (adult) (pediatric): Secondary | ICD-10-CM | POA: Diagnosis not present

## 2024-10-18 DIAGNOSIS — G4733 Obstructive sleep apnea (adult) (pediatric): Secondary | ICD-10-CM | POA: Diagnosis not present

## 2024-11-05 DIAGNOSIS — D485 Neoplasm of uncertain behavior of skin: Secondary | ICD-10-CM | POA: Diagnosis not present

## 2024-11-05 DIAGNOSIS — D2272 Melanocytic nevi of left lower limb, including hip: Secondary | ICD-10-CM | POA: Diagnosis not present

## 2024-11-05 DIAGNOSIS — L570 Actinic keratosis: Secondary | ICD-10-CM | POA: Diagnosis not present

## 2024-11-05 DIAGNOSIS — Z872 Personal history of diseases of the skin and subcutaneous tissue: Secondary | ICD-10-CM | POA: Diagnosis not present

## 2024-11-05 DIAGNOSIS — D2271 Melanocytic nevi of right lower limb, including hip: Secondary | ICD-10-CM | POA: Diagnosis not present

## 2024-11-05 DIAGNOSIS — Z85828 Personal history of other malignant neoplasm of skin: Secondary | ICD-10-CM | POA: Diagnosis not present

## 2024-11-05 DIAGNOSIS — Z09 Encounter for follow-up examination after completed treatment for conditions other than malignant neoplasm: Secondary | ICD-10-CM | POA: Diagnosis not present

## 2024-11-05 DIAGNOSIS — Z08 Encounter for follow-up examination after completed treatment for malignant neoplasm: Secondary | ICD-10-CM | POA: Diagnosis not present

## 2024-11-05 DIAGNOSIS — D2262 Melanocytic nevi of left upper limb, including shoulder: Secondary | ICD-10-CM | POA: Diagnosis not present

## 2024-11-05 DIAGNOSIS — L821 Other seborrheic keratosis: Secondary | ICD-10-CM | POA: Diagnosis not present

## 2024-11-05 DIAGNOSIS — D2261 Melanocytic nevi of right upper limb, including shoulder: Secondary | ICD-10-CM | POA: Diagnosis not present

## 2024-11-05 DIAGNOSIS — D225 Melanocytic nevi of trunk: Secondary | ICD-10-CM | POA: Diagnosis not present

## 2025-01-07 ENCOUNTER — Ambulatory Visit: Admitting: Cardiovascular Disease
